# Patient Record
Sex: Female | Born: 1950
Health system: Southern US, Community
[De-identification: ages and names within clinical notes are randomized; demographics above are authoritative.]

## PROBLEM LIST (undated history)

## (undated) ENCOUNTER — Emergency Department (HOSPITAL_BASED_OUTPATIENT_CLINIC_OR_DEPARTMENT_OTHER): Admission: EM

## (undated) DIAGNOSIS — C4490 Unspecified malignant neoplasm of skin, unspecified: Secondary | ICD-10-CM

## (undated) DIAGNOSIS — K219 Gastro-esophageal reflux disease without esophagitis: Secondary | ICD-10-CM

## (undated) DIAGNOSIS — G250 Essential tremor: Secondary | ICD-10-CM

## (undated) DIAGNOSIS — F32A Depression, unspecified: Secondary | ICD-10-CM

## (undated) DIAGNOSIS — F419 Anxiety disorder, unspecified: Secondary | ICD-10-CM

## (undated) DIAGNOSIS — M810 Age-related osteoporosis without current pathological fracture: Secondary | ICD-10-CM

## (undated) DIAGNOSIS — K449 Diaphragmatic hernia without obstruction or gangrene: Secondary | ICD-10-CM

## (undated) DIAGNOSIS — R42 Dizziness and giddiness: Secondary | ICD-10-CM

## (undated) DIAGNOSIS — R252 Cramp and spasm: Secondary | ICD-10-CM

## (undated) DIAGNOSIS — K589 Irritable bowel syndrome without diarrhea: Secondary | ICD-10-CM

## (undated) DIAGNOSIS — M8580 Other specified disorders of bone density and structure, unspecified site: Secondary | ICD-10-CM

## (undated) DIAGNOSIS — T675XXA Heat exhaustion, unspecified, initial encounter: Secondary | ICD-10-CM

## (undated) DIAGNOSIS — M4800 Spinal stenosis, site unspecified: Secondary | ICD-10-CM

## (undated) DIAGNOSIS — T7840XA Allergy, unspecified, initial encounter: Secondary | ICD-10-CM

## (undated) DIAGNOSIS — K2270 Barrett's esophagus without dysplasia: Secondary | ICD-10-CM

## (undated) DIAGNOSIS — E785 Hyperlipidemia, unspecified: Secondary | ICD-10-CM

## (undated) DIAGNOSIS — M545 Low back pain, unspecified: Secondary | ICD-10-CM

## (undated) DIAGNOSIS — M5126 Other intervertebral disc displacement, lumbar region: Secondary | ICD-10-CM

## (undated) DIAGNOSIS — Z87440 Personal history of urinary (tract) infections: Secondary | ICD-10-CM

## (undated) DIAGNOSIS — M1990 Unspecified osteoarthritis, unspecified site: Secondary | ICD-10-CM

## (undated) DIAGNOSIS — Z9289 Personal history of other medical treatment: Secondary | ICD-10-CM

## (undated) DIAGNOSIS — K635 Polyp of colon: Secondary | ICD-10-CM

## (undated) DIAGNOSIS — M797 Fibromyalgia: Secondary | ICD-10-CM

## (undated) DIAGNOSIS — N8110 Cystocele, unspecified: Secondary | ICD-10-CM

## (undated) DIAGNOSIS — R251 Tremor, unspecified: Secondary | ICD-10-CM

## (undated) DIAGNOSIS — F329 Major depressive disorder, single episode, unspecified: Secondary | ICD-10-CM

## (undated) DIAGNOSIS — M720 Palmar fascial fibromatosis [Dupuytren]: Secondary | ICD-10-CM

## (undated) DIAGNOSIS — H04123 Dry eye syndrome of bilateral lacrimal glands: Secondary | ICD-10-CM

## (undated) DIAGNOSIS — D219 Benign neoplasm of connective and other soft tissue, unspecified: Secondary | ICD-10-CM

## (undated) DIAGNOSIS — M069 Rheumatoid arthritis, unspecified: Secondary | ICD-10-CM

## (undated) DIAGNOSIS — M5020 Other cervical disc displacement, unspecified cervical region: Secondary | ICD-10-CM

## (undated) HISTORY — DX: Heat exhaustion, unspecified, initial encounter: T67.5XXA

## (undated) HISTORY — DX: Personal history of other medical treatment: Z92.89

## (undated) HISTORY — DX: Age-related osteoporosis without current pathological fracture: M81.0

## (undated) HISTORY — DX: Anxiety disorder, unspecified: F41.9

## (undated) HISTORY — DX: Benign neoplasm of connective and other soft tissue, unspecified: D21.9

## (undated) HISTORY — DX: Palmar fascial fibromatosis (dupuytren): M72.0

## (undated) HISTORY — DX: Low back pain: M54.5

## (undated) HISTORY — DX: Unspecified malignant neoplasm of skin, unspecified: C44.90

## (undated) HISTORY — DX: Irritable bowel syndrome, unspecified: K58.9

## (undated) HISTORY — DX: Tremor, unspecified: R25.1

## (undated) HISTORY — DX: Low back pain, unspecified: M54.50

## (undated) HISTORY — DX: Cramp and spasm: R25.2

## (undated) HISTORY — DX: Cystocele, unspecified: N81.10

## (undated) HISTORY — DX: Hyperlipidemia, unspecified: E78.5

## (undated) HISTORY — DX: Dizziness and giddiness: R42

## (undated) HISTORY — DX: Gastro-esophageal reflux disease without esophagitis: K21.9

## (undated) HISTORY — DX: Rheumatoid arthritis, unspecified: M06.9

## (undated) HISTORY — DX: Allergy, unspecified, initial encounter: T78.40XA

## (undated) HISTORY — DX: Diaphragmatic hernia without obstruction or gangrene: K44.9

## (undated) HISTORY — DX: Other specified disorders of bone density and structure, unspecified site: M85.80

## (undated) HISTORY — DX: Dry eye syndrome of bilateral lacrimal glands: H04.123

## (undated) HISTORY — DX: Major depressive disorder, single episode, unspecified: F32.9

## (undated) HISTORY — DX: Unspecified osteoarthritis, unspecified site: M19.90

## (undated) HISTORY — DX: Other cervical disc displacement, unspecified cervical region: M50.20

## (undated) HISTORY — DX: Essential tremor: G25.0

## (undated) HISTORY — DX: Polyp of colon: K63.5

## (undated) HISTORY — DX: Fibromyalgia: M79.7

## (undated) HISTORY — DX: Barrett's esophagus without dysplasia: K22.70

## (undated) HISTORY — DX: Spinal stenosis, site unspecified: M48.00

## (undated) HISTORY — DX: Depression, unspecified: F32.A

## (undated) HISTORY — DX: Other intervertebral disc displacement, lumbar region: M51.26

## (undated) HISTORY — DX: Personal history of urinary (tract) infections: Z87.440

## (undated) MED FILL — Medication: Fill #1 | Status: CN

---

## 2000-05-12 HISTORY — PX: CERVICAL LAMINECTOMY: SHX94

## 2000-08-03 ENCOUNTER — Encounter: Payer: Self-pay | Admitting: Family Medicine

## 2000-08-03 ENCOUNTER — Encounter: Admission: RE | Admit: 2000-08-03 | Discharge: 2000-08-03 | Payer: Self-pay | Admitting: Family Medicine

## 2001-03-12 ENCOUNTER — Encounter: Admission: RE | Admit: 2001-03-12 | Discharge: 2001-03-12 | Payer: Self-pay | Admitting: Family Medicine

## 2001-03-12 ENCOUNTER — Encounter: Payer: Self-pay | Admitting: Family Medicine

## 2001-05-12 HISTORY — PX: ABDOMINAL HYSTERECTOMY: SHX81

## 2002-03-03 ENCOUNTER — Inpatient Hospital Stay (HOSPITAL_COMMUNITY): Admission: RE | Admit: 2002-03-03 | Discharge: 2002-03-05 | Payer: Self-pay | Admitting: Obstetrics and Gynecology

## 2002-03-03 ENCOUNTER — Encounter (INDEPENDENT_AMBULATORY_CARE_PROVIDER_SITE_OTHER): Payer: Self-pay

## 2003-03-06 ENCOUNTER — Other Ambulatory Visit: Admission: RE | Admit: 2003-03-06 | Discharge: 2003-03-06 | Payer: Self-pay | Admitting: Obstetrics and Gynecology

## 2003-10-06 ENCOUNTER — Ambulatory Visit (HOSPITAL_COMMUNITY): Admission: RE | Admit: 2003-10-06 | Discharge: 2003-10-06 | Payer: Self-pay | Admitting: Obstetrics and Gynecology

## 2005-08-04 ENCOUNTER — Other Ambulatory Visit: Admission: RE | Admit: 2005-08-04 | Discharge: 2005-08-04 | Payer: Self-pay | Admitting: Obstetrics and Gynecology

## 2006-03-31 ENCOUNTER — Encounter: Admission: RE | Admit: 2006-03-31 | Discharge: 2006-03-31 | Payer: Self-pay | Admitting: Cardiology

## 2006-05-12 HISTORY — PX: CHOLECYSTECTOMY: SHX55

## 2006-05-19 ENCOUNTER — Ambulatory Visit: Payer: Self-pay | Admitting: Gastroenterology

## 2006-05-21 ENCOUNTER — Ambulatory Visit: Payer: Self-pay | Admitting: Gastroenterology

## 2006-06-09 ENCOUNTER — Ambulatory Visit: Payer: Self-pay | Admitting: Gastroenterology

## 2006-06-12 ENCOUNTER — Ambulatory Visit: Payer: Self-pay | Admitting: Gastroenterology

## 2006-08-11 ENCOUNTER — Ambulatory Visit: Payer: Self-pay | Admitting: Gastroenterology

## 2006-08-18 ENCOUNTER — Ambulatory Visit: Payer: Self-pay | Admitting: Psychiatry

## 2006-08-25 ENCOUNTER — Ambulatory Visit: Payer: Self-pay | Admitting: Psychiatry

## 2006-09-02 ENCOUNTER — Ambulatory Visit: Payer: Self-pay | Admitting: Psychiatry

## 2006-09-09 ENCOUNTER — Ambulatory Visit: Payer: Self-pay | Admitting: Psychiatry

## 2006-09-17 ENCOUNTER — Ambulatory Visit: Payer: Self-pay | Admitting: Psychiatry

## 2006-09-30 ENCOUNTER — Ambulatory Visit: Payer: Self-pay | Admitting: Psychiatry

## 2006-10-12 ENCOUNTER — Ambulatory Visit: Payer: Self-pay | Admitting: Psychiatry

## 2006-10-26 ENCOUNTER — Ambulatory Visit: Payer: Self-pay | Admitting: Psychiatry

## 2006-11-09 ENCOUNTER — Ambulatory Visit: Payer: Self-pay | Admitting: Psychiatry

## 2006-12-07 ENCOUNTER — Ambulatory Visit: Payer: Self-pay | Admitting: Psychiatry

## 2008-07-05 DIAGNOSIS — K5900 Constipation, unspecified: Secondary | ICD-10-CM | POA: Insufficient documentation

## 2008-07-05 DIAGNOSIS — K2990 Gastroduodenitis, unspecified, without bleeding: Secondary | ICD-10-CM

## 2008-07-05 DIAGNOSIS — K8020 Calculus of gallbladder without cholecystitis without obstruction: Secondary | ICD-10-CM | POA: Insufficient documentation

## 2008-07-05 DIAGNOSIS — K449 Diaphragmatic hernia without obstruction or gangrene: Secondary | ICD-10-CM | POA: Insufficient documentation

## 2008-07-05 DIAGNOSIS — K219 Gastro-esophageal reflux disease without esophagitis: Secondary | ICD-10-CM

## 2008-07-05 DIAGNOSIS — K2970 Gastritis, unspecified, without bleeding: Secondary | ICD-10-CM | POA: Insufficient documentation

## 2008-07-11 ENCOUNTER — Ambulatory Visit: Payer: Self-pay | Admitting: Gastroenterology

## 2008-07-11 DIAGNOSIS — F419 Anxiety disorder, unspecified: Secondary | ICD-10-CM | POA: Insufficient documentation

## 2008-07-17 ENCOUNTER — Ambulatory Visit: Payer: Self-pay | Admitting: Gastroenterology

## 2008-07-17 ENCOUNTER — Encounter: Payer: Self-pay | Admitting: Gastroenterology

## 2008-07-18 ENCOUNTER — Encounter: Payer: Self-pay | Admitting: Gastroenterology

## 2008-07-20 ENCOUNTER — Telehealth: Payer: Self-pay | Admitting: Gastroenterology

## 2008-08-03 ENCOUNTER — Telehealth: Payer: Self-pay | Admitting: Gastroenterology

## 2008-09-15 ENCOUNTER — Telehealth: Payer: Self-pay | Admitting: Gastroenterology

## 2009-05-12 HISTORY — PX: ROTATOR CUFF REPAIR: SHX139

## 2009-07-02 ENCOUNTER — Encounter: Admission: RE | Admit: 2009-07-02 | Discharge: 2009-07-02 | Payer: Self-pay | Admitting: Rheumatology

## 2009-11-21 ENCOUNTER — Telehealth: Payer: Self-pay | Admitting: Gastroenterology

## 2009-12-14 ENCOUNTER — Telehealth: Payer: Self-pay | Admitting: Gastroenterology

## 2009-12-17 ENCOUNTER — Ambulatory Visit (HOSPITAL_COMMUNITY): Admission: RE | Admit: 2009-12-17 | Discharge: 2009-12-17 | Payer: Self-pay | Admitting: Gastroenterology

## 2009-12-20 ENCOUNTER — Encounter: Payer: Self-pay | Admitting: Gastroenterology

## 2009-12-24 ENCOUNTER — Ambulatory Visit: Payer: Self-pay | Admitting: Gastroenterology

## 2009-12-24 ENCOUNTER — Telehealth: Payer: Self-pay | Admitting: Gastroenterology

## 2010-04-25 ENCOUNTER — Ambulatory Visit (HOSPITAL_COMMUNITY)
Admission: RE | Admit: 2010-04-25 | Discharge: 2010-04-26 | Payer: Self-pay | Source: Home / Self Care | Attending: Specialist | Admitting: Specialist

## 2010-06-02 ENCOUNTER — Encounter: Payer: Self-pay | Admitting: Rheumatology

## 2010-06-11 NOTE — Progress Notes (Signed)
Summary: Triage  Phone Note Call from Patient Call back at Home Phone [phone]   Caller: Patient Call For: Dr. Jarold Motto Reason for Call: Talk to Nurse Summary of Call: pt. is having alot of acid reflux and trouble keeping her throat clear. She has taken Aciphex and Nexium and had side effects. Is there anything else that be prescribed? Initial call taken by: Karna Christmas,  November 21, 2009 9:36 AM  Follow-up for Phone Call        Line busy.  Lupita Leash Surface RN  November 21, 2009 9:44 AM  Lm for pt to call.  Lupita Leash Surface RN  November 21, 2009 10:37 AM  Pt was diag with rheumatoid arthritis in Feb.  She is now on prednisone 7.5 mg daily and MTX 20 mg weekly.  Having increase in reflux.  Feels that shehas to clear her throat alot.  Currently taking Zantac OTC 75 mg two times a day.  This helps sme but asks if there is something better that she can try.? She has tried numerous PPI's in the past.  Nexium, Aciphex, Zegerid, Prevacid, Dexilant.  All have either not helped or she had a reaction to the med.  has never tried protonix that she can remember. Follow-up by: Ashok Cordia RN,  November 21, 2009 11:56 AM  Additional Follow-up for Phone Call Additional follow up Details #1::        zantac 300mg  two times a day,prn liquid carafate...manometry abd ph probe needed... Additional Follow-up by: Mardella Layman MD FACG,  November 21, 2009 1:55 PM    Additional Follow-up for Phone Call Additional follow up Details #2::    Pt notified. Rx sent.    Lupita Leash Surface RN  November 21, 2009 3:33 PM  Manometry and 24 hr ph sch for Aug 1 at 9:00.  Does pt need to stop zantac and carafate? Follow-up by: Ashok Cordia RN,  November 22, 2009 12:50 PM  Additional Follow-up for Phone Call Additional follow up Details #3:: Details for Additional Follow-up Action Taken: 72h before on zantac and 24h on carafate Additional Follow-up by: Mardella Layman MD Clementeen Graham,  November 23, 2009 8:32 AM  New/Updated Medications: ZANTAC 300  MG TABS (RANITIDINE HCL) 1 by mouth two times a day CARAFATE 1 GM/10ML  SUSP (SUCRALFATE) 1 tbsp qid as needed Prescriptions: CARAFATE 1 GM/10ML  SUSP (SUCRALFATE) 1 tbsp qid as needed  #14 OZ x 3   Entered by:   Ashok Cordia RN   Authorized by:   Mardella Layman MD [facility]   Signed by:   Ashok Cordia RN on 11/21/2009   Method used:   Electronically to        [facility]  [address] [address] (retail[facility]       [address]       [place], [place]  [address]       Ph: [phone] or [phone]       Fax: [phone]   RxID:   [id number] ZANTAC 300 MG TABS (RANITIDINE HCL) 1 by mouth two times a day  #60 x 11   Entered by:   Ashok Cordia RN   Authorized by:   Mardella Layman MD [facility]   Signed by:   Ashok Cordia RN on 11/21/2009   Method used:   [facility]ronically [facility]        [facility]  [address] [phone][facility] (retail)       [address]  [place], [place]  [address]       Ph: [phone] or [phone]       Fax: [phone]   RxID:   [id number]   Appended Document: Triage LM for pt re date.  Instructions mailed to pt.  Appended Document: Triage Pt asks if manom/ph can be changed.  [birth year].  Proc resch to 12/17/09.  Pt notified.

## 2010-06-11 NOTE — Procedures (Signed)
Summary: manometry   Esophageal Manometry  Procedure date:  12/20/2009  Findings:      normal:   Esophageal manometry was completed on December 17, 2009. Salt are as follows:  #1 upper esophageal sphincter-there is normal coordination between pharyngeal contraction and cricopharyngeal relaxation.  #2-lower esophageal sphincter-mean pressure is normal at 30 mm of mercury with normal relaxation swallowing.  #3-esophageal motility-there are normally propagated peristaltic waves throughout the length of the esophagus both wet and dry swallows. Mean amplitude of duration as 183 mmHg. There is 100% peristalsis.  Assessment this a normal esophageal manometry without evidence of an esophageal motility disorder.

## 2010-06-11 NOTE — Progress Notes (Signed)
Summary: ? Meds  Phone Note Call from Patient Call back at Home Phone [phone]   Caller: Patient Call For: Dr. Jarold Motto Reason for Call: Talk to Nurse Details for Reason: ? Meds Summary of Call: Pt. has a question about taking her meds before her manometry on Monday. Please call. Initial call taken by: Schuyler Amor,  December 14, 2009 10:39 AM  Follow-up for Phone Call        Pt asks if she can take her paxil the morning of proc.  Per instructions pt should be NPO after midnight.  Pt instructed to hold meds until after proc. Follow-up by: Ashok Cordia RN,  December 14, 2009 10:48 AM

## 2010-06-11 NOTE — Progress Notes (Signed)
Summary: Results of Manometry and Ph study  Phone Note Outgoing Call   Summary of Call: Per Dr. Jarold Motto,  Manometry and 24 hr PH study normal.  No evidence that acid refulx is causing pt's symptoms.  Pt should follow up with PCP if problems contunue. Initial call taken by: Ashok Cordia RN,  December 24, 2009 4:43 PM  Follow-up for Phone Call        LM for pt to call.  Lupita Leash Surface RN  December 26, 2009 9:15 AM  Pt notified of resuts.  Pt states she is doing mubh better in the zantac 300 two times a day and carafate as needed. She will cont these meds. Follow-up by: Ashok Cordia RN,  December 26, 2009 12:04 PM

## 2010-06-11 NOTE — Procedures (Signed)
Summary: 24 Hr Ph study   pH Probe Study  Procedure date:  12/20/2009  Findings:      Transnasal:  Location: [facility]   24-hour pH chest was completed on August 11. This report with proximal and distal throat is entirely normal without any evidence of acid reflux in either probe in any position. DeMeester score is 3.2 normal less than 22. There are no reflux episodes longer than 2 minutes and the time the pH is less than 4 is normal in all positions. She has multiple recordings of belching but no correlation of symptoms and acid reflux occurrence.  Assessment there is no evidence whatsoever of acid reflux be involved in this patient's symptomatology. We'll return her the care of her primary care physician.

## 2010-07-23 LAB — CBC
Hemoglobin: 13.8 g/dL (ref 12.0–15.0)
MCV: 100.2 fL — ABNORMAL HIGH (ref 78.0–100.0)
Platelets: 212 10*3/uL (ref 150–400)
RDW: 13 % (ref 11.5–15.5)
WBC: 4.7 10*3/uL (ref 4.0–10.5)

## 2010-07-23 LAB — BASIC METABOLIC PANEL
BUN: 10 mg/dL (ref 6–23)
CO2: 30 mEq/L (ref 19–32)
Calcium: 9.2 mg/dL (ref 8.4–10.5)
Creatinine, Ser: 0.65 mg/dL (ref 0.4–1.2)
GFR calc non Af Amer: >60 mL/min (ref >60)
Glucose, Bld: 92 mg/dL (ref 70–99)

## 2010-09-27 NOTE — Discharge Summary (Signed)
NAME:  Angela Ray, Angela Ray                      ACCOUNT NO.:  [id number]   MEDICAL RECORD NO.:  [id number]                   PATIENT TYPE:  INP   LOCATION:  9307                                 FACILITY:  [facility]   PHYSICIAN:  Randye Lobo, M.D.                DATE OF BIRTH:  May 11, 1951   DATE OF ADMISSION:  03/03/2002  DATE OF DISCHARGE:  03/05/2002                                 DISCHARGE SUMMARY   ADMISSION DIAGNOSIS:  Symptomatic uterine leiomyomata.   DISCHARGE DIAGNOSES:  1. Symptomatic uterine leiomyomata.  2. Status post total abdominal hysterectomy.   SIGNIFICANT OPERATIONS AND PROCEDURES:  A total abdominal hysterectomy was  performed on March 03, 2002 at the [facility] under the direction of  Randye Lobo, M.D. and with the assistance of Dr. Lacretia Nicks. ________.   ADMISSION HISTORY AND PHYSICAL EXAMINATION:  The patient was a 60 year old,  gravida 0, Caucasian female who presented with heavy and frequent menses and  urinary urgency and the diagnosis of uterine leiomyomata by pelvic  ultrasound.  The patient wished for a definitive surgical treatment of the  fibroids.  Physical examination documented a uterine size which was 15 weeks  size and mobile with a large posterior lower uterine segment fibroid.  No  adnexal masses were appreciated.   HOSPITAL COURSE:  The patient was admitted for surgery on March 03, 2002.  The patient underwent a total abdominal hysterectomy under the direction of  Randye Lobo, M.D. and with the assistance of Dr. Lacretia Nicks. _________.  Physical  findings were an 8 cm posterior fundal subserosal fibroid which caused the  uterus to be retroverted.  There was also a 2 cm left fundal subserosal  fibroid appreciated.  The fallopian tubes and ovaries were normal  bilaterally.  On bilateral palpation of the upper abdomen, there appeared to  be some possible adhesions in the right upper quadrant just inferior to the  liver edge and these were not  moved.  The remainder of the intra-abdominal  organs were noted to be normal.   The patient's postoperative course was significant for nausea during the  immediate postop period which was partially attributed to the patient's  history of vertigo.  This did resolve with antiemetic therapy.   The remainder of the patient's postoperative course was unremarkable.  The  patient had no significant fevers during her hospitalization.  The patient  did advance to a regular diet which she was tolerating at the time of  discharge.  Her Foley catheter was removed on postoperative day #1 and she  was able to void spontaneously.  The patient had good pain control initially  with a morphine PCA which was converted over to Darvocet-N 100.  The patient  did use TED hose and PAS stockings during her hospitalization and was  ambulating well at the time of her discharge.   The incision remained clean, dry, and intact, and the  staples remained at  the time of discharge.   The patient's discharge hematocrit was noted to be 34.1% and she was  tolerating this well.  The final pathology report is pending at the time of  the patient's discharge.   The patient was found to be in good condition and ready for discharge on  postoperative day #2.    DISCHARGE INSTRUCTIONS:  1. Discharged to home.  2. Decreased activity for six weeks.  3. Prescriptions were given for Darvocet-N 100 one p.o. q.4-6 h. p.r.n. pain     and for Phenergan 25 mg p.o. q.6 h. p.r.n. nausea.  4. The patient will follow with a regular diet.  5. She will follow up in the office in four days for staple removal.  6. The patient will call if she experiences any fever, nausea and vomiting,     increased pain, incisional drainage or redness, or any other concern.                                               Randye Lobo, M.D.    BES/MEDQ  D:  03/05/2002  T:  03/07/2002  Job:  [id number]

## 2010-09-27 NOTE — Assessment & Plan Note (Signed)
[facility]                         GASTROENTEROLOGY OFFICE NOTE   NAME:Ray, Angela STAPLETON                   MRN:          [id number]  DATE:05/19/2006                            DOB:          06-22-1950    Angela Ray is a 60 year old white female, retired from [employer].  She is referred by Dr. Caryl Never for evaluation of nausea  and changing bowel habits.   I previously saw Angela Ray for abdominal pain, bloating and rather severe  constipation in June 2005.  At that time she had a negative colonoscopy  exam.  She has continued to be constipated since that time, but around  the death of aunt or on 04/09/2006, she developed nausea,  diarrhea, and rather classic panic attacks.  She is currently under the  care of a psychologist, and has been on Wellbutrin, Zoloft, is currently  on Paxil 20 mg a day.  She really denies depression, but has had rather  classic anxiety attacks with nausea, diarrhea, shakiness, tremors, and  feelings of impending doom.  Associated with this has been anorexia and  a 15-pound weight loss.  She has had some mild reflux symptoms.  Uses  p.r.n. Zantac, but denies dysphagia or any hepatobiliary complaints.  Over the last several days she has returned to her normal constipation  pattern.  She has never had melena, hematochezia, hematemesis, and she  denies abuse of alcohol, cigarettes or NSAIDS.  She does take lorazepam  0.5 mg at bedtime and p.r.n. promethazine.  She has had no known  infectious disease exposure, travel, and there are no sick family  members at home.   PAST MEDICAL HISTORY:  Remarkable for degenerative arthritis, previous  hysterectomy without oophorectomy, and back surgery for degenerative  spine disease.   FAMILY HISTORY:  Unremarkable in terms of known gastrointestinal  problems except for a vague history of ulcerative colitis in her mother.   MEDICATIONS:  1. Paxil 20 mg a day  with p.r.n. promethazine 25 mg.  2. Lorazepam 0.5 mg.  3. Zantac over the counter.   She has had multiple reactions to almost all ANALGESICS, ASPIRIN,  CEPHALEXIN, TETRACYCLINE, DECONGESTANTS.   SOCIAL HISTORY:  Patient is married and lives with her husband.  She has  a high school education.  She uses ethanol socially.   REVIEW OF SYSTEMS:  Remarkable for insomnia, palpitations associated  with her anxiety, pruritus, excessive urination, shortness of breath,  exertion and leg cramps at night.  She denies any other cardiovascular,  pulmonary, or neuropsychiatric problems.  She specifically denies  illusions or hallucinations.  Review of systems otherwise  noncontributory.   EXAMINATION:  She is a healthy appearing white female, appearing her  stated age, in no acute distress.  She is 6 feet tall and weighs 172 pounds.  Blood pressure is 92/64.  Pulse is 70 and regular.  I cannot  appreciate stigmata of chronic liver disease.  There is no thyromegaly noted.  CHEST:  Entirely clear to percussion and auscultation.  She appeared to  be in a regular rhythm without murmurs, gallops or rubs.  ABDOMINAL  EXAM:  Showed no distention, organomegaly, masses or  tenderness.  Her bowel sounds were entirely normal.  EXTREMITIES:  Unremarkable.  MENTAL STATUS:  Clear.  RECTAL:  Inspection of the rectum showed external hemorrhoidal tissue,  but no fissures or fistulae.  Rectal exam showed no masses or  tenderness.  There was soft stool in the rectal vault that was guaiac  negative.   ASSESSMENT:  I think that Angela Ray's symptoms are all directly  related to what sounds like a rather severe anxiety reaction and panic  attacks.  She has a long history of constipation, predominant irritable  bowel syndrome, and some of her symptoms may be related to some of her  recent medications.  I think it is unlikely that she has any other  definable gastrointestinal disorder at this time.    RECOMMENDATIONS:  1. Check abdominal ultrasound exam.  2. Trial of Nexium 40 mg q. a.m. 30 minutes before breakfast.  3. Discontinue lorazepam and try Klonopin 0.5 mg twice a day.  4. Continue Paxil therapy as tolerated.  5. GI followup in several weeks' time.  If she is still symptomatic,      we will probably consider endoscopic exam.     Angela Rea. Jarold Motto, MD, Caleen Essex, FAGA  Electronically Signed    DRP/MedQ  DD: 05/19/2006  DT: 05/19/2006  Job #: [id number]   cc:   Evelena Peat, M.D.  Gloriajean Dell. Andrey Campanile, M.D.

## 2010-09-27 NOTE — Assessment & Plan Note (Signed)
[facility]                         GASTROENTEROLOGY OFFICE NOTE   NAME:Ray, Angela STRAUCH                   MRN:          [id number]  DATE:08/11/2006                            DOB:          12-12-1950    PROBLEM LIST:  1. GERD.  2. Constipation.  3. Nausea.  4. Anxiety.   HISTORY:  Angela Ray comes in today for followup.  She was last seen by Dr.  Jarold Ray in January of 2008 and at that time was having a lot of  problem with nausea and altered bowel habits, with constipation and  occasional episodes of diarrhea.  She was also having fairly classic  panic attack symptoms.  She had been found to have cholelithiasis and  was in the process of being evaluated by surgery for possible  cholecystectomy.  From a GI standpoint, it was felt that she had  irritable bowel with chronic constipation and that a lot of her other  symptoms were likely related to anxiety.  She was tried on a trial of  Nexium and Klonopin and continued on Paxil.  She has subsequently  undergone cholecystectomy with Dr. Johna Ray which was done approximately  a month ago.  She did well post cholecystectomy, has not developed any  difficulty with post cholecystectomy diarrhea, etc., In fact says she is  still extremely constipated.  She tried the Nexium but feels that it  makes her very nauseated and also caused her an ulceration in taste  sensation and an irritation of her taste buds.  She stopped the Nexium  and has not been using a PPI, has been using over-the-counter Zantac  b.i.d. but says she is still having reflux symptoms.  She had been on  Aciphex several months ago and said that that did work.   As far as her constipation, she says she has not had a bowel movement in  the past 8 days and that she is the queen of constipation.  She had  used MiraLax previously but is not using it regularly.  She has been  afraid to get addicted to a laxative.  She eats a very high fiber  diet  in addition to pushing fluids and using Metamucil.   The patient is still having problems with anxiety.  She had tried to  wean off of her clonazepam at the advice of her primary care Angela Ray  but had a panic attack a few days ago and is now back on b.i.d.  Klonopin.  She is asking for advice regarding her anxiety.   CURRENT MEDICATIONS:  1. Paroxetine 20 mg daily.  2. Clonazepam 0.5 b.i.d.  3. Chlorhexidine mouthwash as needed.  4. Zantac b.i.d.   ALLERGIES:  MULTIPLE.  SEE PREVIOUS NOTES.   PHYSICAL EXAMINATION:  Well-developed white female in no acute distress.  She is talkative and somewhat anxious.  Weight is 166, blood pressure  124/80, pulse is 76.  HEENT:  Normocephalic and atraumatic.  EOMI.  PERRLA.  Sclerae  anicteric.  CARDIOVASCULAR:  Regular rate and rhythm with S1 and S2, no murmurs,  rubs or gallops.  PULMONARY:  Clear to A&P.  ABDOMEN:  Soft and nontender.  Her incisional ports are healing.  Bowel  sounds are active.   IMPRESSION:  1. Nausea possibly related to Nexium though this may also be a      function of her anxiety.  2. Taste alteration, again possible side effect of Nexium.  3. GERD.  4. Constipation/IBS.  5. Anxiety/panic attacks.   PLAN:  1. Discontinue Nexium and switch to Aciphex 20 mg p.o. daily.  She was      given a prescription and samples today.  2. Trial of Amitiza 24 mcg b.i.d. for her constipation.  If she does      well with this she can stay on it.  Otherwise would switch back to      MiraLax 17 grams every day.  3. Referral to Dr. Dellia Ray for counseling and I advised her to      continue the clonazepam.  She may try to wean to a once daily dose      and to continue her Paxil.      Mike Gip, PA-C  Electronically Signed      Vania Rea. Angela Motto, MD, Caleen Essex, FAGA  Electronically Signed   AE/MedQ  DD: 08/14/2006  DT: 08/14/2006  Job #: [id number]

## 2010-09-27 NOTE — Op Note (Signed)
NAME:  Angela Ray, Angela Ray                      ACCOUNT NO.:  [id number]   MEDICAL RECORD NO.:  [id number]                   PATIENT TYPE:  INP   LOCATION:  9399                                 FACILITY:  [facility]   PHYSICIAN:  Randye Lobo, M.D.                DATE OF BIRTH:  1950-10-05   DATE OF PROCEDURE:  03/03/2002  DATE OF DISCHARGE:                                 OPERATIVE REPORT   PREOPERATIVE DIAGNOSES:  Symptomatic uterine leiomyomata.   POSTOPERATIVE DIAGNOSES:  Symptomatic uterine leiomyomata.   PROCEDURE:  Total abdominal hysterectomy.   SURGEON:  Randye Lobo, M.D.   ASSISTANTLuvenia Redden, MD   ANESTHESIA:  MAC 4, general endotracheal.   IV FLUIDS:  2300 cc Ringer's lactate.   ESTIMATED BLOOD LOSS:  100 cc.   URINE OUTPUT:  300 cc.   COMPLICATIONS:  None.   INDICATIONS FOR PROCEDURE:  The patient was a 60 year old gravida 107  Caucasian female who presented to the office with heavy and frequent menses  and urinary urgency and a diagnosis of a pelvic mass which on CT scan and  pelvic ultrasound documented uterine fibroids, the largest measuring 8.5 cm  in diameter.  The ovaries were not visualized at the time of the imaging  studies.  The patient declined any future childbearing and she wished for  definitive surgical evaluation and treatment of the uterine fibroids and she  wished to retain the adnexal structures if they were identified to be normal  at the time of the surgery.  The patient chose to proceed with her surgery  after the risks, benefits, and alternatives were discussed with her.   FINDINGS:  Laparotomy demonstrated a uterus with an 8 cm posterior fundal  subserosal fibroid which was resting deep in the cul-de-sac region.  There  was also a 2 cm left fundal subserosal fibroid.  The fallopian tubes and  ovaries were normal bilaterally.  The appendix was visualized and noted to  be normal.  The liver, gallbladder, kidneys, and periaortic  regions were  palpably normal.  There appeared to be some possible adhesions in the right  upper quadrant just inferior to the liver edge.  These were not lysed.   SPECIMENS:  The uterus was sent to pathology.   PROCEDURE:  The patient was identified in the preoperative area and was  taken to the operating room with an IV in place and after receiving  cefazolin 1 g intravenously for antibiotic prophylaxis.  The patient did  receive ted hose and PAS stockings for DVT prophylaxis.  In the operating  room the patient was placed in the supine position on the operating room  table and the anesthesia team performed general endotracheal anesthesia.  The patient's abdomen and vagina were then sterilely prepped and a Foley  catheter was placed inside the bladder.  The patient was then sterilely  draped.   The procedure began  with a vertical midline incision created sharply with a  scalpel.  This was carried down to the rectus fascia using monopolar  cautery.  The fascia was then scored in a vertical fashion using a scalpel  and the incision was extended with a combination of sharp dissection with a  scalpel and a Mayo scissors and with monopolar cautery.  The rectus muscles  were then divided bluntly and the parietal peritoneum was identified and  grasped with two hemostat clamps and entered sharply with the Metzenbaum  scissors.  The incision in the peritoneum was extended cranially and  caudally.   An exploration of the pelvic and abdominal organs was performed and the  findings are as noted above.  A self retaining retractor was placed in the  peritoneal cavity and the bowel was packed into the upper abdomen using  moistened lap pads.  The procedure began by placing long Kelly clamps across  the adnexal structures bilaterally.  The round ligaments were then isolated,  suture ligated with transfixing sutures of 0 Vicryl and divided using  monopolar cautery.  The broad ligaments were then  opened using monopolar  cautery.  A window was created through the posterior leaf of the broad  ligament on each side and a Heaney clamp was placed across the fallopian  tube and utero-ovarian ligament.  This was then divided sharply on each side  and a free tie of 0 Vicryl followed by a suture ligature of the same was  placed.  Hemostasis was excellent bilaterally.  The bladder flap was then  created sharply with the Metzenbaum scissors.  The uterine arteries were  skeletonized bilaterally and curved Heaney clamps were placed across the  uterine arteries bilaterally.  They were then sharply divided and suture  ligated with 0 Vicryl suture for excellent hemostasis.  The remainder of the  cardinal and the uterosacral ligaments were then sequentially clamped,  sharply divided, and suture ligated with transfixing sutures of 0 Vicryl.  Curved Heaney clamps were then placed at the top of the vagina just below  the level of the cervix and the specimen was sharply excised and sent to  pathology.  Transfixing sutures of 0 Vicryl were then placed for angle  sutures and an additional figure-of-eight suture of 0 Vicryl was placed in  the midline of the vagina to close the vaginal cuff.   Irrigation of the pelvis was performed at this time and the operating sites  were examined and found to be hemostatic.  The abdomen was therefore closed.   The parietal peritoneum was closed with a running suture of 0 Vicryl.  The  fascia was closed with a running suture of 0 PDS.  The subcutaneous tissue  was irrigated and suctioned and found to be hemostatic.  Interrupted sutures  of 3-0 plain were placed in the subcutaneous layer and staples were placed  on the skin followed by a pressure bandage.   The patient was cleansed of remaining Betadine and was awakened and  extubated and sent to the recovery room in stable and awake condition. There were no complications to the procedure.  All needle, instrument,   sponge counts were correct.                                               Randye Lobo, M.D.    BES/MEDQ  D:  03/03/2002  T:  03/03/2002  Job:  [id number]

## 2010-09-27 NOTE — H&P (Signed)
NAME:  Angela Ray, Angela Ray                      ACCOUNT NO.:  [id number]   MEDICAL RECORD NO.:  [id number]                   PATIENT TYPE:  INP   LOCATION:  NA                                   FACILITY:  [facility]   PHYSICIAN:  Randye Lobo, M.D.                DATE OF BIRTH:  [birth year]   DATE OF ADMISSION:  03/03/2002  DATE OF DISCHARGE:                                HISTORY & PHYSICAL   CHIEF COMPLAINT:  Heavy menstrual periods.   HISTORY OF PRESENT ILLNESS:  The patient is a 60 year old, gravida 0,  Caucasian female, who presented to the office on January 27, 2002, with a  diagnosis of symptomatic fibroids by her primary care physician, Tammy R.  Collins Scotland, M.D.  The patient had reported heavy menses occurring every 21 days  and lasting for seven days at a time.  The patient also had reported urgency  with some urge incontinence.  The patient also reported dysmenorrhea.  Her  physical exam by her primary care physician, she was noted to have a pelvic  mass.  CT scan performed on January 21, 2002, documented enlargement of  the uterus.  The remainder of the pelvic and abdominal ultrasound was  unremarkable.  The patient did have a pelvic ultrasound performed on  January 24, 2002, in follow-up to the CT scan, at which time the uterus  was noted to be enlarged with a fibroid measuring 8.5 x 6.1 x 7.3 cm along  the posterior wall.  There were two smaller fibroids of the fundus, as the  larger measuring 2.5 cm.  The ovaries were not visualized well.  The kidneys  appeared to be unremarkable.   The patient declines any future childbearing and she wishes for definitive  surgical evaluation and treatment of the uterine fibroids.   PAST OBSTETRICAL AND GYNECOLOGICAL HISTORY:  The patient has no history of  any abnormal Pap smears and her last Pap smear was performed in September of  2003 and was normal.  The patient uses a vasectomy as her form of  contraception.  The patient's last  mammogram was performed on January 27, 2002, and was noted to be within normal limits.   PAST MEDICAL HISTORY:  1. Gastrointestinal reflux.  2. Recurrent vertigo.   PAST SURGICAL HISTORY:  Status post cervical laminectomy of C5 and C6 on  July 12, 2000.   MEDICATIONS:  1. Paxil 10 mg p.o. q.d.  2. Prilosec 20 mg p.o. q.d.   ALLERGIES:  The patient reports a sensitivity to CODEINE, VICODIN, PERCODAN,  and PERCOCET.  She is also unable to take ASPIRIN, which causes tinnitus and  dyspnea.   SOCIAL HISTORY:  The patient is retired from [employer].  Her husband is disabled  with back problems.  The patient denies the use of tobacco.  She uses  alcohol approximately two times per month.  She does not use illicit drugs.  FAMILY HISTORY:  Diabetes in the patient's grandparents.  Heart disease in  the patient's mother, father, and grandparents.  Hypertension in the  patient's grandparents.  Prostate cancer in the patient's father.   REVIEW OF SYMPTOMS:  The patient denies any symptoms of stress urinary  incontinence.  She denies dysuria.  She does report problems with chronic  constipation.  She has had previous normal thyroid function studies.   PHYSICAL EXAMINATION:  VITAL SIGNS:  The blood pressure is 120/60.  WEIGHT:  192 pounds.  HEIGHT:  6 feet.  GENERAL APPEARANCE:  The patient is in no acute distress.  HEENT:  Normocephalic and atraumatic.  LUNGS:  Clear to auscultation bilaterally.  HEART:  S1 and S2 with a regular rate and rhythm.  ABDOMEN:  Exam demonstrates a mass which extends from the pubic symphysis to  halfway up to the umbilicus.  The mass is mobile and nontender.  No  hepatosplenomegaly is appreciated.  PELVIC:  Normal external genitalia and inguinal lymph nodes.  The vaginal  and cervix are without lesions.  Bimanual exam demonstrates a 15-week size,  mobile uterus with a large posterior lower uterine segment fibroid.  The  uterus is nontender.  No adnexal masses  were appreciated.   IMPRESSION:  The patient is a 60 year old, gravida 0, Caucasian female with  symptomatic uterine leiomyomata and a decision for definitive surgical  treatment.  The patient does decline any future childbearing.   PLAN:  Total abdominal hysterectomy with possible bilateral salpingo-  oophorectomy on March 03, 2002.  If the ovaries are normal at the time of  surgery, the patient will retain her ovaries.  The risks, benefits, and  alternatives have been discussed with the patient and her husband, who wish  to proceed.                                                Randye Lobo, M.D.    BES/MEDQ  D:  03/02/2002  T:  03/02/2002  Job:  [id number]

## 2010-11-20 ENCOUNTER — Other Ambulatory Visit: Payer: Self-pay | Admitting: Obstetrics and Gynecology

## 2011-03-12 ENCOUNTER — Other Ambulatory Visit: Payer: Self-pay | Admitting: Gastroenterology

## 2011-03-21 ENCOUNTER — Other Ambulatory Visit: Payer: Self-pay | Admitting: *Deleted

## 2011-03-21 MED ORDER — SUCRALFATE 1 GM/10ML PO SUSP: 1.0000 g | Freq: Four times a day (QID) | ORAL | Status: DC

## 2011-06-06 ENCOUNTER — Institutional Professional Consult (permissible substitution): Payer: Self-pay | Admitting: Cardiovascular Disease

## 2011-06-24 ENCOUNTER — Encounter: Payer: Self-pay | Admitting: Cardiovascular Disease

## 2011-06-24 ENCOUNTER — Ambulatory Visit (INDEPENDENT_AMBULATORY_CARE_PROVIDER_SITE_OTHER): Payer: 59 | Admitting: Cardiovascular Disease

## 2011-06-24 DIAGNOSIS — R079 Chest pain, unspecified: Secondary | ICD-10-CM

## 2011-06-24 NOTE — Progress Notes (Signed)
History of Present Illness: 61 yo WF with history of GERD, anxiety, vertigo here today as a self referral for cardiac evaluation. She has been followed in the past by Dr. Viann Fish for workup of palpitations, dyspnea and atypical chest pains. The last clinic note I have from Dr. Donnie Aho is from 04/30/2006 and it shows that she wore an event monitor and was only seen to have sinus tachycardia. Echocardiogram in 2007 is reported as normal. Exercise stress test in 2007 is reported as normal. It was felt that her symptoms at that time were mostly anxiety related. Most recent lipids from February 2013 with total cholesterol of 196, TG 107, HDL 45, LDL 130.   She tells me today that she has been having left arm pains with radiation into her left chest, shoulder and neck. This can happen at rest or with exertion. She cannot think of anything that brings this on. No associated dizziness, diaphoresis, SOB. She has a recent diagnosis of RA and has been under much stress taking care of her elderly parents.   Her primary care is Shaune Pollack [facility], [place])  Past Medical History  Diagnosis Date  . GERD (gastroesophageal reflux disease)   . Vertigo   . Anxiety   . Hiatal hernia   . Rheumatoid arthritis   . Depression   . Skin cancer     Past Surgical History  Procedure Date  . Rotator cuff repair 2011    Right  . Cervical laminectomy 2002    C5/C6  . Abdominal hysterectomy 2003  . Cholecystectomy 2008    Current Outpatient Prescriptions  Medication Sig Dispense Refill  . augmented betamethasone dipropionate (DIPROLENE-AF) 0.05 % cream As directed      . busPIRone (BUSPAR) 15 MG tablet 1 tab two times a day      . cetirizine (ZYRTEC) 10 MG tablet Take 10 mg by mouth. As needed      . clonazePAM (KLONOPIN) 0.5 MG tablet 1 tab three times a day      . ENBREL SURECLICK 50 MG/ML injection 1 injection 1 a week on Mon...      . folic acid (FOLVITE) 1 MG tablet 3 tabs daily      . ibuprofen  (ADVIL,MOTRIN) 800 MG tablet Take 800 mg by mouth every 8 (eight) hours as needed.      . methotrexate (RHEUMATREX) 2.5 MG tablet 5 tab on sat -5 tabs on sun      . PARoxetine (PAXIL) 40 MG tablet 1 tab daily      . polyethylene glycol (MIRALAX / GLYCOLAX) packet Take 17 g by mouth daily. As needed      . promethazine (PHENERGAN) 25 MG tablet Take 1/2 tab as needed      . ranitidine (ZANTAC) 300 MG capsule Take 300 mg by mouth every evening.      . sucralfate (CARAFATE) 1 GM/10ML suspension Take 10 mLs (1 g total) by mouth 4 (four) times daily.  420 mL  3  . tolterodine (DETROL LA) 4 MG 24 hr capsule Take 4 mg by mouth daily.      . VOLTAREN 1 % GEL As directed        Allergies  Allergen Reactions  . Aspirin     REACTION: unspecified  . Bupropion Hcl     REACTION: unspecified  . Cephalexin     REACTION: unspecified  . Codeine Phosphate     REACTION: unspecified  . Doxycycline Hyclate  REACTION: unspecified  . Hydrocodone-Acetaminophen     REACTION: unspecified  . Oxycodone-Acetaminophen     REACTION: unspecified  . Sertraline Hcl     REACTION: unspecified    History   Social History  . Marital Status: Married    Spouse Name: [name]    Number of Children: 0  . Years of Education: [name]   Occupational History  . Retired    Social History Main Topics  . Smoking status: Never Smoker   . Smokeless tobacco: Not on file  . Alcohol Use: No  . Drug Use: No  . Sexually Active: Not on file   Other Topics Concern  . Not on file   Social History Narrative  . No narrative on file    Family History  Problem Relation Age of Onset  . Coronary artery disease Father     CABG 2002, followd by Dr, Myrtis Ser  . Atrial fibrillation Mother   . Coronary artery disease      Review of Systems:  As stated in the HPI and otherwise negative.   BP 105/71  Pulse 78  Ht 6' (1.829 m)  Wt 186 lb (84.369 kg)  BMI 25.23 kg/m2  Physical Examination: General: Well developed, well  nourished, NAD HEENT: OP clear, mucus membranes moist SKIN: warm, dry. No rashes. Neuro: No focal deficits Musculoskeletal: Muscle strength 5/5 all ext Psychiatric: Mood and affect normal Neck: No JVD, no carotid bruits, no thyromegaly, no lymphadenopathy. Lungs:Clear bilaterally, no wheezes, rhonci, crackles Cardiovascular: Regular rate and rhythm. No murmurs, gallops or rubs. Abdomen:Soft. Bowel sounds present. Non-tender.  Extremities: No lower extremity edema. Pulses are 2 + in the bilateral DP/PT.  EKG: NSR, rate 71 bpm.

## 2011-06-24 NOTE — Assessment & Plan Note (Signed)
Her chest pain is mostly atypical. Her cardiac risk factors are borderline elevated lipids and a family history of CAD. I will arrange an exercise treadmill stress test and an echocardiogram to assess her LVEF and exclude structural heart disease.

## 2011-06-24 NOTE — Patient Instructions (Signed)
Your physician recommends that you schedule a follow-up appointment in: 3-4 weeks.   Your physician has requested that you have an exercise tolerance test. For further information please visit [email or web address]. Please also follow instruction sheet, as given.   Your physician has requested that you have an echocardiogram. Echocardiography is a painless test that uses sound waves to create images of your heart. It provides your doctor with information about the size and shape of your heart and how well your heart's chambers and valves are working. This procedure takes approximately one hour. There are no restrictions for this procedure.    

## 2011-07-01 ENCOUNTER — Ambulatory Visit (HOSPITAL_COMMUNITY): Payer: 59 | Attending: Cardiology

## 2011-07-01 ENCOUNTER — Other Ambulatory Visit: Payer: Self-pay

## 2011-07-01 DIAGNOSIS — I080 Rheumatic disorders of both mitral and aortic valves: Secondary | ICD-10-CM | POA: Insufficient documentation

## 2011-07-01 DIAGNOSIS — I079 Rheumatic tricuspid valve disease, unspecified: Secondary | ICD-10-CM | POA: Insufficient documentation

## 2011-07-01 DIAGNOSIS — I379 Nonrheumatic pulmonary valve disorder, unspecified: Secondary | ICD-10-CM | POA: Insufficient documentation

## 2011-07-01 DIAGNOSIS — R072 Precordial pain: Secondary | ICD-10-CM

## 2011-07-03 ENCOUNTER — Telehealth: Payer: Self-pay | Admitting: Cardiovascular Disease

## 2011-07-03 NOTE — Telephone Encounter (Signed)
Pt rtn call re echo results from yesterday, pls call after 10a

## 2011-07-03 NOTE — Telephone Encounter (Signed)
F/U  Patient returning nurses call, she can be reached at [phone]

## 2011-07-03 NOTE — Telephone Encounter (Signed)
Spoke with pt and reviewed echo results.  

## 2011-07-04 ENCOUNTER — Ambulatory Visit (INDEPENDENT_AMBULATORY_CARE_PROVIDER_SITE_OTHER): Payer: 59 | Admitting: Cardiovascular Disease

## 2011-07-04 DIAGNOSIS — R079 Chest pain, unspecified: Secondary | ICD-10-CM

## 2011-07-04 NOTE — Progress Notes (Signed)
Exercise Treadmill Test  Pre-Exercise Testing Evaluation Rhythm: normal sinus  Rate: 66   PR:  .16 QRS:  .08  QT:  .45 QTc: .47     Test  Exercise Tolerance Test Ordering MD: Melene Muller, MD  Interpreting MD:  Melene Muller, MD  Unique Test No: 1  Treadmill:  1  Indication for ETT: chest pain - rule out ischemia  Contraindication to ETT: No   Stress Modality: exercise - treadmill  Cardiac Imaging Performed: non   Protocol: standard Bruce - maximal  Max BP:  158/80  Max MPHR (bpm):  162 85% MPR (bpm):  138  MPHR obtained (bpm):  155 % MPHR obtained:  94%  Reached 85% MPHR (min:sec):  4:17 Total Exercise Time (min-sec):  7:00  Workload in METS:  8.5 Borg Scale: 15  Reason ETT Terminated:  fatigue    ST Segment Analysis At Rest: Normal ST segments.  With Exercise: non-specific ST changes  Other Information Arrhythmia:  No Angina during ETT:  absent (0) Quality of ETT:  non-diagnostic  ETT Interpretation:  normal - no evidence of ischemia by ST analysis  Comments: Pt exercised for 7 minutes and had no chest pain or SOB. She stopped because of fatigue. No EKG evidence of ischemia.   Recommendations: No further cardiac workup at this time. Of note, echocardiogram this week with normal LV function and normal wall motion.

## 2011-08-04 ENCOUNTER — Encounter: Payer: Self-pay | Admitting: Gastroenterology

## 2012-11-03 ENCOUNTER — Other Ambulatory Visit: Payer: Self-pay | Admitting: Dermatology

## 2012-12-16 ENCOUNTER — Encounter: Payer: Self-pay | Admitting: Obstetrics and Gynecology

## 2012-12-16 ENCOUNTER — Ambulatory Visit (INDEPENDENT_AMBULATORY_CARE_PROVIDER_SITE_OTHER): Payer: 59 | Admitting: Obstetrics and Gynecology

## 2012-12-16 ENCOUNTER — Telehealth: Payer: Self-pay | Admitting: Obstetrics and Gynecology

## 2012-12-16 VITALS — BP 122/74 | HR 64 | Ht 71.0 in | Wt 179.5 lb

## 2012-12-16 DIAGNOSIS — Z0000 Encounter for general adult medical examination without abnormal findings: Secondary | ICD-10-CM

## 2012-12-16 DIAGNOSIS — R159 Full incontinence of feces: Secondary | ICD-10-CM | POA: Insufficient documentation

## 2012-12-16 DIAGNOSIS — N3946 Mixed incontinence: Secondary | ICD-10-CM

## 2012-12-16 DIAGNOSIS — Z01419 Encounter for gynecological examination (general) (routine) without abnormal findings: Secondary | ICD-10-CM

## 2012-12-16 DIAGNOSIS — R1900 Intra-abdominal and pelvic swelling, mass and lump, unspecified site: Secondary | ICD-10-CM

## 2012-12-16 LAB — POCT URINALYSIS DIPSTICK
Blood, UA: NEGATIVE
Ketones, UA: NEGATIVE
Nitrite, UA: NEGATIVE
Urobilinogen, UA: NEGATIVE

## 2012-12-16 MED ORDER — TOLTERODINE TARTRATE ER 4 MG PO CP24: 4.0000 mg | ORAL_CAPSULE | Freq: Every day | ORAL | Status: DC

## 2012-12-16 NOTE — Patient Instructions (Signed)

## 2012-12-16 NOTE — Progress Notes (Signed)
Patient ID: Angela Ray, female   DOB: 1950-08-23, 62 y.o.   MRN: [id number]                            PCP - Dr. Kevan Ny 62 y.o.   Married    Caucasian   female   G0P0   here for annual exam.    Patient seeing a new physician, Dr. Kellie Simmer[place] in [place],  regarding joint issues.  May not have rheumatoid arthritis and instead have fibromyalgia.   Has had this diagnosis for three years and was not getting better.  Does have osteoarthritis.  Has spinal stenosis. Off meds for rheumatoid arthritis for 6 weeks and already feeling better.   Decreased sex drive.  No sexually active very often - patient and partner choice. No vaginal dryness or irritation.  Wants to continue Detrol LA 4 mg foe overactive bladder.  Also having leakage with coughing, laughing.  Wears a pad.  No prior treatment for this.  Sees Dr. Nicholas Lose for dermatology checks regularly.  Patient's last menstrual period was 02/09/2002.          Sexually active: no  The current method of family planning is status post hysterectomy.    Exercising: no Last mammogram:  11/2012 WUJ:WJXBJ Last pap smear: 11/2010 wnl History of abnormal pap: no Smoking: no Alcohol: no Last colonoscopy: unsure of date but sees Dr. Jarold Motto with [facility]      last Bone Density: 11/2011 - normal.  Left hip and spine T scores each  -0.5  Last tetanus shot: up to date with PCP--Dr. Shaune Pollack Last cholesterol check: 2014 wnl  Hgb:     PCP           Urine: Neg   Family History  Problem Relation Age of Onset  . Coronary artery disease Father     CABG 2002, followd by Dr, Myrtis Ser  . Prostate cancer Father   . Bladder Cancer Father   . Skin cancer Father   . Atrial fibrillation Mother   . Multiple myeloma Mother   . Skin cancer Mother   . Coronary artery disease    . Diabetes Maternal Grandmother   . Hypertension Maternal Grandmother   . Diabetes Maternal Grandfather   . Hypertension Maternal Grandfather   . Osteoarthritis Paternal Grandmother      Patient Active Problem List   Diagnosis Date Noted  . Chest pain 06/24/2011  . ANXIETY 07/11/2008  . GERD 07/05/2008  . GASTRITIS 07/05/2008  . HIATAL HERNIA 07/05/2008  . CONSTIPATION 07/05/2008  . GALLSTONES 07/05/2008    Past Medical History  Diagnosis Date  . GERD (gastroesophageal reflux disease)   . Vertigo   . Anxiety   . Hiatal hernia   . Rheumatoid arthritis(714.0)   . Depression   . Skin cancer   . Fibroid   . Osteoporosis     Past Surgical History  Procedure Laterality Date  . Rotator cuff repair  2011    Right  . Cervical laminectomy  2002    C5/C6  . Cholecystectomy  2008  . Abdominal hysterectomy  2003    TAH still has ovaries--Dr. Edward Jolly    Allergies: Aciphex; Aspirin; Bupropion hcl; Cephalexin; Codeine; Codeine phosphate; Doxycycline hyclate; Esomeprazole magnesium; Hydrocodone-acetaminophen; Oxycodone-acetaminophen; and Sertraline hcl  Current Outpatient Prescriptions  Medication Sig Dispense Refill  . augmented betamethasone dipropionate (DIPROLENE-AF) 0.05 % cream As directed      . busPIRone (BUSPAR) 15  MG tablet 1 tab two times a day      . cetirizine (ZYRTEC) 10 MG tablet Take 10 mg by mouth. As needed      . clonazePAM (KLONOPIN) 0.5 MG tablet 1 tab three times a day      . ibuprofen (ADVIL,MOTRIN) 800 MG tablet Take 800 mg by mouth every 8 (eight) hours as needed.      Marland Kitchen PARoxetine (PAXIL) 40 MG tablet 1 tab daily      . polyethylene glycol (MIRALAX / GLYCOLAX) packet Take 17 g by mouth daily. As needed      . ranitidine (ZANTAC) 300 MG capsule Take 300 mg by mouth every evening.      . tolterodine (DETROL LA) 4 MG 24 hr capsule Take 4 mg by mouth daily. Needs name brand only--generic does not work for patient.      . VOLTAREN 1 % GEL As directed      . ENBREL SURECLICK 50 MG/ML injection 1 injection 1 a week on Mon...      . folic acid (FOLVITE) 1 MG tablet 3 tabs daily      . methotrexate (RHEUMATREX) 2.5 MG tablet 5 tab on sat -5 tabs  on sun      . predniSONE (DELTASONE) 10 MG tablet Take 1 tablet by mouth as needed.      . promethazine (PHENERGAN) 25 MG tablet Take 1/2 tab as needed      . sucralfate (CARAFATE) 1 GM/10ML suspension Take 10 mLs (1 g total) by mouth 4 (four) times daily.  420 mL  3   No current facility-administered medications for this visit.    ROS: Pertinent items are noted in HPI.  Social Hx:  Married.  Retired.    Exam:    BP 122/74  Pulse 64  Ht 5\' 11"  (1.803 m)  Wt 179 lb 8 oz (81.421 kg)  BMI 25.05 kg/m2  LMP 02/09/2002   Wt Readings from Last 3 Encounters:  12/16/12 179 lb 8 oz (81.421 kg)  06/24/11 186 lb (84.369 kg)  07/11/08 174 lb 8 oz (79.153 kg)     Ht Readings from Last 3 Encounters:  12/16/12 5\' 11"  (1.803 m)  06/24/11 6' (1.829 m)  07/11/08 6' (1.829 m)    General appearance: alert, cooperative and appears stated age Head: Normocephalic, without obvious abnormality, atraumatic Neck: no adenopathy, supple, symmetrical, trachea midline and thyroid not enlarged, symmetric, no tenderness/mass/nodules Lungs: clear to auscultation bilaterally Breasts: Inspection negative, No nipple retraction or dimpling, No nipple discharge or bleeding, No axillary or supraclavicular adenopathy, Normal to palpation without dominant masses Heart: regular rate and rhythm Abdomen: infraumbilical 1.5 cm horizontal scar.  Vertical midline incision.  Soft, non-tender;  no masses,  no organomegaly Extremities: extremities normal, atraumatic, no cyanosis or edema Skin: Skin color, texture, turgor normal. No rashes or lesions Lymph nodes: Cervical, supraclavicular, and axillary nodes normal. No abnormal inguinal nodes palpated Neurologic: Grossly normal   Pelvic: External genitalia:  no lesions              Urethra:  normal appearing urethra with no masses, tenderness or lesions              Bartholins and Skenes: normal                 Vagina: normal appearing vagina with normal color and  discharge, no lesions              Cervix: absent  Pap taken: no        Bimanual Exam:  Uterus:   absent                                      Adnexa:  Left sided firm mass.                                      Rectovaginal: Confirms.  Stool also noted in the rectal vault, separate from the intraperitoneal mass.                                      Anus:  normal sphincter tone, no lesions  Assessment  Pelvic mass Mixed incontinence.  On Detrol LA.     P:     Mammogram yearly. pap smear not needed. Refill of Detrol LA Return for pelvic ultrasound. ACOG handout on incontinence. Will discuss stress incontinence further at next visit. return annually or prn     An After Visit Summary was printed and given to the patient.

## 2012-12-16 NOTE — Telephone Encounter (Signed)
Patient called concerning her Dietrol LA . Pharm: said he was faxing over paper to be signed before he could fill it.

## 2012-12-17 NOTE — Telephone Encounter (Signed)
Spoke with pharmacist regarding Rx of Detrol LA. Pharmacist states patient insurance coverage will not cover brad name only .  Patient notified of this at her CB#. Patient states that her husband faxed over a letter yesterday to our office from last year that was used for insurance coverage to agree to brand name only .  This Clinical research associate was unaware of this due to no documentation in EPIC of this. Will try to find fax letter to see to this Rx.

## 2012-12-21 NOTE — Telephone Encounter (Signed)
Information that was faxed by patient husband was given to nurse Marchelle Folks and given to Dr. Edward Jolly for review.

## 2012-12-24 ENCOUNTER — Encounter: Payer: Self-pay | Admitting: Obstetrics and Gynecology

## 2012-12-31 ENCOUNTER — Telehealth: Payer: Self-pay | Admitting: Obstetrics and Gynecology

## 2012-12-31 NOTE — Telephone Encounter (Signed)
No answer on home #. LMTCB. Sent letter of medical necessity to patient for medication.

## 2013-01-06 ENCOUNTER — Ambulatory Visit (INDEPENDENT_AMBULATORY_CARE_PROVIDER_SITE_OTHER): Payer: 59 | Admitting: Obstetrics and Gynecology

## 2013-01-06 ENCOUNTER — Encounter: Payer: Self-pay | Admitting: Obstetrics and Gynecology

## 2013-01-06 ENCOUNTER — Ambulatory Visit (INDEPENDENT_AMBULATORY_CARE_PROVIDER_SITE_OTHER): Payer: 59

## 2013-01-06 VITALS — BP 104/64 | HR 76 | Ht 71.0 in | Wt 182.0 lb

## 2013-01-06 DIAGNOSIS — K5900 Constipation, unspecified: Secondary | ICD-10-CM

## 2013-01-06 DIAGNOSIS — N3946 Mixed incontinence: Secondary | ICD-10-CM

## 2013-01-06 DIAGNOSIS — R1900 Intra-abdominal and pelvic swelling, mass and lump, unspecified site: Secondary | ICD-10-CM

## 2013-01-06 NOTE — Patient Instructions (Addendum)
Please call if you decide to proceed with physical therapy or surgical care of stress incontinence.  We would need to do the urodynamic testing before surgery could be performed.

## 2013-01-06 NOTE — Progress Notes (Signed)
Subjective  Patient here for pelvic ultrasound to rule out pelvic mass and also for a discussion of urinary incontinence.  Husband is here for the discussion as well.  Status post TAH.  Patient with urinary leakage with forceful maneuvers.   Also with urgency and leakage. Wears a pad all the time. Taking Detrol LA for urgency and frequency.  It is working well for the patient.  Is running out.   Our office sent a letter of medical necessity for Detrol LA to the patient at her home. We tried to sent it my fax to the patient's insurance company, but the fax number did not work.  Significant constipation and diarrhea - alternating.   Strains excessively on the commode.  Chronic problem.  Had urodynamics with Dr. Logan Bores - almost ten years ago.    Objective  See ultrasound below  Normal ovaries.  No pelvic masses.     Assessment  Normal pelvic ultrasound.  No pelvic mass.  Mass palpable was bowel filled with stool. Mixed incontinence.  Plan  Patient will adopt a more regular plan for treating her constipation.   She is already using Miralax and doing proper dietary choices. Activity encouraged but difficult for the patient due to arthritic pain and back pain from spinal stenosis.  Continue Detrol. Comprehensive discussion regarding of stress incontinence with a midurethral sling.  We discussed risks, benefits, and alternatives.   Risks included but were not limited to bleeding, infection, damage to surrounding organs, cystotomy, increased urgency, worsening of incontinence, prolonged catheterization due to urinary retention, reoperation, mesh exposures and erosions.  I also discussed physical therapy.  Declines surgery at this time until she feels her bowel function is under better control.  Will need urodynamics if decides to pursue surgical treatment.

## 2013-03-17 ENCOUNTER — Other Ambulatory Visit: Payer: Self-pay

## 2013-03-30 ENCOUNTER — Telehealth: Payer: Self-pay | Admitting: *Deleted

## 2013-03-30 NOTE — Telephone Encounter (Signed)
Pt's husband received a letter Dr Jarold Motto is retiring. They wanted to know which doctor they will get and asked about Dr Rhea Belton. Dr Jarold Motto, OK for pt to transfer to Dr Lonna Cobb? Thanks

## 2013-03-31 NOTE — Telephone Encounter (Signed)
Dr Jarold Motto, pt had EGD in 2010, Barrett's was found and she should have have a 3 year recall per notes; she never received a notice. Pt reports she is having a little trouble swallowing. Should pt have an OV or have Direct EGD; normal COLON 10/18/2003 recall in for COLON. Please advise. Thanks.

## 2013-03-31 NOTE — Telephone Encounter (Signed)
Dr.Pyrtle is fine.Marland Kitchenibuprofen am here till March1,

## 2013-03-31 NOTE — Telephone Encounter (Signed)
lmtcb

## 2013-03-31 NOTE — Telephone Encounter (Signed)
Patient should remain with Dr. Patterson until he retires then I'm happy to accept 

## 2013-03-31 NOTE — Telephone Encounter (Signed)
Dr Rhea Belton, will you accept this pt? Wife is asking also.

## 2013-04-01 NOTE — Telephone Encounter (Signed)
We can schedule her endoscopy and colonoscopy now with me. Please do not offer my patients to other doctors until I retire

## 2013-04-01 NOTE — Telephone Encounter (Signed)
Scheduled pt for PV on 04/15/13 and her ECL on 04/18/13.

## 2013-04-15 ENCOUNTER — Ambulatory Visit (AMBULATORY_SURGERY_CENTER): Payer: Self-pay

## 2013-04-15 VITALS — Ht 71.5 in | Wt 185.0 lb

## 2013-04-15 DIAGNOSIS — Z1211 Encounter for screening for malignant neoplasm of colon: Secondary | ICD-10-CM

## 2013-04-15 MED ORDER — RANITIDINE HCL 300 MG PO CAPS: 300.0000 mg | ORAL_CAPSULE | Freq: Every evening | ORAL | Status: DC

## 2013-04-15 MED ORDER — MOVIPREP 100 G PO SOLR: 1.0000 | Freq: Once | ORAL | Status: DC

## 2013-04-15 MED ORDER — SUCRALFATE 1 GM/10ML PO SUSP: 1.0000 g | Freq: Four times a day (QID) | ORAL | Status: DC

## 2013-04-18 ENCOUNTER — Other Ambulatory Visit: Payer: Self-pay | Admitting: *Deleted

## 2013-04-18 ENCOUNTER — Ambulatory Visit (AMBULATORY_SURGERY_CENTER): Payer: 59 | Admitting: Gastroenterology

## 2013-04-18 ENCOUNTER — Encounter: Payer: Self-pay | Admitting: Gastroenterology

## 2013-04-18 VITALS — BP 113/76 | HR 68 | Temp 97.1°F | Resp 16 | Ht 71.5 in | Wt 185.0 lb

## 2013-04-18 DIAGNOSIS — K219 Gastro-esophageal reflux disease without esophagitis: Secondary | ICD-10-CM

## 2013-04-18 DIAGNOSIS — Z1211 Encounter for screening for malignant neoplasm of colon: Secondary | ICD-10-CM

## 2013-04-18 DIAGNOSIS — R1310 Dysphagia, unspecified: Secondary | ICD-10-CM

## 2013-04-18 DIAGNOSIS — D126 Benign neoplasm of colon, unspecified: Secondary | ICD-10-CM

## 2013-04-18 MED ORDER — LINACLOTIDE 290 MCG PO CAPS: 290.0000 ug | ORAL_CAPSULE | Freq: Every day | ORAL | Status: DC

## 2013-04-18 MED ORDER — SODIUM CHLORIDE 0.9 % IV SOLN: 500.0000 mL | INTRAVENOUS | Status: DC

## 2013-04-18 NOTE — Progress Notes (Addendum)
Called to room by CRNA p.maholtz and tech D Georgina Pillion. Informed that polyp not retrieved and md aware. ewm Called to room to assist during endoscopic procedure.  Patient ID and intended procedure confirmed with present staff. Received instructions for my participation in the procedure from the performing physician. ewm

## 2013-04-18 NOTE — Progress Notes (Signed)
Colon scope 01  Changed to 04.

## 2013-04-18 NOTE — Op Note (Signed)
[facility] [address] [place], [address]   COLONOSCOPY PROCEDURE REPORT  PATIENT: Angela, Ray  MR#: [id number] BIRTHDATE: [birth year] , 62  yrs. old GENDER: Female ENDOSCOPIST: Mardella Layman, MD, [facility] REFERRED BY: PROCEDURE DATE:  04/18/2013 PROCEDURE:   Colonoscopy with snare polypectomy First Screening Colonoscopy - Avg.  risk and is 50 yrs.  old or older - No.      History of Adenoma - Now for follow-up colonoscopy & has been > or = to 3 yrs.  [name]  Polyps Removed Today? Yes. ASA CLASS:   Class II INDICATIONS:average risk screening. MEDICATIONS: propofol (Diprivan) 400mg  IV  DESCRIPTION OF PROCEDURE:   After the risks benefits and alternatives of the procedure were thoroughly explained, informed consent was obtained.  A digital rectal exam revealed no abnormalities of the rectum.   The LB [id number]  endoscope was introduced through the anus and advanced to the cecum, which was identified by both the appendix and ileocecal valve. No adverse events experienced.   Limited by poor preparation.   Limited by a tortuous and redundant colon.   Limited by equipment malfunction. The quality of the prep was poor, using MoviPrep  The instrument was then slowly withdrawn as the colon was fully examined.      COLON FINDINGS: A polypoid shaped semi-pedunculated polyp measuring 1.5 cm in size was found in the descending colon.  A polypectomy was performed using snare cautery.  The resection was complete and the polyp tissue was not retrieved.   The colon was otherwise normal.  There was no diverticulosis, inflammation, polyps or cancers unless previously stated.  Retroflexed views revealed no abnormalities. The time to cecum=29 minutes 0 seconds.  Withdrawal time=8 minutes 03 seconds.  The scope was withdrawn and the procedure completed. COMPLICATIONS: There were no complications.  ENDOSCOPIC IMPRESSION: 1.   Semi-pedunculated polyp measuring  1.5 cm in size was found in the descending colon; polypectomy was performed using snare cautery  2.   The colon was otherwise normal ...very redundant,long, and tortuous colon...very hard exam !!!!! 3.   Poor prep  RECOMMENDATIONS: 1.  Repeat Colonoscopy in 1 year. 2.  Metamucil or benefiber 3.  Continue current medications   eSigned:  Mardella Layman, MD, [facility] 04/18/2013 11:34 AM   cc:

## 2013-04-18 NOTE — Op Note (Signed)
[facility] [address] [place], [address]   ENDOSCOPY PROCEDURE REPORT  PATIENT: Angela, Ray  MR#: [id number] BIRTHDATE: 11-10-1950 , 62  yrs. old GENDER: Female ENDOSCOPIST:David Hale Bogus, MD, [facility] REFERRED BY: PROCEDURE DATE:  04/18/2013 PROCEDURE:   EGD w/ biopsy for H.pylori and Maloney dilation of esophagus ASA CLASS:    Class II INDICATIONS: Dyspepsia and Dysphagia. MEDICATION: There was residual sedation effect present from prior procedure and Propofol (Diprivan) 130 mg IV TOPICAL ANESTHETIC:  DESCRIPTION OF PROCEDURE:   After the risks and benefits of the procedure were explained, informed consent was obtained.  The LB [id number]  endoscope was introduced through the mouth  and advanced to the second portion of the duodenum .  The instrument was slowly withdrawn as the mucosa was fully examined.      DUODENUM: The duodenal mucosa showed no abnormalities in the bulb and second portion of the duodenum.  STOMACH: There was mild antral gastropathy noted.  CLO Bx. done.....   ESOPHAGUS: There was LA Class B esophagitis noted.Dilated #82F Maloney dilator...no heme or pain....    Retroflexed views revealed a small hiatal hernia.    The scope was then withdrawn from the patient and the procedure completed.  COMPLICATIONS: There were no complications.   ENDOSCOPIC IMPRESSION: 1.   The duodenal mucosa showed no abnormalities in the bulb and second portion of the duodenum 2.   There was mild antral gastropathy noted [T2] ...r/o H.pylori 3.   There was LA Class B esophagitis noted...chronic GERD.Marland KitchenMarland Kitchen?? occult stricture dilated.  RECOMMENDATIONS: 1.  Await biopsy results 2.  Continue current medications 3.  Dilatations PRN 4.  Rx CLO if positive    _______________________________ eSigned:  Mardella Layman, MD, [facility] 04/18/2013 11:40 AM

## 2013-04-18 NOTE — Patient Instructions (Signed)
YOU HAD AN ENDOSCOPIC PROCEDURE TODAY AT THE [facility]: Refer to the procedure report that was given to you for any specific questions about what was found during the examination.  If the procedure report does not answer your questions, please call your gastroenterologist to clarify.  If you requested that your care partner not be given the details of your procedure findings, then the procedure report has been included in a sealed envelope for you to review at your convenience later.  YOU SHOULD EXPECT: Some feelings of bloating in the abdomen. Passage of more gas than usual.  Walking can help get rid of the air that was put into your GI tract during the procedure and reduce the bloating. If you had a lower endoscopy (such as a colonoscopy or flexible sigmoidoscopy) you may notice spotting of blood in your stool or on the toilet paper. If you underwent a bowel prep for your procedure, then you may not have a normal bowel movement for a few days.  DIET: Your first meal following the procedure should be a light meal and then it is ok to progress to your normal diet.  A half-sandwich or bowl of soup is an example of a good first meal.  Heavy or fried foods are harder to digest and may make you feel nauseous or bloated.  Likewise meals heavy in dairy and vegetables can cause extra gas to form and this can also increase the bloating.  Drink plenty of fluids but you should avoid alcoholic beverages for 24 hours.  ACTIVITY: Your care partner should take you home directly after the procedure.  You should plan to take it easy, moving slowly for the rest of the day.  You can resume normal activity the day after the procedure however you should NOT DRIVE or use heavy machinery for 24 hours (because of the sedation medicines used during the test).    SYMPTOMS TO REPORT IMMEDIATELY: A gastroenterologist can be reached at any hour.  During normal business hours, 8:30 AM to 5:00 PM Monday through Friday,  call [phone].  After hours and on weekends, please call the GI answering service at [phone] who will take a message and have the physician on call contact you.   Following lower endoscopy (colonoscopy or flexible sigmoidoscopy):  Excessive amounts of blood in the stool  Significant tenderness or worsening of abdominal pains  Swelling of the abdomen that is new, acute  Fever of 100F or higher  Following upper endoscopy (EGD)  Vomiting of blood or coffee ground material  New chest pain or pain under the shoulder blades  Painful or persistently difficult swallowing  New shortness of breath  Fever of 100F or higher  Black, tarry-looking stools  FOLLOW UP: If any biopsies were taken you will be contacted by phone or by letter within the next 1-3 weeks.  Call your gastroenterologist if you have not heard about the biopsies in 3 weeks.  Our staff will call the home number listed on your records the next business day following your procedure to check on you and address any questions or concerns that you may have at that time regarding the information given to you following your procedure. This is a courtesy call and so if there is no answer at the home number and we have not heard from you through the emergency physician on call, we will assume that you have returned to your regular daily activities without incident.  SIGNATURES/CONFIDENTIALITY: You and/or your care   partner have signed paperwork which will be entered into your electronic medical record.  These signatures attest to the fact that that the information above on your After Visit Summary has been reviewed and is understood.  Full responsibility of the confidentiality of this discharge information lies with you and/or your care-partner.   UPPER ENDOSCOPY- AWAIT BIOPSY RESULTS, FOLLOW DILATATION DIET TODAY,CONTINUE CURRENT MEDICATIONS  INFORMATION ON ESOPHAGITIS & REFLUX GIVEN TO YOU TODAY  COLONOSCOPY- POLYP REMOVED BUT  NOT RETRIEVED, REPEAT COLONOSCOPY IN ONE YEAR  LINZESS SAMPLES GIVEN TO YOU -TO TAKE ONE DAILY  METAMUCIL OR BENEFIBER (OVER THE COUNTER) AS DIRECTED WITH LIBERAL FLUID INTAKE  INFORMATION ON CONSTIPATION GIVEN TO YOU TODAY  APPOINTMENT MADE FOR YOU TO SEE DR Geanie Cooley IN OFFICE January 6TH,2015 @ 10:30 AM

## 2013-04-18 NOTE — Progress Notes (Signed)
Report to pacu rn, vss, bbs=clear 

## 2013-04-18 NOTE — Progress Notes (Signed)
Patient did not experience any of the following events: a burn prior to discharge; a fall within the facility; wrong site/side/patient/procedure/implant event; or a hospital transfer or hospital admission upon discharge from the facility. (G8907) Patient did not have preoperative order for IV antibiotic SSI prophylaxis. (G8918)  

## 2013-04-18 NOTE — Progress Notes (Signed)
Per Dr Jarold Motto this patient doesn't  not have barrett's esophagus as a diagnosis that is in her chart for today and he wants this removed. Diagnosis removed per physician request. ewm

## 2013-04-19 ENCOUNTER — Telehealth: Payer: Self-pay | Admitting: *Deleted

## 2013-04-19 ENCOUNTER — Encounter: Payer: Self-pay | Admitting: Gastroenterology

## 2013-04-19 NOTE — Telephone Encounter (Signed)
  Follow up Call-  Call back number 04/18/2013  Post procedure Call Back phone  # [phone]  Permission to leave phone message Yes     Patient questions:  Do you have a fever, pain , or abdominal swelling? no Pain Score  0 *  Have you tolerated food without any problems? yes  Have you been able to return to your normal activities? yes  Do you have any questions about your discharge instructions: Diet   no Medications  no Follow up visit  no  Do you have questions or concerns about your Care? no  Actions: * If pain score is 4 or above: No action needed, pain <4.   

## 2013-04-19 NOTE — Telephone Encounter (Signed)
  Follow up Call-  Call back number 04/18/2013  Post procedure Call Back phone  # [phone]  Permission to leave phone message Yes     Patient questions:  Do you have a fever, pain , or abdominal swelling? no Pain Score  0 *  Have you tolerated food without any problems? yes  Have you been able to return to your normal activities? yes  Do you have any questions about your discharge instructions: Diet   no Medications  no Follow up visit  no  Do you have questions or concerns about your Care? no  Actions: * If pain score is 4 or above: No action needed, pain <4.

## 2013-05-09 ENCOUNTER — Other Ambulatory Visit: Payer: Self-pay | Admitting: Dermatology

## 2013-05-17 ENCOUNTER — Encounter: Payer: Self-pay | Admitting: Gastroenterology

## 2013-05-17 ENCOUNTER — Ambulatory Visit (INDEPENDENT_AMBULATORY_CARE_PROVIDER_SITE_OTHER): Payer: 59 | Admitting: Gastroenterology

## 2013-05-17 VITALS — BP 106/68 | HR 77 | Ht 70.75 in | Wt 184.4 lb

## 2013-05-17 DIAGNOSIS — Z86010 Personal history of colonic polyps: Secondary | ICD-10-CM

## 2013-05-17 DIAGNOSIS — K5900 Constipation, unspecified: Secondary | ICD-10-CM

## 2013-05-17 DIAGNOSIS — K219 Gastro-esophageal reflux disease without esophagitis: Secondary | ICD-10-CM

## 2013-05-17 NOTE — Progress Notes (Signed)
History of Present Illness: This is a C.-year-old Caucasian female with chronic functional constipation.  She recently underwent colonoscopy with removal of a rather large what appeared to be villous adenoma of her colon, but the tissue was not retrieved.  She is scheduled for followup exam in one years time.  She takes fiber one cereal daily, 2 stool softeners a day, liberal by mouth fluids, and continues to have constipation.  Trial of Linzess 290 mcg a day resulted in immediate diarrhea.  If she takes no laxatives, she says she will not get to the bathroom for 5-6 days.  She denies upper GI complaints this time, and a recent endoscopy was negative.  She is on Zantac and Carafate for acid reflux.    Current Medications, Allergies, Past Medical History, Past Surgical History, Family History and Social History were reviewed in [employer] record.  ROS: All systems were reviewed and are negative unless otherwise stated in the HPI.   Vital signs are normal blood pressure 106/68, pulse 77 and regular and weight 184 with a BMI of 25.90.  Patient is nonsmoker       Assessment and plan: Chronic functional constipation with an associated polyp which was recently excised.  I've asked her to retry Linzess 145 mcg a day or every other day or every 3 days as tolerated while reducing her over-the-counter stool softeners.  She also uses when necessary MiraLax which needs to be adjusted accordingly.  She says that she's tried Benefiber in the past and had and up in her upper esophageal and stomach areas.  She may have an underlying GI motility disorder.  She continues to have difficulties I would suggest high-resolution esophageal manometry and perhaps Sitz marker studies for her constipation definition.  I would recommend followup colonoscopy in 1 year time since she had a large polyp without tissue recovery.

## 2013-05-17 NOTE — Patient Instructions (Signed)
We have given you samples of the following medication to take: Linzess 145 mcg, please take one capsule by mouth once daily   Please call in two weeks and ask for Dr. Buel Ream nurse Caren Griffins to give an update on how you are feeling.

## 2013-12-07 ENCOUNTER — Other Ambulatory Visit: Payer: Self-pay | Admitting: Dermatology

## 2013-12-19 ENCOUNTER — Encounter: Payer: Self-pay | Admitting: Obstetrics and Gynecology

## 2013-12-19 ENCOUNTER — Ambulatory Visit (INDEPENDENT_AMBULATORY_CARE_PROVIDER_SITE_OTHER): Payer: 59 | Admitting: Obstetrics and Gynecology

## 2013-12-19 ENCOUNTER — Telehealth: Payer: Self-pay | Admitting: Gastroenterology

## 2013-12-19 VITALS — BP 102/60 | HR 60 | Ht 71.0 in | Wt 186.0 lb

## 2013-12-19 DIAGNOSIS — Z0000 Encounter for general adult medical examination without abnormal findings: Secondary | ICD-10-CM

## 2013-12-19 DIAGNOSIS — Z01419 Encounter for gynecological examination (general) (routine) without abnormal findings: Secondary | ICD-10-CM

## 2013-12-19 LAB — POCT URINALYSIS DIPSTICK
Bilirubin, UA: NEGATIVE
Blood, UA: NEGATIVE
Glucose, UA: NEGATIVE
Ketones, UA: NEGATIVE
Leukocytes, UA: NEGATIVE
Nitrite, UA: NEGATIVE
PROTEIN UA: NEGATIVE
Urobilinogen, UA: NEGATIVE
pH, UA: 5

## 2013-12-19 LAB — CBC
HCT: 41.5 % (ref 36.0–46.0)
Hemoglobin: 14 g/dL (ref 12.0–15.0)
MCH: 31.1 pg (ref 26.0–34.0)
MCHC: 33.7 g/dL (ref 30.0–36.0)
MCV: 92.2 fL (ref 78.0–100.0)
PLATELETS: 217 10*3/uL (ref 150–400)
RBC: 4.5 MIL/uL (ref 3.87–5.11)
RDW: 13.5 % (ref 11.5–15.5)
WBC: 4.8 10*3/uL (ref 4.0–10.5)

## 2013-12-19 LAB — HEMOGLOBIN, FINGERSTICK: Hemoglobin, fingerstick: 14 g/dL (ref 12.0–16.0)

## 2013-12-19 MED ORDER — LINACLOTIDE 145 MCG PO CAPS: 145.0000 ug | ORAL_CAPSULE | Freq: Every day | ORAL | Status: DC

## 2013-12-19 NOTE — Telephone Encounter (Signed)
Dr. Ardis Hughs,   Patient has a recall colonoscopy scheduled with you for 04-2014. Patient was last seen by Dr. Sharlett Iles [phone]. Patient is requesting prescription for Linzess, patient is out of samples. Is it okay to send?

## 2013-12-19 NOTE — Telephone Encounter (Signed)
Yes, linzess 145, one pill once daily, disp 30 with 11 refills. Thanks

## 2013-12-19 NOTE — Progress Notes (Signed)
GYNECOLOGY VISIT  PCP: Dr. Darcus Austin  Referring provider:   HPI: 63 y.o.   Married  Caucasian  female   G0P0 with Patient's last menstrual period was 02/09/2002.   here for   Annual. Patient has had a hysterectomy.   Patient having neuropathy due to medication which treated rheumatoid arthritis.  Patient states this was a misdiagnosis. She has a little bit of RA but has osteoarthritis and spinal stenosis.  Feels badly that she has taking such strong medication for a long time.   Stopped Detrol and has found urinary control is better than it was in the past.   Still with constipation.  Having difficulty controlling gas.  Sees GI from [facility]. Received an Rx for Latisse.  Did not refill.  Wants to do blood work today.   Mother died this year.  Father is heartbroken.   Hgb:  14.0 Urine:  Negative, ph-5.0  GYNECOLOGIC HISTORY: Patient's last menstrual period was 02/09/2002. Sexually active:  yes Partner preference: female Contraception:  hysterectomy  Menopausal hormone therapy: no DES exposure:   no Blood transfusions:no    Sexually transmitted diseases:  no  GYN procedures and prior surgeries:  hysterectomy Last mammogram:    11/29/13 BI-RADS Negative             Last pap and high risk HPV testing:   11/2010 WNL History of abnormal pap smear:  No   OB History   Grav Para Term Preterm Abortions TAB SAB Ect Mult Living   0                LIFESTYLE: Exercise:  No             Tobacco: No Alcohol:No Drug use: No   OTHER HEALTH MAINTENANCE: Tetanus/TDap:unsure, has had one in the last 10 years.  Gardisil:no Influenza:  03/2013 Zostavax: No  Bone density: 11/2011 Left hip and Spine T scores each -0.5 Colonoscopy:2/15 - polyps, normal  Cholesterol check: [name]  Family History  Problem Relation Age of Onset  . Coronary artery disease Father     CABG 2002, followd by Dr, Ron Parker  . Prostate cancer Father   . Bladder Cancer Father   . Skin cancer Father    . Diabetes Father 76    DM type 2  . Atrial fibrillation Mother   . Multiple myeloma Mother   . Skin cancer Mother   . Coronary artery disease Mother   . Diabetes Maternal Grandmother   . Hypertension Maternal Grandmother   . Diabetes Maternal Grandfather   . Hypertension Maternal Grandfather   . Osteoarthritis Paternal Grandmother   . Colon cancer Neg Hx     Patient Active Problem List   Diagnosis Date Noted  . Mixed incontinence 12/16/2012  . Chest pain 06/24/2011  . ANXIETY 07/11/2008  . GERD 07/05/2008  . GASTRITIS 07/05/2008  . HIATAL HERNIA 07/05/2008  . CONSTIPATION 07/05/2008  . GALLSTONES 07/05/2008   Past Medical History  Diagnosis Date  . GERD (gastroesophageal reflux disease)   . Vertigo   . Anxiety   . Hiatal hernia   . Depression   . Skin cancer   . Fibroid   . Osteoarthritis   . Fibromyalgia   . Spinal stenosis     lumbar    Past Surgical History  Procedure Laterality Date  . Rotator cuff repair Right 2011  . Cervical laminectomy  2002    C5/C6  . Cholecystectomy  2008  . Abdominal hysterectomy  2003  TAH still has ovaries--[name]    ALLERGIES: Aciphex; Aspirin; Bupropion hcl; Cephalexin; Codeine; Codeine phosphate; Doxycycline hyclate; Esomeprazole magnesium; Hydrocodone-acetaminophen; Oxycodone-acetaminophen; and Sertraline hcl  Current Outpatient Prescriptions  Medication Sig Dispense Refill  . busPIRone (BUSPAR) 15 MG tablet 1 tab two times a day      . cetirizine (ZYRTEC) 10 MG tablet Take 10 mg by mouth. As needed      . clonazePAM (KLONOPIN) 0.5 MG tablet 1 tab three times a day      . cyclobenzaprine (FLEXERIL) 10 MG tablet Take 5 mg by mouth at bedtime.      . folic acid (FOLVITE) 1 MG tablet Take 1 mg by mouth daily.      Marland Kitchen ibuprofen (ADVIL,MOTRIN) 800 MG tablet Take 800 mg by mouth every 8 (eight) hours as needed.      . NON FORMULARY hydroEye 3 times daily for dry eyes      . PARoxetine (PAXIL) 40 MG tablet 1 tab daily       . ranitidine (ZANTAC) 300 MG capsule Take 1 capsule (300 mg total) by mouth every evening.  31 capsule  3  . [DISCONTINUED] tolterodine (DETROL LA) 4 MG 24 hr capsule Take 4 mg by mouth daily. Needs name brand only--generic does not work for patient.       No current facility-administered medications for this visit.     ROS:  Pertinent items are noted in HPI.  SOCIAL HISTORY:  Married.  Retired.   PHYSICAL EXAMINATION:    BP 102/60  Pulse 60  Ht 5' 11"  (1.803 m)  Wt 186 lb (84.369 kg)  BMI 25.95 kg/m2  LMP 02/09/2002   Wt Readings from Last 3 Encounters:  12/19/13 186 lb (84.369 kg)  05/17/13 184 lb 6 oz (83.632 kg)  04/18/13 185 lb (83.915 kg)     Ht Readings from Last 3 Encounters:  12/19/13 5' 11"  (1.803 m)  05/17/13 5' 10.75" (1.797 m)  04/18/13 5' 11.5" (1.816 m)    General appearance: alert, cooperative and appears stated age Head: Normocephalic, without obvious abnormality, atraumatic Neck: no adenopathy, supple, symmetrical, trachea midline and thyroid not enlarged, symmetric, no tenderness/mass/nodules Lungs: clear to auscultation bilaterally Breasts: Inspection negative, No nipple retraction or dimpling, No nipple discharge or bleeding, No axillary or supraclavicular adenopathy, Normal to palpation without dominant masses Heart: regular rate and rhythm Abdomen: soft, non-tender; no masses,  no organomegaly Extremities: extremities normal, atraumatic, no cyanosis or edema Skin: Skin color, texture, turgor normal. No rashes or lesions Lymph nodes: Cervical, supraclavicular, and axillary nodes normal. No abnormal inguinal nodes palpated Neurologic: Grossly normal  Pelvic: External genitalia:  no lesions              Urethra:  normal appearing urethra with no masses, tenderness or lesions              Bartholins and Skenes: normal                 Vagina: normal appearing vagina with normal color and discharge, no lesions              Cervix:  absent               Pap and high risk HPV testing done: No..            Bimanual Exam:  Uterus:   absent  Adnexa: normal adnexa in size, nontender and no masses                                      Rectovaginal: Confirms                                      Anus:  normal sphincter tone, no lesions  ASSESSMENT  Normal gynecologic exam. Status post TAH. Ovaries remain.  Constipation issues.  Overactive bladder.  Doing well off Rx.  PLAN  Mammogram recommended yearly.  Pap smear and high risk HPV testing not indicated.  Counseled on self breast exam, Calcium and vitamin D intake, exercise. Bone density next year.  Return annually or prn   An After Visit Summary was printed and given to the patient.

## 2013-12-19 NOTE — Patient Instructions (Signed)

## 2013-12-20 LAB — COMPREHENSIVE METABOLIC PANEL
ALBUMIN: 4.5 g/dL (ref 3.5–5.2)
ALT: 32 U/L (ref 0–35)
AST: 31 U/L (ref 0–37)
Alkaline Phosphatase: 36 U/L — ABNORMAL LOW (ref 39–117)
BUN: 11 mg/dL (ref 6–23)
CO2: 31 meq/L (ref 19–32)
CREATININE: 0.64 mg/dL (ref 0.50–1.10)
Calcium: 9 mg/dL (ref 8.4–10.5)
Chloride: 100 mEq/L (ref 96–112)
Glucose, Bld: 90 mg/dL (ref 70–99)
POTASSIUM: 4.5 meq/L (ref 3.5–5.3)
SODIUM: 139 meq/L (ref 135–145)
TOTAL PROTEIN: 6.8 g/dL (ref 6.0–8.3)
Total Bilirubin: 0.7 mg/dL (ref 0.2–1.2)

## 2013-12-20 LAB — LIPID PANEL
CHOL/HDL RATIO: 4.3 ratio
Cholesterol: 220 mg/dL — ABNORMAL HIGH (ref 0–200)
HDL: 51 mg/dL (ref >39)
LDL CALC: 142 mg/dL — AB (ref 0–99)
Triglycerides: 135 mg/dL (ref <150)
VLDL: 27 mg/dL (ref 0–40)

## 2013-12-21 DIAGNOSIS — K5902 Outlet dysfunction constipation: Secondary | ICD-10-CM | POA: Insufficient documentation

## 2013-12-21 DIAGNOSIS — K5904 Chronic idiopathic constipation: Secondary | ICD-10-CM | POA: Insufficient documentation

## 2014-01-13 ENCOUNTER — Telehealth: Payer: Self-pay

## 2014-01-13 NOTE — Telephone Encounter (Signed)
Spoke to [place] with [employer] [phone] to start a prior authorization for linzess 145 mcg, dx: 564.00, constipation.  Angela Ray is having to forward the information to his supervisor because they don't have a form for the [place]   He will call us back with a decision.  He will ask for Christian Mate, CMA since it is a Dr. Ardis Hughs patient and I will be out.

## 2014-02-17 ENCOUNTER — Other Ambulatory Visit: Payer: Self-pay | Admitting: Dermatology

## 2014-02-17 ENCOUNTER — Encounter: Payer: Self-pay | Admitting: Gastroenterology

## 2014-04-20 ENCOUNTER — Other Ambulatory Visit: Payer: Self-pay

## 2014-04-20 MED ORDER — LINACLOTIDE 145 MCG PO CAPS: 145.0000 ug | ORAL_CAPSULE | Freq: Every day | ORAL | Status: DC

## 2014-05-12 DIAGNOSIS — G250 Essential tremor: Secondary | ICD-10-CM

## 2014-05-12 DIAGNOSIS — M069 Rheumatoid arthritis, unspecified: Secondary | ICD-10-CM

## 2014-05-12 HISTORY — DX: Essential tremor: G25.0

## 2014-05-12 HISTORY — DX: Rheumatoid arthritis, unspecified: M06.9

## 2014-05-18 ENCOUNTER — Encounter: Payer: Self-pay | Admitting: Gastroenterology

## 2014-09-15 ENCOUNTER — Encounter: Payer: Self-pay | Admitting: Gastroenterology

## 2014-10-05 ENCOUNTER — Other Ambulatory Visit: Payer: Self-pay | Admitting: Family Medicine

## 2014-10-05 DIAGNOSIS — M5416 Radiculopathy, lumbar region: Secondary | ICD-10-CM

## 2014-10-11 HISTORY — PX: OTHER SURGICAL HISTORY: SHX169

## 2014-10-14 ENCOUNTER — Encounter (HOSPITAL_COMMUNITY): Payer: Self-pay

## 2014-10-14 ENCOUNTER — Emergency Department (HOSPITAL_COMMUNITY): Payer: 59

## 2014-10-14 ENCOUNTER — Emergency Department (HOSPITAL_COMMUNITY)
Admission: EM | Admit: 2014-10-14 | Discharge: 2014-10-14 | Disposition: A | Discharge status: Home / Self Care | Payer: 59 | Attending: Emergency Medicine | Admitting: Emergency Medicine

## 2014-10-14 DIAGNOSIS — K219 Gastro-esophageal reflux disease without esophagitis: Secondary | ICD-10-CM | POA: Insufficient documentation

## 2014-10-14 DIAGNOSIS — M797 Fibromyalgia: Secondary | ICD-10-CM | POA: Diagnosis not present

## 2014-10-14 DIAGNOSIS — M1990 Unspecified osteoarthritis, unspecified site: Secondary | ICD-10-CM | POA: Insufficient documentation

## 2014-10-14 DIAGNOSIS — R0789 Other chest pain: Secondary | ICD-10-CM | POA: Diagnosis not present

## 2014-10-14 DIAGNOSIS — Z86018 Personal history of other benign neoplasm: Secondary | ICD-10-CM | POA: Diagnosis not present

## 2014-10-14 DIAGNOSIS — F329 Major depressive disorder, single episode, unspecified: Secondary | ICD-10-CM | POA: Insufficient documentation

## 2014-10-14 DIAGNOSIS — Z79899 Other long term (current) drug therapy: Secondary | ICD-10-CM | POA: Diagnosis not present

## 2014-10-14 DIAGNOSIS — F419 Anxiety disorder, unspecified: Secondary | ICD-10-CM | POA: Insufficient documentation

## 2014-10-14 DIAGNOSIS — R079 Chest pain, unspecified: Secondary | ICD-10-CM | POA: Diagnosis present

## 2014-10-14 DIAGNOSIS — Z85828 Personal history of other malignant neoplasm of skin: Secondary | ICD-10-CM | POA: Diagnosis not present

## 2014-10-14 LAB — I-STAT TROPONIN, ED
TROPONIN I, POC: 0 ng/mL (ref 0.00–0.08)
Troponin i, poc: 0 ng/mL (ref 0.00–0.08)

## 2014-10-14 LAB — BASIC METABOLIC PANEL
ANION GAP: 8 (ref 5–15)
BUN: 8 mg/dL (ref 6–20)
CO2: 31 mmol/L (ref 22–32)
CREATININE: 0.7 mg/dL (ref 0.44–1.00)
Calcium: 9 mg/dL (ref 8.9–10.3)
Chloride: 104 mmol/L (ref 101–111)
GFR calc non Af Amer: >60 mL/min (ref >60)
GLUCOSE: 115 mg/dL — AB (ref 65–99)
Potassium: 4.1 mmol/L (ref 3.5–5.1)
Sodium: 143 mmol/L (ref 135–145)

## 2014-10-14 LAB — CBC
HCT: 37.6 % (ref 36.0–46.0)
HEMOGLOBIN: 12.8 g/dL (ref 12.0–15.0)
MCH: 32.7 pg (ref 26.0–34.0)
MCHC: 34 g/dL (ref 30.0–36.0)
MCV: 95.9 fL (ref 78.0–100.0)
Platelets: 182 10*3/uL (ref 150–400)
RBC: 3.92 MIL/uL (ref 3.87–5.11)
RDW: 13.2 % (ref 11.5–15.5)
WBC: 4.1 10*3/uL (ref 4.0–10.5)

## 2014-10-14 NOTE — Discharge Instructions (Signed)
Your evaluation here is normal including heart enzymes checked at 2 different points in time. However, if you are worsening with return of chest pain, shortness of breath, or other new symptoms, you should be reevaluated. Otherwise, you can follow-up with your heart doctor early next week.  Chest Pain (Nonspecific) It is often hard to give a diagnosis for the cause of chest pain. There is always a chance that your pain could be related to something serious, such as a heart attack or a blood clot in the lungs. You need to follow up with your doctor. HOME CARE  If antibiotic medicine was given, take it as directed by your doctor. Finish the medicine even if you start to feel better.  For the next few days, avoid activities that bring on chest pain. Continue physical activities as told by your doctor.  Do not use any tobacco products. This includes cigarettes, chewing tobacco, and e-cigarettes.  Avoid drinking alcohol.  Only take medicine as told by your doctor.  Follow your doctor's suggestions for more testing if your chest pain does not go away.  Keep all doctor visits you made. GET HELP IF:  Your chest pain does not go away, even after treatment.  You have a rash with blisters on your chest.  You have a fever. GET HELP RIGHT AWAY IF:   You have more pain or pain that spreads to your arm, neck, jaw, back, or belly (abdomen).  You have shortness of breath.  You cough more than usual or cough up blood.  You have very bad back or belly pain.  You feel sick to your stomach (nauseous) or throw up (vomit).  You have very bad weakness.  You pass out (faint).  You have chills. This is an emergency. Do not wait to see if the problems will go away. Call your local emergency services (911 in [place] Do not drive yourself to the hospital. MAKE SURE YOU:   Understand these instructions.  Will watch your condition.  Will get help right away if you are not doing well or get  worse. Document Released: 10/15/2007 Document Revised: 05/03/2013 Document Reviewed: 10/15/2007 [facility] Patient Information 2015 [place], [place]. This information is not intended to replace advice given to you by your health care provider. Make sure you discuss any questions you have with your health care provider.

## 2014-10-14 NOTE — ED Notes (Signed)
Pt requesting something to drink.  RN made aware and advised she could have something.  Pt given cup, ice and ginger ale.

## 2014-10-14 NOTE — ED Provider Notes (Signed)
CSN: [id number]     Arrival date & time 10/14/14  1247 History   First MD Initiated Contact with Patient 10/14/14 1447     Chief Complaint  Patient presents with  . Chest Pain     (Consider location/radiation/quality/duration/timing/severity/associated sxs/prior Treatment) HPI 64 year old female with a history of GERD and hiatal hernia who presents today after she has epigastric pain that radiated up to her neck while she was at a car show. She was outside and it is extremely hot. She felt that she was becoming overheated. She had some sharp pain that ran from her epigastrium after neck and lasted several minutes. She got in size out of heat and the pain resolved without any intervention. She then went to the fire department and had aspirin. No similar pain episodes in the past. She has been followed by cardiology to have some baseline studies done. She reports she has had family history of coronary artery disease. Eyes any fever, cough, dyspnea, nausea, or vomiting. She has not had similar pain in the past. She denies any lateralized swelling, Past Medical History  Diagnosis Date  . GERD (gastroesophageal reflux disease)   . Vertigo   . Anxiety   . Hiatal hernia   . Depression   . Skin cancer   . Fibroid   . Osteoarthritis   . Fibromyalgia   . Spinal stenosis     lumbar   Past Surgical History  Procedure Laterality Date  . Rotator cuff repair Right 2011  . Cervical laminectomy  2002    C5/C6  . Cholecystectomy  2008  . Abdominal hysterectomy  2003    TAH still has ovaries--[name]   Family History  Problem Relation Age of Onset  . Coronary artery disease Father     CABG 2002, followd by Dr, Ron Parker  . Prostate cancer Father   . Bladder Cancer Father   . Skin cancer Father   . Diabetes Father 64    DM type 2  . Atrial fibrillation Mother   . Multiple myeloma Mother   . Skin cancer Mother   . Coronary artery disease Mother   . Diabetes Maternal Grandmother   .  Hypertension Maternal Grandmother   . Diabetes Maternal Grandfather   . Hypertension Maternal Grandfather   . Osteoarthritis Paternal Grandmother   . Colon cancer Neg Hx    History  Substance Use Topics  . Smoking status: Never Smoker   . Smokeless tobacco: Never Used  . Alcohol Use: No   OB History    Gravida Para Term Preterm AB TAB SAB Ectopic Multiple Living   0              Review of Systems  All other systems reviewed and are negative.     Allergies  Aciphex; Aspirin; Bupropion hcl; Cephalexin; Codeine; Codeine phosphate; Doxycycline hyclate; Esomeprazole magnesium; Hydrocodone-acetaminophen; Oxycodone-acetaminophen; and Sertraline hcl  Home Medications   Prior to Admission medications   Medication Sig Start Date End Date Taking? Authorizing Provider  busPIRone (BUSPAR) 15 MG tablet Take 15 mg by mouth 2 (two) times daily.  05/16/11  Yes Historical Provider, MD  cetirizine (ZYRTEC) 10 MG tablet Take 10 mg by mouth daily as needed for allergies.    Yes Historical Provider, MD  clonazePAM (KLONOPIN) 0.5 MG tablet Take 0.5 mg by mouth 3 (three) times daily. 1 tab three times a day 05/25/11  Yes Historical Provider, MD  cyclobenzaprine (FLEXERIL) 10 MG tablet Take 5 mg by mouth at  bedtime as needed for muscle spasms.    Yes Historical Provider, MD  diclofenac sodium (VOLTAREN) 1 % GEL Apply 2 g topically as needed (artritisis).   Yes Historical Provider, MD  fluticasone (FLONASE) 50 MCG/ACT nasal spray Place 2 sprays into both nostrils at bedtime as needed for allergies.  08/29/14  Yes Historical Provider, MD  ibuprofen (ADVIL,MOTRIN) 600 MG tablet Take 600 mg by mouth every 6 (six) hours as needed (pain).   Yes Historical Provider, MD  Linaclotide Rolan Lipa) 145 MCG CAPS capsule Take 1 capsule (145 mcg total) by mouth daily. 04/20/14  Yes Milus Banister, MD  omeprazole (PRILOSEC) 20 MG capsule Take 20 mg by mouth every evening.  08/24/14  Yes Historical Provider, MD  OVER THE  COUNTER MEDICATION Take 1 capsule by mouth 3 (three) times daily. Hydro eye   Yes Historical Provider, MD  PARoxetine (PAXIL) 40 MG tablet Take 40 mg by mouth daily.  05/05/11  Yes Historical Provider, MD  vitamin B-12 (CYANOCOBALAMIN) 250 MCG tablet Take 250 mcg by mouth daily.   Yes Historical Provider, MD  ranitidine (ZANTAC) 300 MG capsule Take 1 capsule (300 mg total) by mouth every evening. Patient not taking: Reported on 10/14/2014 04/15/13   Sable Feil, MD   BP 109/65 mmHg  Pulse 67  Temp(Src) 98.7 F (37.1 C) (Oral)  Resp 18  Ht 6' (1.829 m)  Wt 180 lb 3.2 oz (81.738 kg)  BMI 24.43 kg/m2  SpO2 100%  LMP 02/09/2002 Physical Exam  Constitutional: She is oriented to person, place, and time. She appears well-developed and well-nourished.  HENT:  Head: Normocephalic and atraumatic.  Right Ear: External ear normal.  Left Ear: External ear normal.  Nose: Nose normal.  Mouth/Throat: Oropharynx is clear and moist.  Eyes: Conjunctivae and EOM are normal. Pupils are equal, round, and reactive to light.  Neck: Normal range of motion. Neck supple.  Cardiovascular: Normal rate, regular rhythm, normal heart sounds and intact distal pulses.   Pulmonary/Chest: Effort normal and breath sounds normal.  Abdominal: Soft. Bowel sounds are normal.  Musculoskeletal: Normal range of motion.  Neurological: She is alert and oriented to person, place, and time. She has normal reflexes.  Skin: Skin is warm and dry.  Psychiatric: She has a normal mood and affect. Her behavior is normal. Judgment and thought content normal.  Nursing note and vitals reviewed.   ED Course  Procedures (including critical care time) Labs Review Labs Reviewed  BASIC METABOLIC PANEL - Abnormal; Notable for the following:    Glucose, Bld 115 (*)    All other components within normal limits  CBC  I-STAT TROPOININ, ED  I-STAT TROPOININ, ED    Imaging Review Dg Chest Port 1 View  10/14/2014   CLINICAL DATA:   Central chest pain moving upward, dull achy RIGHT side pain, history GERD, hiatal hernia  EXAM: PORTABLE CHEST - 1 VIEW  COMPARISON:  Portable exam 1336 hr compared to 04/22/2010  FINDINGS: Normal heart size, mediastinal contours, and pulmonary vascularity.  Lungs clear.  No pleural effusion or pneumothorax.  Prior cervical spine fusion.  Minimal endplate spur formation thoracic spine.  IMPRESSION: No acute abnormalities.   Electronically Signed   By: Lavonia Dana M.D.   On: 10/14/2014 14:22     EKG Interpretation   Date/Time:  Saturday October 14 2014 12:55:47 EDT Ventricular Rate:  80 PR Interval:  150 QRS Duration: 88 QT Interval:  400 QTC Calculation: 461 R Axis:   81 Text  Interpretation:  Normal sinus rhythm No acute changes Confirmed by  Teghan Philbin MD, Andee Poles [phone]) on 10/14/2014 2:37:32 PM      MDM   Final diagnoses:  Atypical chest pain    64 year old female who presents today with atypical chest pain. She had point of care markers done twice here that are normal. I have discussed return precautions and need for close follow-up with the patient and her family member. Voice understanding of plan and needed follow-up.    Pattricia Boss, MD 10/14/14 [phone]

## 2014-10-14 NOTE — ED Notes (Signed)
Pt waiting for discharge.  Getting dressed and family at bedside.

## 2014-10-14 NOTE — ED Notes (Signed)
Pt was at a car show when she had a sudden onset of epigastric pain that radiated up the midsternal chest and into the left shoulder.  Pt reports the pain was sharp in nature and "took my breath away".  Pt reports she became shaky but denies any other symptoms.  Pt was given 324mg  ASA PTA.  Pt denies any pain at this time.

## 2014-10-16 ENCOUNTER — Ambulatory Visit
Admission: RE | Admit: 2014-10-16 | Discharge: 2014-10-16 | Disposition: A | Discharge status: Home / Self Care | Payer: 59 | Source: Ambulatory Visit | Attending: Family Medicine | Admitting: Family Medicine

## 2014-10-16 DIAGNOSIS — M5416 Radiculopathy, lumbar region: Secondary | ICD-10-CM

## 2014-10-24 ENCOUNTER — Ambulatory Visit (INDEPENDENT_AMBULATORY_CARE_PROVIDER_SITE_OTHER): Payer: 59 | Admitting: Neurology

## 2014-10-24 ENCOUNTER — Encounter: Payer: Self-pay | Admitting: Neurology

## 2014-10-24 VITALS — BP 106/75 | HR 75 | Ht 72.0 in | Wt 178.0 lb

## 2014-10-24 DIAGNOSIS — M5440 Lumbago with sciatica, unspecified side: Secondary | ICD-10-CM | POA: Diagnosis not present

## 2014-10-24 DIAGNOSIS — G959 Disease of spinal cord, unspecified: Secondary | ICD-10-CM

## 2014-10-24 DIAGNOSIS — G250 Essential tremor: Secondary | ICD-10-CM | POA: Diagnosis not present

## 2014-10-24 MED ORDER — PROPRANOLOL HCL 20 MG PO TABS: 20.0000 mg | ORAL_TABLET | Freq: Two times a day (BID) | ORAL | Status: DC

## 2014-10-24 MED ORDER — GABAPENTIN 300 MG PO CAPS: ORAL_CAPSULE | ORAL | Status: DC

## 2014-10-24 NOTE — Progress Notes (Signed)
PATIENT: Angela Ray DOB: 1950/10/26  Chief Complaint  Patient presents with  . Back Pain    Reports burning,pain across low back that radiates into right hip and down to her foot.  . Tremors    Say tremors are present in bilateral hands, bilateral legs and jaw.  . Leg Cramps    Present in both legs from groin to feet that prevents her from sleeping well at night.  . Previous therapies    She has tried Prmidone (stopped due to side effects), Lorzone (not helpful), tizanidine (not helpful), and phenytoin (not helpful).  She has taken valacyclovir because her pain was thought to be Shingles. She has also tried epidural steroid injections.     HISTORICAL  Angela Ray is a 64 years old right-handed female, accompanied by her husband, seen in refer by her primary care physician Dr. Darcus Austin for evaluation of low back pain, radiating down to her legs, bilateral lower extremity cramping, bilateral hands shaking  I have reviewed referring note from Dr. Darcus Austin in Oct 04 2014,  Laboratory evaluation in Oct 04 2014, normal CBC, CMP with exception of mild elevated glucose 102, B12 1196, normal TSH 1.9  She has history of cervical decompression in May 2002, presented with right cervical radiculopathy, right-sided neck pain, radiating pain to right arm, right hand cramping, cervical decompression surgery has really helped her symptoms, she did not have gait difficulty, leg symptoms at that time  She has been very active all her life, including heavy lifting, had mild chronic low back pain, her symptoms has much worsened since fall of 2015, she noticed worsening low back pain, radiating pain to bilateral leg, mild unsteady gait, actually tripped and fell few times, in addition, she also complains of gradual worsening bilateral foot, leg muscle cramping, sometimes difficulty falling to sleep because painful muscle cramps, spasm, lasting for hours. She also feels bilateral lower  extremity weakness especially in early morning time, she has urinary urgency. Occasionally bilateral hands muscle cramping, paresthesia  Her mother has bilateral hands tremor in her seventies, Angela Ray began to experience worsening hand tremor since 2015, difficulty holding a pen, using utensils, mild jaw tremor  We have personally reviewed MRI lumbar in October 16 2014: Multilevel mild generative disc disease, most severe at L5-S1, L4-5, L3 and 4, there was no significant foraminal or canal stenosis.   Over the past few months, she has tried Dilantin  Dilantin 100 mg 3 times a day in March 2016, tizanidine 4 mg 1 tablet at bedtime in August 23 2014,   For tremor, she was given primidone  since March 2016, titrating dose up to half tablets in the morning, one tablets every night, but could not tolerate the medications, "funny in head" wean herself off it  since Sep 15 2014, She was also given Lozone  770m tid, valacyclovir for possible shingles.  REVIEW OF SYSTEMS: Full 14 system review of systems performed and notable only for feeling hot, cold, joint pain, cramps, achy muscles, running nose, skin sensitivity, constipation, urinary urgency, spinning sensation, weakness, dizziness, insomnia, depression anxiety decreased energy  ALLERGIES: Allergies  Allergen Reactions  . Aciphex [Rabeprazole Sodium]   . Aspirin     REACTION: unspecified  . Bupropion Hcl     REACTION: unspecified  . Cephalexin     REACTION: unspecified  . Codeine   . Codeine Phosphate     REACTION: unspecified  . Doxycycline Hyclate     REACTION: unspecified  .  Esomeprazole Magnesium   . Hydrocodone-Acetaminophen     REACTION: unspecified  . Oxycodone-Acetaminophen     REACTION: unspecified  . Sertraline Hcl     REACTION: unspecified    HOME MEDICATIONS: Current Outpatient Prescriptions  Medication Sig Dispense Refill  . busPIRone (BUSPAR) 15 MG tablet Take 15 mg by mouth 2 (two) times daily.     . cetirizine  (ZYRTEC) 10 MG tablet Take 10 mg by mouth daily as needed for allergies.     . Cholecalciferol (VITAMIN D3) 2000 UNITS TABS Take by mouth daily.    . clonazePAM (KLONOPIN) 0.5 MG tablet Take 0.5 mg by mouth 3 (three) times daily. 1 tab three times a day    . cyclobenzaprine (FLEXERIL) 10 MG tablet Take 5 mg by mouth at bedtime as needed for muscle spasms.     . diclofenac sodium (VOLTAREN) 1 % GEL Apply 2 g topically as needed (artritisis).    . fluticasone (FLONASE) 50 MCG/ACT nasal spray Place 2 sprays into both nostrils at bedtime as needed for allergies.   4  . ibuprofen (ADVIL,MOTRIN) 600 MG tablet Take 600 mg by mouth every 6 (six) hours as needed (pain).    . Linaclotide (LINZESS) 145 MCG CAPS capsule Take 1 capsule (145 mcg total) by mouth daily. 90 capsule 3  . Magnesium 400 MG TABS Take by mouth 2 (two) times daily.    Marland Kitchen omeprazole (PRILOSEC) 20 MG capsule Take 20 mg by mouth every evening.   6  . OVER THE COUNTER MEDICATION Take 1 capsule by mouth 3 (three) times daily. Hydro eye    . PARoxetine (PAXIL) 40 MG tablet Take 40 mg by mouth daily.     . vitamin B-12 (CYANOCOBALAMIN) 250 MCG tablet Take 250 mcg by mouth daily.    . [DISCONTINUED] tolterodine (DETROL LA) 4 MG 24 hr capsule Take 4 mg by mouth daily. Needs name brand only--generic does not work for patient.     No current facility-administered medications for this visit.    PAST MEDICAL HISTORY: Past Medical History  Diagnosis Date  . GERD (gastroesophageal reflux disease)   . Vertigo   . Anxiety   . Hiatal hernia   . Depression   . Skin cancer   . Fibroid   . Osteoarthritis   . Fibromyalgia   . Spinal stenosis     lumbar  . Low back pain   . Tremors of nervous system   . Leg cramps   . Heat exhaustion     PAST SURGICAL HISTORY: Past Surgical History  Procedure Laterality Date  . Rotator cuff repair Right 2011  . Cervical laminectomy  2002    C5/C6  . Cholecystectomy  2008  . Abdominal hysterectomy   2003    TAH still has ovaries--[name]    FAMILY HISTORY: Family History  Problem Relation Age of Onset  . Coronary artery disease Father     CABG 2002, followd by Dr, Ron Parker  . Prostate cancer Father   . Bladder Cancer Father   . Skin cancer Father   . Diabetes Father 27    DM type 2  . Atrial fibrillation Mother   . Multiple myeloma Mother   . Skin cancer Mother   . Coronary artery disease Mother   . Diabetes Maternal Grandmother   . Hypertension Maternal Grandmother   . Diabetes Maternal Grandfather   . Hypertension Maternal Grandfather   . Osteoarthritis Paternal Grandmother   . Colon cancer Neg Hx  SOCIAL HISTORY:  History   Social History  . Marital Status: Married    Spouse Name: [name]  . Number of Children: 0  . Years of Education: 12   Occupational History  . Retired    Social History Main Topics  . Smoking status: Never Smoker   . Smokeless tobacco: Never Used  . Alcohol Use: No  . Drug Use: No  . Sexual Activity:    Partners: Male    Birth Control/ Protection: Surgical     Comment: TAH still has ovaries   Other Topics Concern  . Not on file   Social History Narrative   Lives at home with her husband.   Right-handed.   Rarely uses caffeine.     PHYSICAL EXAM   Filed Vitals:   10/24/14 1007  BP: 106/75  Pulse: 75  Height: 6' (1.829 m)  Weight: 178 lb (80.74 kg)    Not recorded      Body mass index is 24.14 kg/(m^2).  PHYSICAL EXAMNIATION:  Gen: NAD, conversant, well nourised, obese, well groomed                     Cardiovascular: Regular rate rhythm, no peripheral edema, warm, nontender. Eyes: Conjunctivae clear without exudates or hemorrhage Neck: Supple, no carotid bruise. Pulmonary: Clear to auscultation bilaterally   NEUROLOGICAL EXAM:  MENTAL STATUS: Speech:    Speech is normal; fluent and spontaneous with normal comprehension.  Cognition:    The patient is oriented to person, place, and time;     recent and  remote memory intact;     language fluent;     normal attention, concentration,     fund of knowledge.  CRANIAL NERVES: CN II: Visual fields are full to confrontation. Fundoscopic exam is normal with sharp discs and no vascular changes. Pupil were equal round reactive to light  CN III, IV, VI: extraocular movement are normal. No ptosis. CN V: Facial sensation is intact to pinprick in all 3 divisions bilaterally. Corneal responses are intact.  CN VII: Face is symmetric with normal eye closure and smile. CN VIII: Hearing is normal to rubbing fingers CN IX, X: Palate elevates symmetrically. Phonation is normal. CN XI: Head turning and shoulder shrug are intact CN XII: Tongue is midline with normal movements and no atrophy.  MOTOR:.Bilateral hands posturing tremor, no rigidity. Muscle bulk and tone are normal. Muscle strength is normal.  REFLEXES: Reflexes are  3  and symmetric at the biceps, triceps, knees, and ankles. Plantar responses are flexor.  SENSORY: Light touch, pinprick, position sense, and vibration sense are intact in fingers and toes.  COORDINATION: Rapid alternating movements and fine finger movements are intact. There is no dysmetria on finger-to-nose and heel-knee-shin. There are no abnormal or extraneous movements.   GAIT/STANCE: Posture is normal. Gait is steady with normal steps, base, arm swing, and turning. Heel and toe walking are normal. Tandem gait is normal.  Romberg is absent.   DIAGNOSTIC DATA (LABS, IMAGING, TESTING) - I reviewed patient records, labs, notes, testing and imaging myself where available.  Lab Results  Component Value Date   WBC 4.1 10/14/2014   HGB 12.8 10/14/2014   HCT 37.6 10/14/2014   MCV 95.9 10/14/2014   PLT 182 10/14/2014      Component Value Date/Time   NA 143 10/14/2014 1345   K 4.1 10/14/2014 1345   CL 104 10/14/2014 1345   CO2 31 10/14/2014 1345   GLUCOSE 115* 10/14/2014 1345  BUN 8 10/14/2014 1345   CREATININE 0.70  10/14/2014 1345   CREATININE 0.64 12/19/2013 1040   CALCIUM 9.0 10/14/2014 1345   PROT 6.8 12/19/2013 1040   ALBUMIN 4.5 12/19/2013 1040   AST 31 12/19/2013 1040   ALT 32 12/19/2013 1040   ALKPHOS 36* 12/19/2013 1040   BILITOT 0.7 12/19/2013 1040   GFRNONAA >60 10/14/2014 1345   GFRAA >60 10/14/2014 1345   Lab Results  Component Value Date   CHOL 220* 12/19/2013   HDL 51 12/19/2013   LDLCALC 142* 12/19/2013   TRIG 135 12/19/2013   CHOLHDL 4.3 12/19/2013   ASSESSMENT AND PLAN  MAYANA IRIGOYEN is a 64 y.o. female    1. Bilateral lower extremity muscle cramping, occasionally involvement of bilateral upper extremity, hyperreflexia on examination, previous cervical decompression surgery, her symptoms are most consistent with cervical spondylitic myelopathy, will proceed with MRI of cervical spine, gabapentin 300 mg 1-2 tablets every night for bilateral lower extremity muscle cramping 2, essential tremor: Will try Inderal 20 mg twice a day, 3. Low back pain, radiating pain bilateral lower extremity, there was no significant foraminal canal stenosis by MRI of lumbar, I have suggested her moderate exercise, back stretching exercise, when necessary NSAIDs  4. Return to clinic in 3-4 weeks    Marcial Pacas, M.D. Ph.D.  [facility] [address], [place], [place] [address] Ph: [phone] Fax: [phone]

## 2014-11-01 ENCOUNTER — Ambulatory Visit (INDEPENDENT_AMBULATORY_CARE_PROVIDER_SITE_OTHER): Payer: 59

## 2014-11-01 DIAGNOSIS — M5440 Lumbago with sciatica, unspecified side: Secondary | ICD-10-CM

## 2014-11-01 DIAGNOSIS — G250 Essential tremor: Secondary | ICD-10-CM | POA: Diagnosis not present

## 2014-11-01 DIAGNOSIS — G959 Disease of spinal cord, unspecified: Secondary | ICD-10-CM | POA: Diagnosis not present

## 2014-11-20 ENCOUNTER — Ambulatory Visit (INDEPENDENT_AMBULATORY_CARE_PROVIDER_SITE_OTHER): Payer: 59 | Admitting: Neurology

## 2014-11-20 ENCOUNTER — Encounter: Payer: Self-pay | Admitting: Neurology

## 2014-11-20 VITALS — BP 110/79 | HR 68 | Ht 72.0 in | Wt 179.0 lb

## 2014-11-20 DIAGNOSIS — M5440 Lumbago with sciatica, unspecified side: Secondary | ICD-10-CM

## 2014-11-20 DIAGNOSIS — G959 Disease of spinal cord, unspecified: Secondary | ICD-10-CM | POA: Diagnosis not present

## 2014-11-20 DIAGNOSIS — G250 Essential tremor: Secondary | ICD-10-CM | POA: Diagnosis not present

## 2014-11-20 MED ORDER — PROPRANOLOL HCL 20 MG PO TABS: 20.0000 mg | ORAL_TABLET | Freq: Two times a day (BID) | ORAL | Status: DC

## 2014-11-20 NOTE — Progress Notes (Signed)
Chief Complaint  Patient presents with  . Spasms    Says gabapentin 32m, one tablet at bedtime is working to improve her muscle cramps at night.  . Tremors    Inderal has only slightly improved her tremors.  She still tends to have problems after a busy day and notices them being worse in the evenings.  . Cervical Pain    She would like to discuss her MRI results.      PATIENT: Angela TRITCHDOB: [birth year] Chief Complaint  Patient presents with  . Spasms    Says gabapentin 3072m one tablet at bedtime is working to improve her muscle cramps at night.  . Tremors    Inderal has only slightly improved her tremors.  She still tends to have problems after a busy day and notices them being worse in the evenings.  . Cervical Pain    She would like to discuss her MRI results.    HISTORICAL  Angela Ray a [age]ears old right-handed female, accompanied by her husband, seen in refer by her primary care physician Dr. DoDarcus Austinor evaluation of low back pain, radiating down to her legs, bilateral lower extremity cramping, bilateral hands shaking  I have reviewed referring note from Dr. DoDarcus Austinn Oct 04 2014,  Laboratory evaluation in Oct 04 2014, normal CBC, CMP with exception of mild elevated glucose 102, B12 1196, normal TSH 1.9  She has history of cervical decompression in May 2002, presented with right cervical radiculopathy, right-sided neck pain, radiating pain to right arm, right hand cramping, cervical decompression surgery has really helped her symptoms, she did not have gait difficulty, leg symptoms at that time  She has been very active all her life, including heavy lifting, had mild chronic low back pain, her symptoms has much worsened since fall of 2015, she noticed worsening low back pain, radiating pain to bilateral leg, mild unsteady gait, actually tripped and fell few times, in addition, she also complains of gradual worsening bilateral foot, leg muscle  cramping, sometimes difficulty falling to sleep because painful muscle cramps, spasm, lasting for hours. She also feels bilateral lower extremity weakness especially in early morning time, she has urinary urgency. Occasionally bilateral hands muscle cramping, paresthesia  Her mother has bilateral hands tremor in her seventies, CyAvonellegan to experience worsening hand tremor since 2015, difficulty holding a pen, using utensils, mild jaw tremor  We have personally reviewed MRI lumbar in October 16 2014: Multilevel mild generative disc disease, most severe at L5-S1, L4-5, L3 and 4, there was no significant foraminal or canal stenosis.   Over the past few months, she has tried Dilantin  Dilantin 100 mg 3 times a day in March 2016, tizanidine 4 mg 1 tablet at bedtime in August 23 2014,   For tremor, she was given primidone  since March 2016, titrating dose up to half tablets in the morning, one tablets every night, but could not tolerate the medications, "funny in head" wean herself off it  since Sep 15 2014, She was also given Lozone  7505mid, valacyclovir for possible shingles.  UPDATE November 20 2014: She started gabapentin 300m62mne tablet every night, which has been very helpful, she can sleep much better, She is also taking inderal 20mg25m, which has helped her tremor, but she continue has mild hands tremor at late evening, she has TMJ, noticed jaw tremor, bilateral TMC joints pain often    We have reviewed MRI of cervical  spine in July 2016: At C4-5: rightward disc bulging and uncovertebral joint hypertrophy with mild right foraminal stenosis. Anterior discectomy and fusion with metal hardware at C4-C5.  No intrinsic or compressive spinal cord lesions  She does complain frequent neck pain, radiating pain to right shoulder, right upper extremity.   REVIEW OF SYSTEMS: Full 14 system review of systems performed and notable only for fatigue, constipation, diarrhea, insomnia, daytime sleepiness,  snoring, urinary urgency, joints pain, low back pain, neck pain, headaches, depression anxiety   ALLERGIES: Allergies  Allergen Reactions  . Aciphex [Rabeprazole Sodium]   . Aspirin     REACTION: unspecified  . Bupropion Hcl     REACTION: unspecified  . Cephalexin     REACTION: unspecified  . Codeine   . Codeine Phosphate     REACTION: unspecified  . Doxycycline Hyclate     REACTION: unspecified  . Esomeprazole Magnesium   . Hydrocodone-Acetaminophen     REACTION: unspecified  . Oxycodone-Acetaminophen     REACTION: unspecified  . Sertraline Hcl     REACTION: unspecified    HOME MEDICATIONS: Current Outpatient Prescriptions  Medication Sig Dispense Refill  . busPIRone (BUSPAR) 15 MG tablet Take 15 mg by mouth 2 (two) times daily.     . cetirizine (ZYRTEC) 10 MG tablet Take 10 mg by mouth daily as needed for allergies.     . Cholecalciferol (VITAMIN D3) 2000 UNITS TABS Take by mouth daily.    . clonazePAM (KLONOPIN) 0.5 MG tablet Take 0.5 mg by mouth 3 (three) times daily. 1 tab three times a day    . cyclobenzaprine (FLEXERIL) 10 MG tablet Take 5 mg by mouth at bedtime as needed for muscle spasms.     . diclofenac sodium (VOLTAREN) 1 % GEL Apply 2 g topically as needed (artritisis).    . fluticasone (FLONASE) 50 MCG/ACT nasal spray Place 2 sprays into both nostrils at bedtime as needed for allergies.   4  . ibuprofen (ADVIL,MOTRIN) 600 MG tablet Take 600 mg by mouth every 6 (six) hours as needed (pain).    . Linaclotide (LINZESS) 145 MCG CAPS capsule Take 1 capsule (145 mcg total) by mouth daily. 90 capsule 3  . Magnesium 400 MG TABS Take by mouth 2 (two) times daily.    Marland Kitchen omeprazole (PRILOSEC) 20 MG capsule Take 20 mg by mouth every evening.   6  . OVER THE COUNTER MEDICATION Take 1 capsule by mouth 3 (three) times daily. Hydro eye    . PARoxetine (PAXIL) 40 MG tablet Take 40 mg by mouth daily.     . vitamin B-12 (CYANOCOBALAMIN) 250 MCG tablet Take 250 mcg by mouth daily.     . [DISCONTINUED] tolterodine (DETROL LA) 4 MG 24 hr capsule Take 4 mg by mouth daily. Needs name brand only--generic does not work for patient.     No current facility-administered medications for this visit.    PAST MEDICAL HISTORY: Past Medical History  Diagnosis Date  . GERD (gastroesophageal reflux disease)   . Vertigo   . Anxiety   . Hiatal hernia   . Depression   . Skin cancer   . Fibroid   . Osteoarthritis   . Fibromyalgia   . Spinal stenosis     lumbar  . Low back pain   . Tremors of nervous system   . Leg cramps   . Heat exhaustion     PAST SURGICAL HISTORY: Past Surgical History  Procedure Laterality Date  . Rotator cuff  repair Right 2011  . Cervical laminectomy  2002    C5/C6  . Cholecystectomy  2008  . Abdominal hysterectomy  2003    TAH still has ovaries--[name]    FAMILY HISTORY: Family History  Problem Relation Age of Onset  . Coronary artery disease Father     CABG 2002, followd by Dr, Ron Parker  . Prostate cancer Father   . Bladder Cancer Father   . Skin cancer Father   . Diabetes Father 33    DM type 2  . Atrial fibrillation Mother   . Multiple myeloma Mother   . Skin cancer Mother   . Coronary artery disease Mother   . Diabetes Maternal Grandmother   . Hypertension Maternal Grandmother   . Diabetes Maternal Grandfather   . Hypertension Maternal Grandfather   . Osteoarthritis Paternal Grandmother   . Colon cancer Neg Hx     SOCIAL HISTORY:  History   Social History  . Marital Status: Married    Spouse Name: [name]  . Number of Children: 0  . Years of Education: 12   Occupational History  . Retired    Social History Main Topics  . Smoking status: Never Smoker   . Smokeless tobacco: Never Used  . Alcohol Use: No  . Drug Use: No  . Sexual Activity:    Partners: Male    Birth Control/ Protection: Surgical     Comment: TAH still has ovaries   Other Topics Concern  . Not on file   Social History Narrative   Lives at home  with her husband.   Right-handed.   Rarely uses caffeine.     PHYSICAL EXAM   Filed Vitals:   11/20/14 1537  BP: 110/79  Pulse: 68  Height: 6' (1.829 m)  Weight: 179 lb (81.194 kg)    Not recorded      Body mass index is 24.27 kg/(m^2).  PHYSICAL EXAMNIATION:  Gen: NAD, conversant, well nourised, obese, well groomed                     Cardiovascular: Regular rate rhythm, no peripheral edema, warm, nontender. Eyes: Conjunctivae clear without exudates or hemorrhage Neck: Supple, no carotid bruise. Pulmonary: Clear to auscultation bilaterally   NEUROLOGICAL EXAM:  MENTAL STATUS: Speech:    Speech is normal; fluent and spontaneous with normal comprehension.  Cognition:    The patient is oriented to person, place, and time;     recent and remote memory intact;     language fluent;     normal attention, concentration,     fund of knowledge.  CRANIAL NERVES: CN II: Visual fields are full to confrontation. Fundoscopic exam is normal with sharp discs and no vascular changes. Pupil were equal round reactive to light  CN III, IV, VI: extraocular movement are normal. No ptosis. CN V: Facial sensation is intact to pinprick in all 3 divisions bilaterally. Corneal responses are intact.  CN VII: Face is symmetric with normal eye closure and smile. CN VIII: Hearing is normal to rubbing fingers CN IX, X: Palate elevates symmetrically. Phonation is normal. CN XI: Head turning and shoulder shrug are intact CN XII: Tongue is midline with normal movements and no atrophy.  MOTOR:.Bilateral hands posturing tremor, no rigidity. Muscle bulk and tone are normal. Muscle strength is normal.  REFLEXES: Reflexes are  3  and symmetric at the biceps, triceps, knees, and ankles. Plantar responses are flexor.  SENSORY: Light touch, pinprick, position sense, and vibration  sense are intact in fingers and toes.  COORDINATION: Rapid alternating movements and fine finger movements are intact.  There is no dysmetria on finger-to-nose and heel-knee-shin. There are no abnormal or extraneous movements.   GAIT/STANCE: Posture is normal. Gait is steady with normal steps, base, arm swing, and turning. Heel and toe walking are normal. Tandem gait is normal.  Romberg is absent.   DIAGNOSTIC DATA (LABS, IMAGING, TESTING) - I reviewed patient records, labs, notes, testing and imaging myself where available.  Lab Results  Component Value Date   WBC 4.1 10/14/2014   HGB 12.8 10/14/2014   HCT 37.6 10/14/2014   MCV 95.9 10/14/2014   PLT 182 10/14/2014      Component Value Date/Time   NA 143 10/14/2014 1345   K 4.1 10/14/2014 1345   CL 104 10/14/2014 1345   CO2 31 10/14/2014 1345   GLUCOSE 115* 10/14/2014 1345   BUN 8 10/14/2014 1345   CREATININE 0.70 10/14/2014 1345   CREATININE 0.64 12/19/2013 1040   CALCIUM 9.0 10/14/2014 1345   PROT 6.8 12/19/2013 1040   ALBUMIN 4.5 12/19/2013 1040   AST 31 12/19/2013 1040   ALT 32 12/19/2013 1040   ALKPHOS 36* 12/19/2013 1040   BILITOT 0.7 12/19/2013 1040   GFRNONAA >60 10/14/2014 1345   GFRAA >60 10/14/2014 1345   Lab Results  Component Value Date   CHOL 220* 12/19/2013   HDL 51 12/19/2013   LDLCALC 142* 12/19/2013   TRIG 135 12/19/2013   CHOLHDL 4.3 12/19/2013   ASSESSMENT AND PLAN  CARMELINA BALDUCCI is a 64 y.o. female    1. Bilateral lower extremity muscle cramping,  most consistent with residual symptoms from previous cervical spondylitic myelopathy, responded very well to gabapentin 2, essential tremor, responding to Inderal 20 mg twice a day, I have advised her to move the second dose Inderal to late afternoon   Marcial Pacas, M.D. Ph.D.  [facility] [address] [place], [place] [address] Ph: [phone] Fax: [phone]

## 2014-11-21 NOTE — Progress Notes (Signed)
Cardiology Office Note   Date:  11/21/2014   ID:  Angela Ray, DOB 1950-05-29, MRN [id number]  PCP:  Angela Smolder, MD  Cardiologist:  Dr. Lauree Chandler     Chief Complaint  Patient presents with  . Hospitalization Follow-up    EF visit 10/2014 for chest pain (new patient - not seen since 2/13)     History of Present Illness: Angela Ray is a 64 y.o. female with a hx of GERD, anxiety, vertigo. She was followed in the past by Dr. Tollie Eth for workup of palpitations, dyspnea and atypical chest pains. Last seen by Dr. Wynonia Lawman in 04/30/2006.  Event monitor demonstrated sinus tachycardia. Echocardiogram in 2007 is reported as normal. Exercise stress test in 2007 is reported as normal. It was felt that her symptoms at that time were mostly anxiety related. Evaluated by Dr. Lauree Chandler in 06/2011 for chest pain.  Echo demonstrated normal LVF with mod diastolic dysfunction and trivial AI.  ETT was normal.  No further workup was warranted.    She was seen in the ED last month for chest pain.  CEs and CXR were unremarkable.  She is seen back in follow up.    Studies/Reports Reviewed Today:  ETT 07/04/11 ETT Interpretation: normal - no evidence of ischemia by ST analysis  Echo 07/01/11 - Left ventricle: The cavity size was normal. Wall thickness was normal. Systolic function was normal. The estimated ejection fraction was in the range of 55% to 60%. Wall motion was normal; there were no regional wall motion abnormalities. Features are consistent with a pseudonormal left ventricular filling pattern, with concomitant abnormal relaxation and increased filling pressure (grade 2 diastolic dysfunction). - Aortic valve: Trivial regurgitation.   Past Medical History  Diagnosis Date  . GERD (gastroesophageal reflux disease)   . Vertigo   . Anxiety   . Hiatal hernia   . Depression   . Skin cancer   . Fibroid   . Osteoarthritis   .  Fibromyalgia   . Spinal stenosis     lumbar  . Low back pain   . Tremors of nervous system   . Leg cramps   . Heat exhaustion     Past Surgical History  Procedure Laterality Date  . Rotator cuff repair Right 2011  . Cervical laminectomy  2002    C5/C6  . Cholecystectomy  2008  . Abdominal hysterectomy  2003    TAH still has ovaries--[name]     Current Outpatient Prescriptions  Medication Sig Dispense Refill  . busPIRone (BUSPAR) 15 MG tablet Take 15 mg by mouth 2 (two) times daily.     . cetirizine (ZYRTEC) 10 MG tablet Take 10 mg by mouth daily as needed for allergies.     . Cholecalciferol (VITAMIN D3) 2000 UNITS TABS Take by mouth daily.    . clonazePAM (KLONOPIN) 0.5 MG tablet Take 0.5 mg by mouth 3 (three) times daily. 1 tab three times a day    . cyclobenzaprine (FLEXERIL) 10 MG tablet Take 5 mg by mouth at bedtime as needed for muscle spasms.     . diclofenac sodium (VOLTAREN) 1 % GEL Apply 2 g topically as needed (artritisis).    . fluticasone (FLONASE) 50 MCG/ACT nasal spray Place 2 sprays into both nostrils at bedtime as needed for allergies.   4  . gabapentin (NEURONTIN) 300 MG capsule One to 2 tabs po qhs 60 capsule 6  . ibuprofen (ADVIL,MOTRIN) 600 MG tablet Take 600 mg  by mouth every 6 (six) hours as needed (pain).    . Linaclotide (LINZESS) 145 MCG CAPS capsule Take 1 capsule (145 mcg total) by mouth daily. 90 capsule 3  . Magnesium 400 MG TABS Take by mouth 2 (two) times daily.    Marland Kitchen omeprazole (PRILOSEC) 20 MG capsule Take 20 mg by mouth every evening.   6  . OVER THE COUNTER MEDICATION Take 1 capsule by mouth 3 (three) times daily. Hydro eye    . PARoxetine (PAXIL) 40 MG tablet Take 40 mg by mouth daily.     . propranolol (INDERAL) 20 MG tablet Take 1 tablet (20 mg total) by mouth 2 (two) times daily. 60 tablet 11  . vitamin B-12 (CYANOCOBALAMIN) 250 MCG tablet Take 250 mcg by mouth daily.    . [DISCONTINUED] tolterodine (DETROL LA) 4 MG 24 hr capsule Take 4  mg by mouth daily. Needs name brand only--generic does not work for patient.     No current facility-administered medications for this visit.    Allergies:   Aciphex; Aspirin; Bupropion hcl; Cephalexin; Codeine; Codeine phosphate; Doxycycline hyclate; Esomeprazole magnesium; Hydrocodone-acetaminophen; Oxycodone-acetaminophen; and Sertraline hcl    Social History:  The patient  reports that she has never smoked. She has never used smokeless tobacco. She reports that she does not drink alcohol or use illicit drugs.   Family History:  The patient's family history includes Atrial fibrillation in her mother; Bladder Cancer in her father; Coronary artery disease in her father and mother; Diabetes in her maternal grandfather and maternal grandmother; Diabetes (age of onset: 65) in her father; Hypertension in her maternal grandfather and maternal grandmother; Multiple myeloma in her mother; Osteoarthritis in her paternal grandmother; Prostate cancer in her father; Skin cancer in her father and mother. There is no history of Colon cancer.    ROS:   Please see the history of present illness.   ROS    PHYSICAL EXAM: VS:  LMP 02/09/2002    Wt Readings from Last 3 Encounters:  11/20/14 179 lb (81.194 kg)  10/31/14 178 lb (80.74 kg)  10/24/14 178 lb (80.74 kg)     GEN: Well nourished, well developed, in no acute distress HEENT: normal Neck: no JVD, no carotid bruits, no masses Cardiac:  Normal S1/S2, RRR; no murmur ,  no rubs or gallops, no edema  Respiratory:  clear to auscultation bilaterally, no wheezing, rhonchi or rales. GI: soft, nontender, nondistended, + BS MS: no deformity or atrophy Skin: warm and dry  Neuro:  CNs II-XII intact, Strength and sensation are intact Psych: Normal affect   EKG:  EKG is ordered today.  It demonstrates:      Recent Labs: 12/19/2013: ALT 32 10/14/2014: BUN 8; Creatinine, Ser 0.70; Hemoglobin 12.8; Platelets 182; Potassium 4.1; Sodium 143    Lipid  Panel    Component Value Date/Time   CHOL 220* 12/19/2013 1040   TRIG 135 12/19/2013 1040   HDL 51 12/19/2013 1040   CHOLHDL 4.3 12/19/2013 1040   VLDL 27 12/19/2013 1040   LDLCALC 142* 12/19/2013 1040      ASSESSMENT AND PLAN:  Other chest pain     Medication Changes: Current medicines are reviewed at length with the patient today.  Concerns regarding medicines are as outlined above.  The following changes have been made:   Discontinued Medications   No medications on file   Modified Medications   No medications on file   New Prescriptions   No medications on file  Labs/ tests ordered today include:   No orders of the defined types were placed in this encounter.     Disposition:   FU with    Signed, Versie Starks, MHS 11/21/2014 11:54 PM    [facility] [place], [place], [place]  [address] Phone: [phone]; Fax: [phone]    This encounter was created in error - please disregard.

## 2014-11-22 ENCOUNTER — Encounter: Payer: 59 | Admitting: Physician Assistant

## 2014-11-26 NOTE — Progress Notes (Signed)
Cardiology Office Note   Date:  11/27/2014   ID:  Angela Ray, DOB [birth year], MRN [id number]  PCP:  Marjorie Smolder, MD  Cardiologist:  Dr. Lauree Chandler     Chief Complaint  Patient presents with  . Hospitalization Follow-up    EF visit 10/2014 for chest pain (new patient - not seen since 2/13)     History of Present Illness: Angela Ray is a 63 y.o. female with a hx of GERD, anxiety, vertigo. She was followed in the past by Dr. Tollie Eth for workup of palpitations, dyspnea and atypical chest pains. Last seen by Dr. Wynonia Lawman in 04/30/2006. Event monitor demonstrated sinus tachycardia. Echocardiogram in 2007 is reported as normal. Exercise stress test in 2007 is reported as normal. It was felt that her symptoms at that time were mostly anxiety related. Evaluated by Dr. Lauree Chandler in 06/2011 for chest pain. Echo demonstrated normal LVF with mod diastolic dysfunction and trivial AI. ETT was normal. No further workup was warranted.   She was seen in the ED last month for chest pain. CEs and CXR were unremarkable. She is seen back in follow up.   On the date that she went to the emergency room, she had been at a hot rod show. It was 95 outside. She had been outside for several hours. While walking across the parking lot, she developed substernal chest discomfort. This lasted only seconds. However, she had some achiness in her chest after that. Her blood pressure was elevated and she was lightheaded. 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 They called EMS. She was not given nitroglycerin because her blood pressures was in the 60Y systolically. Cardiac enzymes were negative 2 in the emergency room. Since that time, she denies any further chest discomfort. She is somewhat limited in her activity by chronic back pain. She also recently fractured her left ankle. She denies orthopnea, PND or edema. She denies  syncope.   Studies/Reports Reviewed Today:  ETT 07/04/11 ETT Interpretation: normal - no evidence of ischemia by ST analysis  Echo 07/01/11 - Left ventricle: EF 55% to 60%. Wall motion was normal; Grade2 diastolic dysfunction. - Aortic valve: Trivial regurgitation.   Past Medical History  Diagnosis Date  . GERD (gastroesophageal reflux disease)   . Vertigo   . Anxiety   . Hiatal hernia   . Depression   . Skin cancer   . Fibroid   . Osteoarthritis   . Fibromyalgia   . Spinal stenosis     lumbar  . Low back pain   . Tremors of nervous system   . Leg cramps   . Heat exhaustion     Past Surgical History  Procedure Laterality Date  . Rotator cuff repair Right 2011  . Cervical laminectomy  2002    C5/C6  . Cholecystectomy  2008  . Abdominal hysterectomy  2003    TAH still has ovaries--[name]     Current Outpatient Prescriptions  Medication Sig Dispense Refill  . busPIRone (BUSPAR) 15 MG tablet Take 15 mg by mouth 2 (two) times daily.     . cetirizine (ZYRTEC) 10 MG tablet Take 10 mg by mouth daily as needed for allergies.     . Cholecalciferol (VITAMIN D3) 2000 UNITS TABS Take by mouth daily.    . clonazePAM (KLONOPIN) 0.5 MG tablet Take 0.5 mg by mouth 3 (three) times daily. 1 tab three times a day    .  cyclobenzaprine (FLEXERIL) 10 MG tablet Take 5 mg by mouth at bedtime as needed for muscle spasms.     . diclofenac sodium (VOLTAREN) 1 % GEL Apply 2 g topically as needed (artritisis).    . fluticasone (FLONASE) 50 MCG/ACT nasal spray Place 2 sprays into both nostrils at bedtime as needed for allergies.   4  . gabapentin (NEURONTIN) 300 MG capsule One to 2 tabs po qhs 60 capsule 6  . ibuprofen (ADVIL,MOTRIN) 600 MG tablet Take 600 mg by mouth every 6 (six) hours as needed (pain).    . Linaclotide (LINZESS) 145 MCG CAPS capsule Take 1 capsule (145 mcg total) by mouth daily. 90 capsule 3  . omeprazole (PRILOSEC) 20 MG capsule Take 20 mg by mouth every evening.   6   . OVER THE COUNTER MEDICATION Take 1 capsule by mouth 3 (three) times daily. Hydro eye    . PARoxetine (PAXIL) 40 MG tablet Take 40 mg by mouth daily.     . propranolol (INDERAL) 20 MG tablet Take 1 tablet (20 mg total) by mouth 2 (two) times daily. 60 tablet 11  . traMADol (ULTRAM) 50 MG tablet Take 50 mg by mouth every 4 (four) hours as needed. (PAIN)  0  . vitamin B-12 (CYANOCOBALAMIN) 250 MCG tablet Take 250 mcg by mouth daily.    . [DISCONTINUED] tolterodine (DETROL LA) 4 MG 24 hr capsule Take 4 mg by mouth daily. Needs name brand only--generic does not work for patient.     No current facility-administered medications for this visit.    Allergies:   Aciphex; Bupropion hcl; Cephalexin; Codeine; Codeine phosphate; Esomeprazole magnesium; Hydrocodone-acetaminophen; Oxycodone-acetaminophen; Sertraline hcl; and Doxycycline hyclate    Social History:  The patient  reports that she has never smoked. She has never used smokeless tobacco. She reports that she does not drink alcohol or use illicit drugs.   Family History:  The patient's family history includes Atrial fibrillation in her mother; Bladder Cancer in her father; Coronary artery disease in her mother; Coronary artery disease (age of onset: 27) in her father; Diabetes in her maternal grandfather and maternal grandmother; Diabetes (age of onset: 69) in her father; Hypertension in her maternal grandfather and maternal grandmother; Multiple myeloma in her mother; Osteoarthritis in her paternal grandmother; Prostate cancer in her father; Skin cancer in her father and mother. There is no history of Colon cancer.    ROS:   Please see the history of present illness.   Review of Systems  HENT: Positive for ear pain.   Respiratory: Positive for snoring.   Skin: Positive for skin cancer.  Musculoskeletal: Positive for back pain, joint swelling and myalgias.       L ankle fracture  Gastrointestinal: Positive for constipation.  Neurological:  Positive for loss of balance.  Psychiatric/Behavioral: Positive for depression. The patient is nervous/anxious.   All other systems reviewed and are negative.     PHYSICAL EXAM: VS:  BP 108/82 mmHg  Pulse 60  Ht 5' 11.5" (1.816 m)  Wt 176 lb 6.4 oz (80.015 kg)  BMI 24.26 kg/m2  LMP 02/09/2002    Wt Readings from Last 3 Encounters:  11/27/14 176 lb 6.4 oz (80.015 kg)  11/20/14 179 lb (81.194 kg)  10/31/14 178 lb (80.74 kg)     GEN: Well nourished, well developed, in no acute distress HEENT: normal Neck: no JVD,  no masses Cardiac:  Normal S1/S2, RRR; no murmur ,  no rubs or gallops, no edema   Respiratory:  clear to auscultation bilaterally, no wheezing, rhonchi or rales. GI: soft, nontender, nondistended, + BS MS: no deformity or atrophy Skin: warm and dry  Neuro:  CNs II-XII intact, Strength and sensation are intact Psych: Normal affect   EKG:  EKG is ordered today.  It demonstrates:   NSR, HR 60, normal axis, low voltage, QTc 448 ms, no pre-excitation, no change from prior tracings.   Recent Labs: 12/19/2013: ALT 32 10/14/2014: BUN 8; Creatinine, Ser 0.70; Hemoglobin 12.8; Platelets 182; Potassium 4.1; Sodium 143    Lipid Panel    Component Value Date/Time   CHOL 220* 12/19/2013 1040   TRIG 135 12/19/2013 1040   HDL 51 12/19/2013 1040   CHOLHDL 4.3 12/19/2013 1040   VLDL 27 12/19/2013 1040   LDLCALC 142* 12/19/2013 1040      ASSESSMENT AND PLAN:  Other chest pain:  Symptoms are somewhat atypical  CEs were neg in the ED.  She does have a strong FHx for CAD. I recommend proceeding with an exercise treadmill test.  However, she cannot walk on a treadmill because of her lumbar DDD which causes significant leg pain. In addition, she cannot walk on a treadmill because of her L ankle fracture.  She will therefore be set up for a [employer].      Medication Changes: Current medicines are reviewed at length with the patient today.  Concerns regarding medicines  are as outlined above.  The following changes have been made:   Discontinued Medications   MAGNESIUM 400 MG TABS    Take by mouth 2 (two) times daily.   Modified Medications   No medications on file   New Prescriptions   No medications on file     Labs/ tests ordered today include:   Orders Placed This Encounter  Procedures  . Myocardial Perfusion Imaging  . EKG 12-Lead     Disposition:   FU with Dr. Lauree Chandler 1 year or sooner if Myoview abnormal.     Signed, Versie Starks, MHS 11/27/2014 5:29 PM    [facility] [address] [place], [place], [place]  [address] Phone: [phone]; Fax: [phone]

## 2014-11-27 ENCOUNTER — Ambulatory Visit (INDEPENDENT_AMBULATORY_CARE_PROVIDER_SITE_OTHER): Payer: 59 | Admitting: Physician Assistant

## 2014-11-27 ENCOUNTER — Encounter: Payer: Self-pay | Admitting: Physician Assistant

## 2014-11-27 VITALS — BP 108/82 | HR 60 | Ht 71.5 in | Wt 176.4 lb

## 2014-11-27 DIAGNOSIS — R0789 Other chest pain: Secondary | ICD-10-CM

## 2014-11-27 NOTE — Patient Instructions (Signed)
Medication Instructions:  Your physician recommends that you continue on your current medications as directed. Please refer to the Current Medication list given to you today.   Labwork: NONE  Testing/Procedures: Your physician has requested that you have a lexiscan myoview. For further information please visit [email or web address]. Please follow instruction sheet, as given.   Follow-Up: Your physician wants you to follow-up in: [place]. You will receive a reminder letter in the mail two months in advance. If you don't receive a letter, please call our office to schedule the follow-up appointment.   Any Other Special Instructions Will Be Listed Below (If Applicable).

## 2014-12-04 ENCOUNTER — Telehealth (HOSPITAL_COMMUNITY): Payer: Self-pay

## 2014-12-04 NOTE — Telephone Encounter (Signed)
Patient's husband (per DPR) given detailed instructions per Myocardial Perfusion Study Information Sheet for test on 12-07-2014 at 0830. Patient's husband Notified to arrive 15 minutes early, and that it is imperative to arrive on time for appointment to keep from having the test rescheduled. Patient's huusband verbalized understanding. Oletta Lamas, Niala Stcharles A

## 2014-12-07 ENCOUNTER — Ambulatory Visit (HOSPITAL_COMMUNITY): Payer: 59 | Attending: Cardiology

## 2014-12-07 DIAGNOSIS — R0789 Other chest pain: Secondary | ICD-10-CM

## 2014-12-07 DIAGNOSIS — R9439 Abnormal result of other cardiovascular function study: Secondary | ICD-10-CM | POA: Diagnosis not present

## 2014-12-07 LAB — MYOCARDIAL PERFUSION IMAGING
CHL CUP NUCLEAR SDS: 4
CHL CUP RESTING HR STRESS: 53 {beats}/min
CSEPPHR: 87 {beats}/min
LV sys vol: 25 mL
LVDIAVOL: 72 mL
RATE: 0.37
SRS: 8
SSS: 8
TID: 0.99

## 2014-12-07 MED ORDER — TECHNETIUM TC 99M SESTAMIBI GENERIC - CARDIOLITE
32.8000 | Freq: Once | INTRAVENOUS | Status: AC | PRN
  Administered 2014-12-07: 32.8 via INTRAVENOUS

## 2014-12-07 MED ORDER — REGADENOSON 0.4 MG/5ML IV SOLN
0.4000 mg | Freq: Once | INTRAVENOUS | Status: AC
Start: 2014-12-07 — End: 2014-12-07
  Administered 2014-12-07: 0.4 mg via INTRAVENOUS

## 2014-12-07 MED ORDER — TECHNETIUM TC 99M SESTAMIBI GENERIC - CARDIOLITE
10.5000 | Freq: Once | INTRAVENOUS | Status: AC | PRN
  Administered 2014-12-07: 11 via INTRAVENOUS

## 2014-12-11 ENCOUNTER — Encounter: Payer: Self-pay | Admitting: Physician Assistant

## 2014-12-12 ENCOUNTER — Telehealth: Payer: Self-pay | Admitting: *Deleted

## 2014-12-12 NOTE — Telephone Encounter (Signed)
Liliane Shi, PA-C,Sent: Mon December 11, 2014 8:39 PM   IE:PPIRJ Titus Dubin, CMA   Result Note    Please tell patient that her stress test is ok   No ischemi, i reviewed her study with Dr. Lauree Chandler.  Richardson Dopp, - pt aware

## 2014-12-18 ENCOUNTER — Telehealth: Payer: Self-pay

## 2014-12-18 NOTE — Telephone Encounter (Signed)
Spoke with patient to discuss Bone Density results we had received.  Advised per [name] it reveals Osteopenia in spine and right hip. [name] will discuss further at her annual exam in September.  Patient states has been diagnosed with another bulging disc at C3, has 3 bulging discs in LS spine and knows will discuss further at [facility] but just wanted to make [name] aware.  Routed to [name].

## 2014-12-18 NOTE — Telephone Encounter (Signed)
Thank you for the update.  Encounter closed. 

## 2015-01-09 ENCOUNTER — Other Ambulatory Visit: Payer: Self-pay

## 2015-01-09 MED ORDER — PROPRANOLOL HCL 20 MG PO TABS: 20.0000 mg | ORAL_TABLET | Freq: Two times a day (BID) | ORAL | Status: DC

## 2015-01-09 NOTE — Telephone Encounter (Signed)
Pharmacy requests 90 day Rx  

## 2015-01-12 ENCOUNTER — Ambulatory Visit: Payer: 59 | Admitting: Obstetrics and Gynecology

## 2015-01-24 ENCOUNTER — Other Ambulatory Visit: Payer: Self-pay

## 2015-01-24 MED ORDER — GABAPENTIN 300 MG PO CAPS: ORAL_CAPSULE | ORAL | Status: DC

## 2015-02-02 ENCOUNTER — Encounter: Payer: Self-pay | Admitting: Obstetrics and Gynecology

## 2015-02-02 ENCOUNTER — Telehealth: Payer: Self-pay | Admitting: Gastroenterology

## 2015-02-02 ENCOUNTER — Ambulatory Visit (INDEPENDENT_AMBULATORY_CARE_PROVIDER_SITE_OTHER): Payer: 59 | Admitting: Obstetrics and Gynecology

## 2015-02-02 VITALS — BP 110/80 | HR 60 | Resp 16 | Ht 70.5 in | Wt 178.0 lb

## 2015-02-02 DIAGNOSIS — Z0000 Encounter for general adult medical examination without abnormal findings: Secondary | ICD-10-CM | POA: Diagnosis not present

## 2015-02-02 DIAGNOSIS — K649 Unspecified hemorrhoids: Secondary | ICD-10-CM | POA: Diagnosis not present

## 2015-02-02 DIAGNOSIS — Z01419 Encounter for gynecological examination (general) (routine) without abnormal findings: Secondary | ICD-10-CM

## 2015-02-02 DIAGNOSIS — M8580 Other specified disorders of bone density and structure, unspecified site: Secondary | ICD-10-CM | POA: Diagnosis not present

## 2015-02-02 LAB — POCT URINALYSIS DIPSTICK
Bilirubin, UA: NEGATIVE
Blood, UA: NEGATIVE
Glucose, UA: NEGATIVE
KETONES UA: NEGATIVE
Leukocytes, UA: NEGATIVE
Nitrite, UA: NEGATIVE
PROTEIN UA: NEGATIVE
UROBILINOGEN UA: NEGATIVE
pH, UA: 5

## 2015-02-02 LAB — HEMOGLOBIN, FINGERSTICK: HEMOGLOBIN, FINGERSTICK: 13.6 g/dL (ref 12.0–16.0)

## 2015-02-02 MED ORDER — LIDOCAINE-HYDROCORTISONE ACE 3-0.5 % RE CREA: 1.0000 | TOPICAL_CREAM | Freq: Two times a day (BID) | RECTAL | Status: DC

## 2015-02-02 NOTE — Patient Instructions (Signed)

## 2015-02-02 NOTE — Progress Notes (Signed)
Patient ID: Angela Ray, female   DOB: June 23, 1950, 64 y.o.   MRN: [id number] 64 y.o. G0P0 Married Caucasian female here for annual exam.    Health problems this year.  UTIs, Sinusitis, ankle fracture, back pain after fracture, essential tremor, fibromyalgia, osteoarthritis, herniated discs. Taking Gabapentin for leg cramps  Resolved after one dosage.   Had an episode of heat intolerance and this resulted in cardiology consults.   Also having dental problems.   3 UTIs in 10 months.  Seeing [name].  Received vaginal Estrace for the urethra. Also on Trimethoprim for a short course. Told she also has a dropped bladder.  Try to sell their home and move to [facility].  Still caring for her father.   PCP:   Darcus Austin, MD Dr. Charlestine Night.  Patient's last menstrual period was 02/09/2002.          Sexually active: Yes.   female partner The current method of family planning is status post hysterectomy.    Exercising: No.   Smoker:  no  Health Maintenance: Pap:  11-20-10 Neg History of abnormal Pap:  no MMG:  11-28-14 Density Cat.B/Neg/BiRads2:[facility] Colonoscopy:  04-18-13 polyp/tortuous and redundant colon with Dr. Patterson;recommend repeat colonoscopy 1year. BMD:   11-28-14   Result  Osteopenia spine & Rt. HDQ:QIWLN TDaP:  January 2016.  Had shingles and pneumonia vaccines as well.  Screening Labs:  Hb today: 13.2, Urine today: Neg   reports that she has never smoked. She has never used smokeless tobacco. She reports that she does not drink alcohol or use illicit drugs.  Past Medical History  Diagnosis Date  . GERD (gastroesophageal reflux disease)   . Vertigo   . Anxiety   . Hiatal hernia   . Depression   . Skin cancer   . Fibroid   . Osteoarthritis   . Fibromyalgia   . Spinal stenosis     lumbar  . Low back pain   . Tremors of nervous system   . Leg cramps   . Heat exhaustion   . History of cardiovascular stress test     Lexiscan Myoview 7/16:  EF 65%, inferolateral  defect (prob artifactual), no ischemia  . Herniated disc, cervical     C3  . Lumbar herniated disc     L4,L5  . Essential tremor 2016    legs, arm,lower jaw  . Rheumatoid arthritis, adult 2016    both feet    Past Surgical History  Procedure Laterality Date  . Rotator cuff repair Right 2011  . Cervical laminectomy  2002    C5/C6  . Cholecystectomy  2008  . Abdominal hysterectomy  2003    TAH still has ovaries--[name]  . Fractured ankle Left 10/2014    Current Outpatient Prescriptions  Medication Sig Dispense Refill  . busPIRone (BUSPAR) 15 MG tablet Take 15 mg by mouth 2 (two) times daily.     . cetirizine (ZYRTEC) 10 MG tablet Take 10 mg by mouth daily as needed for allergies.     . Cholecalciferol (VITAMIN D3) 2000 UNITS TABS Take by mouth daily.    . clonazePAM (KLONOPIN) 0.5 MG tablet Take 0.5 mg by mouth 3 (three) times daily. 1 tab three times a day    . cyclobenzaprine (FLEXERIL) 10 MG tablet Take 5 mg by mouth at bedtime as needed for muscle spasms.     . diclofenac sodium (VOLTAREN) 1 % GEL Apply 2 g topically as needed (artritisis).    [name]  VAGINAL 0.1 MG/GM vaginal cream Apply 1 application topically daily. Apply small amount externally to vaginal area daily    . fluticasone (FLONASE) 50 MCG/ACT nasal spray Place 2 sprays into both nostrils at bedtime as needed for allergies.   4  . gabapentin (NEURONTIN) 300 MG capsule One to 2 tabs po qhs 180 capsule 1  . ibuprofen (ADVIL,MOTRIN) 600 MG tablet Take 600 mg by mouth every 6 (six) hours as needed (pain).    . Linaclotide (LINZESS) 145 MCG CAPS capsule Take 1 capsule (145 mcg total) by mouth daily. 90 capsule 3  . omeprazole (PRILOSEC) 20 MG capsule Take 20 mg by mouth every evening.   6  . OVER THE COUNTER MEDICATION Take 1 capsule by mouth 3 (three) times daily. Hydro eye    . PARoxetine (PAXIL) 40 MG tablet Take 40 mg by mouth daily.     . propranolol (INDERAL) 20 MG tablet Take 1 tablet (20 mg total) by mouth  2 (two) times daily. 180 tablet 3  . traMADol (ULTRAM) 50 MG tablet Take 50 mg by mouth every 4 (four) hours as needed. (PAIN)  0  . trimethoprim (TRIMPEX) 100 MG tablet Take 1 tablet by mouth daily.    . vitamin B-12 (CYANOCOBALAMIN) 250 MCG tablet Take 250 mcg by mouth daily.    . [DISCONTINUED] tolterodine (DETROL LA) 4 MG 24 hr capsule Take 4 mg by mouth daily. Needs name brand only--generic does not work for patient.     No current facility-administered medications for this visit.    Family History  Problem Relation Age of Onset  . Coronary artery disease Father 69    CABG 2002, followd by Dr, Ron Parker  . Prostate cancer Father   . Bladder Cancer Father   . Skin cancer Father   . Diabetes Father 72    DM type 2  . Atrial fibrillation Mother   . Multiple myeloma Mother   . Skin cancer Mother   . Coronary artery disease Mother   . Diabetes Maternal Grandmother   . Hypertension Maternal Grandmother   . Diabetes Maternal Grandfather   . Hypertension Maternal Grandfather   . Osteoarthritis Paternal Grandmother   . Colon cancer Neg Hx     ROS:  Pertinent items are noted in HPI.  Otherwise, a comprehensive ROS was negative.  Exam:   BP 110/80 mmHg  Pulse 60  Resp 16  Ht 5' 10.5" (1.791 m)  Wt 178 lb (80.74 kg)  BMI 25.17 kg/m2  LMP 02/09/2002    General appearance: alert, cooperative and appears stated age Head: Normocephalic, without obvious abnormality, atraumatic Neck: no adenopathy, supple, symmetrical, trachea midline and thyroid normal to inspection and palpation Lungs: clear to auscultation bilaterally Breasts: normal appearance, no masses or tenderness, Inspection negative, No nipple retraction or dimpling, No nipple discharge or bleeding, No axillary or supraclavicular adenopathy Heart: regular rate and rhythm Abdomen: soft, non-tender; bowel sounds normal; no masses,  no organomegaly Extremities: extremities normal, atraumatic, no cyanosis or edema Skin: Skin  color, texture, turgor normal. No rashes or lesions Lymph nodes: Cervical, supraclavicular, and axillary nodes normal. No abnormal inguinal nodes palpated Neurologic: Grossly normal  Pelvic: External genitalia:  no lesions              Urethra:  normal appearing urethra with no masses, tenderness or lesions              Bartholins and Skenes: normal  Vagina: normal appearing vagina with normal color and discharge, no lesions              Cervix: absent              Pap taken: No. Bimanual Exam:  Uterus:  uterus absent              Adnexa: normal adnexa and no mass, fullness, tenderness              Rectovaginal: Yes.  .  Confirms.              Anus:  normal sphincter tone, no lesions  Chaperone was present for exam.  Assessment:   Well woman visit with normal exam. Status post TAH.  Ovaries remain.  Multiple general medical issues.  Recurrent UTIs.  Osteopenia.   Plan: Yearly mammogram recommended after age 11.  Recommended self breast exam.  Pap and HR HPV as above. Discussed Calcium, Vitamin D, regular exercise program including cardiovascular and weight bearing exercise. Labs performed.  No..     Refills given on medications.  No..    I discussed vaginal estrogen with patient and the benefits for bladder and vaginal health.  She will continue Rx through her urologist.  I am recommending patient discuss her osteopenia and potential with her rheumatologist.  I did discuss bisphosfonates and Evista but due to side effects, these may not be ideal for patient.  Follow up annually and prn.   Additional counseling given regarding her difficult year and support given.  I did suggest patient and her husband consider taking a break and going on vacation for mental health reasons.  After visit summary provided.

## 2015-02-05 NOTE — Telephone Encounter (Signed)
Left message for pt to call back.  Pt states she is having problems with her hemorrhoids, states they are purple/black in color. States she saw her GYN last week and she proscribed anamantle cream and it was over 100.00. Her GYN told her to contact Dr. Ardis Hughs and see if he could help. Pt wants to know what else she can try for the hemorrhoids and also states she is due for her colonoscopy. Dr. Ardis Hughs please advise regarding hemorrhoids.

## 2015-02-05 NOTE — Telephone Encounter (Signed)
i don't think I've ever seen her before.   Reviewing her records DRP recommended repeat colonoscopy in one year (should have been a year ago) and it looks like we've sent her at least 2 reminder letters about this.  OK to schedule that colonoscopy at her soonest convenience.  For the hemorrhoids it's hard to say without seeing her in the office.  I recommend she try the prescription ointment, if it is cost prohibitive then OTC preparation H can be helpful 1 to 2 times per day.  If she wants, happy to see her in office but would not double book for this.  Can add her to wait list if she wants, can offer extender visit also.

## 2015-02-05 NOTE — Telephone Encounter (Signed)
Pt aware of recommendations and scheduled to see Lori Hvozdovic, PA-C  02/12/15@2 :15pm. Pt aware of appt.

## 2015-02-07 DIAGNOSIS — N390 Urinary tract infection, site not specified: Secondary | ICD-10-CM | POA: Insufficient documentation

## 2015-02-07 DIAGNOSIS — K589 Irritable bowel syndrome without diarrhea: Secondary | ICD-10-CM | POA: Insufficient documentation

## 2015-02-12 ENCOUNTER — Ambulatory Visit (INDEPENDENT_AMBULATORY_CARE_PROVIDER_SITE_OTHER): Payer: 59 | Admitting: Physician Assistant

## 2015-02-12 ENCOUNTER — Encounter: Payer: Self-pay | Admitting: Physician Assistant

## 2015-02-12 VITALS — BP 108/64 | HR 64 | Ht 70.75 in | Wt 179.1 lb

## 2015-02-12 DIAGNOSIS — K648 Other hemorrhoids: Secondary | ICD-10-CM

## 2015-02-12 DIAGNOSIS — K644 Residual hemorrhoidal skin tags: Secondary | ICD-10-CM

## 2015-02-12 DIAGNOSIS — K5909 Other constipation: Secondary | ICD-10-CM | POA: Diagnosis not present

## 2015-02-12 DIAGNOSIS — Z86010 Personal history of colonic polyps: Secondary | ICD-10-CM

## 2015-02-12 DIAGNOSIS — Z8719 Personal history of other diseases of the digestive system: Secondary | ICD-10-CM

## 2015-02-12 MED ORDER — NA SULFATE-K SULFATE-MG SULF 17.5-3.13-1.6 GM/177ML PO SOLN
1.0000 | Freq: Once | ORAL | Status: AC
Start: 2015-02-12 — End: 2015-03-14

## 2015-02-12 MED ORDER — HYDROCORTISONE 2.5 % RE CREA: 1.0000 "application " | TOPICAL_CREAM | Freq: Two times a day (BID) | RECTAL | Status: DC

## 2015-02-12 MED ORDER — LINACLOTIDE 145 MCG PO CAPS: 145.0000 ug | ORAL_CAPSULE | Freq: Every day | ORAL | Status: DC

## 2015-02-12 MED ORDER — OMEPRAZOLE 20 MG PO CPDR: DELAYED_RELEASE_CAPSULE | ORAL | Status: DC

## 2015-02-12 NOTE — Patient Instructions (Signed)
You have been scheduled for an endoscopy and colonoscopy. Please follow the written instructions given to you at your visit today. Please pick up your prep supplies at the pharmacy within the next 1-3 days. If you use inhalers (even only as needed), please bring them with you on the day of your procedure. We sent refills to [facility] [address]. 1. Suprep for colonoscopy 2. Linzess 145 mcg 3. Omeprazolen 20 mg 4. Anusol Cream  Get some Tucks wipes for hemorrhoids. Hemorrhoid handout.

## 2015-02-12 NOTE — Progress Notes (Addendum)
Patient ID: Angela Ray, female   DOB: Apr 02, 1951, 64 y.o.   MRN: [id number]     History of Present Illness: Angela Ray is a pleasant 64 year old female who was previously followed by Dr. Sharlett Iles. She had a colonoscopy on 04/18/2013 at which time a semi-pedunculated polyp measuring 1.5 cm in size was found in the descending colon. Polypectomy was performed using snare cautery. She was advised to have surveillance in 1 year.(Tissue was not retrieved). She tends to be constipated and uses a fiber cereal daily with a stool softener. She had been given a trial of Jeani Hawking status which resulted in immediate diarrhea. For this reason, she states she does not use it regularly. If she does not take a laxity of, she says she will skip 5 days between bowel movements. She has been experiencing some rectal pain with blood on the toilet tissue. She has tried Tucks hemorrhoid cream with some relief.  She also has a long-standing history of GERD for which she had been using ranitidine. This past winter she had a sinus infection and was advised to use omeprazole by her ENT physician, which she states works better. She had an EGD in 2010 at which time esophageal biopsies revealed intestinal metaplasia consistent with Barrett's, no dysplasia or malignancy was noted. Repeat endoscopy in 2014 showed LA class B esophagitis. Esophagus was dilated with a #54 [place] Maloney dilator. Biopsies were negative for H. pylori but unfortunately esophageal tissue biopsy results cannot be found at this time. Patient also had a 24-hour pH study on 12/20/2009 there was no evidence of acid reflux in either probe in any position. DeMeester score was 3.2. There were no reflux episodes longer than 2 minutes and the time the pH is less than 4 is normal in all positions. She had multiple recordings of belching but no correlation of symptoms and acid reflux occurrence. Esophageal manometry was also performed on 12/20/2009 and showed upper  esophageal sphincter had normal cord nation between pharyngeal contraction and cricopharyngeal relaxation. Lower esophageal mean pressure was normal at 30 mmHg with normal relaxation swallowing. With regards to esophageal motility, there were normally propagated peristalsis waves throughout the length of the esophagus both wet and dry swallows. Mean amplitude of duration as 183 mg of mercury. There is 100% peristalsis assessment: This is a normal esophageal manometry without evidence of an esophageal motility disorder. Patient reports that she has been experiencing a sensation of a lump in her chest and intermittent dysphagia. She is very worried about Barrett's.   Past Medical History  Diagnosis Date  . GERD (gastroesophageal reflux disease)   . Vertigo   . Anxiety   . Hiatal hernia   . Depression   . Skin cancer   . Fibroid   . Osteoarthritis   . Fibromyalgia   . Spinal stenosis     lumbar  . Low back pain   . Tremors of nervous system   . Leg cramps   . Heat exhaustion   . History of cardiovascular stress test     Lexiscan Myoview 7/16:  EF 65%, inferolateral defect (prob artifactual), no ischemia  . Herniated disc, cervical     C3  . Lumbar herniated disc     L4,L5  . Essential tremor 2016    legs, arm,lower jaw  . Rheumatoid arthritis, adult ([place]) 2016    both feet  . Barrett's esophagus     in the past  . Colon polyp   . IBS (irritable bowel syndrome)  constipation  . Female bladder prolapse   . Osteopenia     Past Surgical History  Procedure Laterality Date  . Rotator cuff repair Right 2011  . Cervical laminectomy  2002    C5/C6  . Cholecystectomy  2008  . Abdominal hysterectomy  2003    TAH still has ovaries--[name]  . Fractured ankle Left 10/2014   Family History  Problem Relation Age of Onset  . Coronary artery disease Father 31    CABG 2002, followd by Dr, Ron Parker  . Prostate cancer Father   . Bladder Cancer Father   . Skin cancer Father   .  Diabetes Father 46    DM type 2  . Atrial fibrillation Mother   . Multiple myeloma Mother   . Skin cancer Mother   . Coronary artery disease Mother   . Diabetes Maternal Grandmother   . Hypertension Maternal Grandmother   . Diabetes Maternal Grandfather   . Hypertension Maternal Grandfather   . Osteoarthritis Paternal Grandmother   . Colon cancer Neg Hx   . Irritable bowel syndrome Mother    Social History  Substance Use Topics  . Smoking status: Never Smoker   . Smokeless tobacco: Never Used  . Alcohol Use: No   Current Outpatient Prescriptions  Medication Sig Dispense Refill  . busPIRone (BUSPAR) 15 MG tablet Take 15 mg by mouth 2 (two) times daily.     . cetirizine (ZYRTEC) 10 MG tablet Take 10 mg by mouth daily as needed for allergies.     . Cholecalciferol (VITAMIN D3) 2000 UNITS TABS Take by mouth daily.    . clonazePAM (KLONOPIN) 0.5 MG tablet Take 0.5 mg by mouth 4 (four) times daily.     . cyclobenzaprine (FLEXERIL) 10 MG tablet Take 5 mg by mouth at bedtime as needed for muscle spasms.     . diclofenac sodium (VOLTAREN) 1 % GEL Apply 2 g topically as needed (artritisis).    [name] VAGINAL 0.1 MG/GM vaginal cream Apply 1 application topically daily. Apply small amount externally to vaginal area daily    . fluticasone (FLONASE) 50 MCG/ACT nasal spray Place 2 sprays into both nostrils at bedtime as needed for allergies.   4  . gabapentin (NEURONTIN) 300 MG capsule One to 2 tabs po qhs 180 capsule 1  . ibuprofen (ADVIL,MOTRIN) 600 MG tablet Take 600 mg by mouth every 6 (six) hours as needed (pain).    Marland Kitchen lidocaine-hydrocortisone (ANAMANTEL HC) 3-0.5 % CREA Place 1 Applicatorful rectally 2 (two) times daily. 85 g 0  . Linaclotide (LINZESS) 145 MCG CAPS capsule Take 1 capsule (145 mcg total) by mouth daily. 30 capsule 3  . omeprazole (PRILOSEC) 20 MG capsule Take 1 capsule 30 min prior to breakfast. 30 capsule 3  . OVER THE COUNTER MEDICATION Take 1 capsule by mouth 3 (three)  times daily. Hydro eye    . PARoxetine (PAXIL) 40 MG tablet Take 40 mg by mouth daily.     . propranolol (INDERAL) 20 MG tablet Take 1 tablet (20 mg total) by mouth 2 (two) times daily. 180 tablet 3  . traMADol (ULTRAM) 50 MG tablet Take 50 mg by mouth every 4 (four) hours as needed. (PAIN)  0  . trimethoprim (TRIMPEX) 100 MG tablet Take 1 tablet by mouth daily.    . vitamin B-12 (CYANOCOBALAMIN) 250 MCG tablet Take 250 mcg by mouth daily.    . hydrocortisone (ANUSOL-HC) 2.5 % rectal cream Place 1 application rectally 2 (two) times  daily. 30 g 0  . Na Sulfate-K Sulfate-Mg Sulf SOLN Take 1 kit by mouth once. 354 mL 0  . [DISCONTINUED] tolterodine (DETROL LA) 4 MG 24 hr capsule Take 4 mg by mouth daily. Needs name brand only--generic does not work for patient.     No current facility-administered medications for this visit.   Allergies  Allergen Reactions  . Aciphex [Rabeprazole Sodium] Other (See Comments)    REACTION: "FELT LIKE INDIGESTION"  . Bupropion Hcl Other (See Comments)    REACTION: "DIDN'T MAKE ME FEEL RIGHT IN THE HEAD"  . Cephalexin Nausea Only and Other (See Comments)    REACTION: "GAVE YEAST INFECTION"  . Codeine Other (See Comments)    REACTION: "SEVERE NAUSEA, HEART PALPITATION, PASSING OUT, HEART ATTACK LIKE SYMPTOMS"  . Codeine Phosphate Other (See Comments)    REACTION: "SEVERE NAUSEA, HEART PALPITATION, PASSING OUT, HEART ATTACK LIKE SYMPTOMS"  . Esomeprazole Magnesium Other (See Comments)    REACTION: " CAUSE TASTE BUDS TO FALL OUT"  . Hydrocodone-Acetaminophen Other (See Comments)    REACTION: "SEVERE NAUSEA, HEART PALPITATION, PASSING OUT, HEART ATTACK LIKE SYMPTOMS"  . Oxycodone-Acetaminophen Other (See Comments)    REACTION: "SEVERE NAUSEA, HEART PALPITATION, PASSING OUT, HEART ATTACK LIKE SYMPTOMS"  . Sertraline Hcl Other (See Comments)    REACTION: "DIDN'T MAKE ME FEEL RIGHT IN THE HEAD"  . Doxycycline Hyclate Nausea Only     Review of Systems: Gen:  Denies any fever, chills, sweats, anorexia, fatigue, weakness, malaise, weight loss, and sleep disorder CV: Denies chest pain, angina, palpitations, syncope, orthopnea, PND, peripheral edema, and claudication. Resp: Denies dyspnea at rest, dyspnea with exercise, cough, sputum, wheezing, coughing up blood, and pleurisy. GI: Denies vomiting blood, jaundice, and fecal incontinence.  Has intermittent dysphagia to solids. GU : Denies urinary burning, blood in urine, urinary frequency, urinary hesitancy, nocturnal urination, and urinary incontinence. MS: Denies joint pain, limitation of movement, and swelling, stiffness, low back pain, extremity pain. Denies muscle weakness, cramps, atrophy.  Derm: Denies rash, itching, dry skin, hives, moles, warts, or unhealing ulcers.  Psych: Denies depression, anxiety, memory loss, suicidal ideation, hallucinations, paranoia, and confusion. Heme: Denies bruising, bleeding, and enlarged lymph nodes. Neuro:  Denies any headaches, dizziness, paresthesia Endo:  Denies any problems with DM, thyroid, adrenal    Physical Exam: BP 108/64 mmHg  Pulse 64  Ht 5' 10.75" (1.797 m)  Wt 179 lb 2 oz (81.251 kg)  BMI 25.16 kg/m2  LMP 02/09/2002 General: Pleasant, well developed , Caucasian female in no acute distress Head: Normocephalic and atraumatic Eyes:  sclerae anicteric, conjunctiva pink  Ears: Normal auditory acuity Lungs: Clear throughout to auscultation Heart: Regular rate and rhythm Abdomen: Soft, non distended, non-tender. No masses, no hepatomegaly. Normal bowel sounds Rectal: Large, nonthrombosed external hemorrhoids noted. Anoscopy reveals right anterior and left posterior internal hemorrhoids.  Musculoskeletal: Symmetrical with no gross deformities  Extremities: No edema  Neurological: Alert oriented x 4, grossly nonfocal Psychological:  Alert and cooperative. Normal mood and affect  Assessment and Recommendations:  #1 chronic functional constipation.  Patient will be given another trial of linzess. It was explained to her that she may have diarrhea for several days but this should subside. She is to call us in several weeks and let us know if it is helping, or to contact us sooner if she has excessive diarrhea, etc as we do not want her to get dehydrated, etc.  #2. Personal history colon polyps. Patient is to be scheduled for surveillance colonoscopy to  evaluate for recurrent polyps or neoplasia.The risks, benefits, and alternatives to colonoscopy with possible biopsy and possible polypectomy were discussed with the patient and they consent to proceed.   #3. GERD and history of Barrett's esophagus. An antireflux regimen has been reviewed at length. She will continue omeprazole 20 mg 1 by mouth every morning 30 minutes prior to breakfast. She will be scheduled for an EGD to assess for esophagitis, subtle stricture, Barrett's etc.The risks, benefits, and alternatives to endoscopy with possible biopsy and possible dilation were discussed with the patient and they consent to proceed.   #4. Internal and external hemorrhoids. Patient has been instructed to avoid constipation and try to avoid straining. He will use Tucks wipes. She will use Anusol cream to be applied rectally twice daily for 10 days. She has been provided a hemorrhoid handout.  Further recommendations will be made pending the findings of her colonoscopy and endoscopy. If at the time of colonoscopy her internal hemorrhoids are felt to be of adequate size for banding, she would like to discuss banding further with Dr. Silverio Decamp.        Trong Gosling, Vita Barley PA-C 02/12/2015,

## 2015-02-13 ENCOUNTER — Telehealth: Payer: Self-pay | Admitting: *Deleted

## 2015-02-13 NOTE — Telephone Encounter (Signed)
Patient notified of recommendation. 

## 2015-02-13 NOTE — Progress Notes (Signed)
Reviewed and agree with management plan. Likely functional globus sensation but given h/o erosive esophagitis will consider repeat egd for evaluation of mucosa and agree with low dose PPI once daily  K Denzil Magnuson MD

## 2015-02-13 NOTE — Telephone Encounter (Signed)
Angela P Hvozdovic, PA-C  Hulan Saas, RN           Cythia Belkin       Previous Messages     ----- Message -----   From: Hulan Saas, RN   Sent: 02/13/2015 11:45 AM    To: Vita Barley Hvozdovic, PA-C   Cecille Rubin,  Who is the patient?  [name]  ----- Message -----   From: Vita Barley Hvozdovic, PA-C   Sent: 02/13/2015 11:31 AM    To: Hulan Saas, RN   Rollene Fare, could you please let pt know I discuseed her with Dr Silverio Decamp, who agrees with retrial or linzess 145 mcg daily. Let he know she may have diarrhea at first but that should pass. BUT if the diarrhea is escessive she should contact us ASAP as we don not want her to get dehydrated, etc. Thanks, Cecille Rubin

## 2015-03-06 ENCOUNTER — Other Ambulatory Visit: Payer: Self-pay | Admitting: *Deleted

## 2015-03-06 MED ORDER — LINACLOTIDE 145 MCG PO CAPS: 145.0000 ug | ORAL_CAPSULE | Freq: Every day | ORAL | Status: DC

## 2015-03-24 ENCOUNTER — Other Ambulatory Visit: Payer: Self-pay | Admitting: Physician Assistant

## 2015-04-04 ENCOUNTER — Ambulatory Visit (AMBULATORY_SURGERY_CENTER): Payer: 59 | Admitting: Gastroenterology

## 2015-04-04 ENCOUNTER — Encounter: Payer: Self-pay | Admitting: Gastroenterology

## 2015-04-04 VITALS — BP 105/56 | HR 56 | Temp 97.0°F | Resp 15 | Ht 70.75 in | Wt 179.0 lb

## 2015-04-04 DIAGNOSIS — K2970 Gastritis, unspecified, without bleeding: Secondary | ICD-10-CM | POA: Diagnosis not present

## 2015-04-04 DIAGNOSIS — D124 Benign neoplasm of descending colon: Secondary | ICD-10-CM

## 2015-04-04 DIAGNOSIS — Z8719 Personal history of other diseases of the digestive system: Secondary | ICD-10-CM

## 2015-04-04 DIAGNOSIS — D120 Benign neoplasm of cecum: Secondary | ICD-10-CM

## 2015-04-04 DIAGNOSIS — K209 Esophagitis, unspecified without bleeding: Secondary | ICD-10-CM

## 2015-04-04 DIAGNOSIS — K2990 Gastroduodenitis, unspecified, without bleeding: Secondary | ICD-10-CM

## 2015-04-04 DIAGNOSIS — Z86010 Personal history of colonic polyps: Secondary | ICD-10-CM

## 2015-04-04 MED ORDER — SODIUM CHLORIDE 0.9 % IV SOLN: 500.0000 mL | INTRAVENOUS | Status: DC

## 2015-04-04 MED ORDER — NYSTATIN 100000 UNIT/ML MT SUSP: OROMUCOSAL | Status: AC

## 2015-04-04 MED ORDER — NYSTATIN 100000 UNIT/GM EX POWD: 1.0000 g | Freq: Three times a day (TID) | CUTANEOUS | Status: DC

## 2015-04-04 NOTE — Op Note (Signed)
[place]  [address]. [place], [id number]   COLONOSCOPY PROCEDURE REPORT  PATIENT: Angela Ray, Angela Ray  MR#: [id number] BIRTHDATE: 1950/12/22 , 64  yrs. old GENDER: female ENDOSCOPIST: Harl Bowie, MD REFERRED [id number] Inda Merlin, M.D. PROCEDURE DATE:  04/04/2015 PROCEDURE:   Colonoscopy with cold biopsy polypectomy First Screening Colonoscopy - Avg.  risk and is 50 yrs.  old or older - No.  Prior Negative Screening - Now for repeat screening. [name]  History of Adenoma - Now for follow-up colonoscopy & has been > or = to 3 yrs.  Yes hx of adenoma.  Has been 3 or more years since last colonoscopy.  Polyps removed today? Yes ASA CLASS:   Class II INDICATIONS:Surveillance due to prior colonic neoplasia and PH Colon Adenoma. MEDICATIONS: Residual sedation present  DESCRIPTION OF PROCEDURE:   After the risks benefits and alternatives of the procedure were thoroughly explained, informed consent was obtained.  The digital rectal exam revealed no rectal mass, prolapsed rectum.   The LB [id number]  endoscope was introduced through the anus and advanced to the terminal ileum which was intubated for a short distance. No adverse events experienced.   The quality of the prep was good.  The instrument was then slowly withdrawn as the colon was fully examined. Estimated blood loss is zero unless otherwise noted in this procedure report.  COLON FINDINGS: Two sessile polyps ranging between 3-62mm in size were found in the descending colon and at the cecum.  A polypectomy was performed with cold forceps.  The resection was complete, the polyp tissue was completely retrieved and sent to histology. Retroflexed views revealed internal hemorrhoids. The time to cecum = 9.5 Withdrawal time = 18.8   The scope was withdrawn and the procedure completed. COMPLICATIONS: There were no immediate complications.  ENDOSCOPIC IMPRESSION: Prolapsed rectum Two sessile polyps ranging  between 3-38mm in size were found in the descending colon and at the cecum; polypectomy was performed with cold forceps  RECOMMENDATIONS: 1.  Await pathology results 2.  If the polyp(s) removed today are proven to be adenomatous (pre-cancerous) polyps, you will need a repeat colonoscopy in 5 years.  Otherwise you should continue to follow colorectal cancer screening guidelines for "routine risk" patients with colonoscopy in 10 years.  You will receive a letter within 1-2 weeks with the results of your biopsy as well as final recommendations.  Please call my office if you have not received a letter after 3 weeks.  eSigned:  Harl Bowie, MD 04/04/2015 10:36 AM

## 2015-04-04 NOTE — Progress Notes (Signed)
Dental advisory given to patient 

## 2015-04-04 NOTE — Addendum Note (Signed)
Addended by: Mauri Pole on: 04/04/2015 11:38 AM   Modules accepted: Orders

## 2015-04-04 NOTE — Progress Notes (Signed)
A/ox3 pleased with MAC, report to Wendy RN 

## 2015-04-04 NOTE — Patient Instructions (Addendum)

## 2015-04-04 NOTE — Op Note (Signed)
[place]  [address]. [place], [address]   ENDOSCOPY PROCEDURE REPORT  PATIENT: Angela Ray, Angela Ray  MR#: [id number] BIRTHDATE: 1950-11-16 , 64  yrs. old GENDER: female ENDOSCOPIST: Harl Bowie, MD REFERRED BY:  Darcus Austin, M.D. PROCEDURE DATE:  04/04/2015 PROCEDURE:  EGD w/ biopsy and EGD w/ biopsy for H.pylori ASA CLASS:     Class II INDICATIONS:  screening for Barrett's, dysphagia, and heartburn. MEDICATIONS: Propofol 350 mg IV TOPICAL ANESTHETIC: none  DESCRIPTION OF PROCEDURE: After the risks benefits and alternatives of the procedure were thoroughly explained, informed consent was obtained.  The LB [id number] endoscope was introduced through the mouth and advanced to the second portion of the duodenum , Without limitations.  The instrument was slowly withdrawn as the mucosa was fully examined.    Esophagus:irregular Z line at 38 cm from incisors, random biopsies were obtained from GE junction. White adherent plaques in the mid esophagus, random biopsies obtained tomach:Findings of gastropathy with with erosions in the antrum, random biopsies obtained to rule out H.  pylori Duodenum appeared normal.  Retroflexed views revealed as previously described.     The scope was then withdrawn from the patient and the procedure completed.  COMPLICATIONS: There were no immediate complications.  ENDOSCOPIC IMPRESSION: irregular Z line likely Candida esophagitis Gastropathy  RECOMMENDATIONS: await biopsy results Nystatin swish and swallow 3 times daily for one week we'll proceed with colonoscopy  eSigned:  Harl Bowie, MD 04/04/2015 10:31 AM

## 2015-04-04 NOTE — Progress Notes (Signed)
Called to room to assist during endoscopic procedure.  Patient ID and intended procedure confirmed with present staff. Received instructions for my participation in the procedure from the performing physician.  

## 2015-04-04 NOTE — Progress Notes (Signed)
Patient denies any allergies to eggs or soy. 

## 2015-04-09 ENCOUNTER — Telehealth: Payer: Self-pay | Admitting: *Deleted

## 2015-04-09 NOTE — Telephone Encounter (Signed)
Advise patient to continue taking Linzess and drink adequate fluids (8-10 glasses per day)

## 2015-04-09 NOTE — Telephone Encounter (Signed)
  Follow up Call-  Call back number 04/04/2015 04/18/2013  Post procedure Call Back phone  # [phone]  Permission to leave phone message Yes Yes     Patient questions:  Do you have a fever, pain , or abdominal swelling? No. Pain Score  0 *  Have you tolerated food without any problems? Yes.    Have you been able to return to your normal activities? Yes.    Do you have any questions about your discharge instructions: Diet   No. Medications  No. Follow up visit  No.  Do you have questions or concerns about your Care? Yes.    Actions: * If pain score is 4 or above: No action needed, pain <4.  Pt. States that she has not had bowel movement since her endoscopy and colonoscopy last Wednesday.. Denies abdominal pain, distention, fever, or any other complaints.  States she has hx. Of IBS with constipation and diarrhea.  States she has more occurences of constipation than diarrhea.  She takes linzess daily and has had stool softner x 1 since procedure.  She is eating normally without problems.  Any advice or directives?

## 2015-04-09 NOTE — Telephone Encounter (Signed)
Dr. Silverio Decamp advised that pt. Continue Linzess and drink 8-10 glasses of water daily.  Pt. Notified.

## 2015-04-11 ENCOUNTER — Ambulatory Visit: Payer: 59 | Admitting: Obstetrics and Gynecology

## 2015-04-17 ENCOUNTER — Encounter: Payer: Self-pay | Admitting: Gastroenterology

## 2015-04-24 ENCOUNTER — Encounter: Payer: Self-pay | Admitting: Gastroenterology

## 2015-04-30 ENCOUNTER — Other Ambulatory Visit: Payer: Self-pay

## 2015-04-30 ENCOUNTER — Telehealth: Payer: Self-pay | Admitting: Gastroenterology

## 2015-04-30 ENCOUNTER — Other Ambulatory Visit: Payer: Self-pay | Admitting: Gastroenterology

## 2015-04-30 NOTE — Telephone Encounter (Signed)
Recall updated to be done in 3 years

## 2015-04-30 NOTE — Telephone Encounter (Signed)
See the colon biopsy results. The patient is not comfortable with waiting for 5 years before repeating the screening. Can she have a sooner recall? Please advise.

## 2015-04-30 NOTE — Telephone Encounter (Signed)
See below-patient wants to be recalled sooner for the colonoscopy

## 2015-04-30 NOTE — Telephone Encounter (Signed)
Called back patient, she was worried about waiting given on colonoscopy in 2014 she had a large polyp that was removed but not retrieved to submit to pathology. We can schedule for recall colonoscopy and EGD in 3 years

## 2015-05-22 ENCOUNTER — Ambulatory Visit: Payer: 59 | Admitting: Neurology

## 2015-05-25 ENCOUNTER — Ambulatory Visit: Payer: 59 | Admitting: Neurology

## 2015-05-25 ENCOUNTER — Ambulatory Visit (INDEPENDENT_AMBULATORY_CARE_PROVIDER_SITE_OTHER): Payer: 59 | Admitting: Neurology

## 2015-05-25 ENCOUNTER — Encounter: Payer: Self-pay | Admitting: Neurology

## 2015-05-25 VITALS — BP 105/65 | HR 68 | Resp 20 | Ht 70.5 in | Wt 184.0 lb

## 2015-05-25 DIAGNOSIS — R251 Tremor, unspecified: Secondary | ICD-10-CM

## 2015-05-25 DIAGNOSIS — R202 Paresthesia of skin: Secondary | ICD-10-CM | POA: Diagnosis not present

## 2015-05-25 DIAGNOSIS — N3946 Mixed incontinence: Secondary | ICD-10-CM

## 2015-05-25 MED ORDER — PROPRANOLOL HCL 40 MG PO TABS: 40.0000 mg | ORAL_TABLET | Freq: Two times a day (BID) | ORAL | Status: DC

## 2015-05-25 MED ORDER — GABAPENTIN 300 MG PO CAPS: 300.0000 mg | ORAL_CAPSULE | Freq: Three times a day (TID) | ORAL | Status: DC

## 2015-05-25 NOTE — Progress Notes (Signed)
Chief Complaint  Patient presents with  . Follow-up    tremors are not as extreme, says she saw [facility], Dr. Ellene Route, she has bulging discs but they don't need surgery  . Tremors    inderal working well  . Spasms    gabapentin is helping   Chief Complaint  Patient presents with  . Follow-up    tremors are not as extreme, says she saw [facility], Dr. Ellene Route, she has bulging discs but they don't need surgery  . Tremors    inderal working well  . Spasms    gabapentin is helping      PATIENT: Angela Ray DOB: [birth year]  Chief Complaint  Patient presents with  . Follow-up    tremors are not as extreme, says she saw [facility], Dr. Ellene Route, she has bulging discs but they don't need surgery  . Tremors    inderal working well  . Spasms    gabapentin is helping    HISTORICAL  Angela Ray is a 65 years old right-handed female, accompanied by her husband, seen in refer by her primary care physician Dr. Darcus Austin for evaluation of low back pain, radiating down to her legs, bilateral lower extremity cramping, bilateral hands shaking  I have reviewed referring note from Dr. Darcus Austin in Oct 04 2014,  Laboratory evaluation in Oct 04 2014, normal CBC, CMP with exception of mild elevated glucose 102, B12 1196, normal TSH 1.9  She has history of cervical decompression in May 2002, presented with right cervical radiculopathy, right-sided neck pain, radiating pain to right arm, right hand cramping, cervical decompression surgery has really helped her symptoms, she did not have gait difficulty, leg symptoms at that time  She has been very active all her life, including heavy lifting, had mild chronic low back pain, her symptoms has much worsened since fall of 2015, she noticed worsening low back pain, radiating pain to bilateral leg, mild unsteady gait, actually tripped and fell few times, in addition, she also complains of gradual worsening bilateral foot, leg muscle cramping, sometimes  difficulty falling to sleep because painful muscle cramps, spasm, lasting for hours. She also feels bilateral lower extremity weakness especially in early morning time, she has urinary urgency. Occasionally bilateral hands muscle cramping, paresthesia  Her mother has bilateral hands tremor in her seventies, Angela Ray began to experience worsening hand tremor since 2015, difficulty holding a pen, using utensils, mild jaw tremor  We have personally reviewed MRI lumbar in October 16 2014: Multilevel mild generative disc disease, most severe at L5-S1, L4-5, L3 and 4, there was no significant foraminal or canal stenosis.   Over the past few months, she has tried Dilantin  Dilantin 100 mg 3 times a day in March 2016, tizanidine 4 mg 1 tablet at bedtime in August 23 2014,   For tremor, she was given primidone  since March 2016, titrating dose up to half tablets in the morning, one tablets every night, but could not tolerate the medications, "funny in head" wean herself off it  since Sep 15 2014, She was also given Lozone  738m tid, valacyclovir for possible shingles.  UPDATE November 20 2014: She started gabapentin 3037m one tablet every night, which has been very helpful, she can sleep much better, She is also taking inderal 2021mid, which has helped her tremor, but she continue has mild hands tremor at late evening, she has TMJ, noticed jaw tremor, bilateral TMC joints pain often    We have reviewed MRI  of cervical spine in July 2016: At C4-5: rightward disc bulging and uncovertebral joint hypertrophy with mild right foraminal stenosis. Anterior discectomy and fusion with metal hardware at C4-C5.  No intrinsic or compressive spinal cord lesions  She does complain frequent neck pain, radiating pain to right shoulder, right upper extremity.  MRI of lumbar showedt L5-S1. , there is a possible chronic disc extrusion adjacent to the right S1 nerve root, unchanged from 2009. This could contribute to right S1 nerve  root encroachment.  Mildly progressive disc degeneration at L3-4 and L4-5. Previously demonstrated left foraminal disc protrusion at L3-4 has partially involuted.  UPDATE May 25 2015: Gabapentin has helped her right low back pain, radiating pain to her leg, she still has tremors intermittent now, reported 50% improvement with low-dose propanolol 20 mg twice a day, she was recently evaluated by Dr. Ellene Route, had more extensive MRI studies,  I have personally reviewed MRI of thoracic spine no acute abnormality, no evidence of canal foraminal stenosis,  MRI of the brain showed mild generalized atrophy  REVIEW OF SYSTEMS: Full 14 system review of systems performed and notable only for as above  ALLERGIES: Allergies  Allergen Reactions  . Aciphex [Rabeprazole Sodium] Other (See Comments)    REACTION: "FELT LIKE INDIGESTION"  . Bupropion Hcl Other (See Comments)    REACTION: "DIDN'T MAKE ME FEEL RIGHT IN THE HEAD"  . Cephalexin Nausea Only and Other (See Comments)    REACTION: "GAVE YEAST INFECTION"  . Codeine Other (See Comments)    REACTION: "SEVERE NAUSEA, HEART PALPITATION, PASSING OUT, HEART ATTACK LIKE SYMPTOMS"  . Codeine Phosphate Other (See Comments)    REACTION: "SEVERE NAUSEA, HEART PALPITATION, PASSING OUT, HEART ATTACK LIKE SYMPTOMS"  . Esomeprazole Magnesium Other (See Comments)    Nexium=REACTION: " CAUSE TASTE BUDS TO FALL OUT"  . Hydrocodone-Acetaminophen Other (See Comments)    REACTION: "SEVERE NAUSEA, HEART PALPITATION, PASSING OUT, HEART ATTACK LIKE SYMPTOMS"  . Oxycodone-Acetaminophen Other (See Comments)    REACTION: "SEVERE NAUSEA, HEART PALPITATION, PASSING OUT, HEART ATTACK LIKE SYMPTOMS"  . Sertraline Hcl Other (See Comments)    REACTION: "DIDN'T MAKE ME FEEL RIGHT IN THE HEAD"  . Doxycycline Hyclate Nausea Only    HOME MEDICATIONS: Current Outpatient Prescriptions  Medication Sig Dispense Refill  . busPIRone (BUSPAR) 15 MG tablet Take 15 mg by mouth 2 (two)  times daily.     . cetirizine (ZYRTEC) 10 MG tablet Take 10 mg by mouth daily as needed for allergies.     . Cholecalciferol (VITAMIN D3) 2000 UNITS TABS Take by mouth daily.    . clonazePAM (KLONOPIN) 0.5 MG tablet Take 0.5 mg by mouth 3 (three) times daily. 1 tab three times a day    . cyclobenzaprine (FLEXERIL) 10 MG tablet Take 5 mg by mouth at bedtime as needed for muscle spasms.     . diclofenac sodium (VOLTAREN) 1 % GEL Apply 2 g topically as needed (artritisis).    . fluticasone (FLONASE) 50 MCG/ACT nasal spray Place 2 sprays into both nostrils at bedtime as needed for allergies.   4  . ibuprofen (ADVIL,MOTRIN) 600 MG tablet Take 600 mg by mouth every 6 (six) hours as needed (pain).    . Linaclotide (LINZESS) 145 MCG CAPS capsule Take 1 capsule (145 mcg total) by mouth daily. 90 capsule 3  . Magnesium 400 MG TABS Take by mouth 2 (two) times daily.    Marland Kitchen omeprazole (PRILOSEC) 20 MG capsule Take 20 mg by mouth every evening.  6  . OVER THE COUNTER MEDICATION Take 1 capsule by mouth 3 (three) times daily. Hydro eye    . PARoxetine (PAXIL) 40 MG tablet Take 40 mg by mouth daily.     . vitamin B-12 (CYANOCOBALAMIN) 250 MCG tablet Take 250 mcg by mouth daily.    . [DISCONTINUED] tolterodine (DETROL LA) 4 MG 24 hr capsule Take 4 mg by mouth daily. Needs name brand only--generic does not work for patient.     No current facility-administered medications for this visit.    PAST MEDICAL HISTORY: Past Medical History  Diagnosis Date  . GERD (gastroesophageal reflux disease)   . Vertigo   . Anxiety   . Hiatal hernia   . Depression   . Skin cancer   . Fibroid   . Osteoarthritis   . Fibromyalgia   . Spinal stenosis     lumbar  . Low back pain   . Tremors of nervous system   . Leg cramps   . Heat exhaustion   . History of cardiovascular stress test     Lexiscan Myoview 7/16:  EF 65%, inferolateral defect (prob artifactual), no ischemia  . Herniated disc, cervical     C3  . Lumbar  herniated disc     L4,L5  . Essential tremor 2016    legs, arm,lower jaw  . Rheumatoid arthritis, adult ([place]) 2016    both feet  . Barrett's esophagus     in the past  . Colon polyp   . IBS (irritable bowel syndrome)     constipation  . Female bladder prolapse   . Osteopenia     PAST SURGICAL HISTORY: Past Surgical History  Procedure Laterality Date  . Rotator cuff repair Right 2011  . Cervical laminectomy  2002    C5/C6  . Cholecystectomy  2008  . Abdominal hysterectomy  2003    [facility] still has ovaries--[name]  . Fractured ankle Left 10/2014    FAMILY HISTORY: Family History  Problem Relation Age of Onset  . Coronary artery disease Father 60    CABG 2002, followd by Dr, Ron Parker  . Prostate cancer Father   . Bladder Cancer Father   . Skin cancer Father   . Diabetes Father 58    DM type 2  . Atrial fibrillation Mother   . Multiple myeloma Mother   . Skin cancer Mother   . Coronary artery disease Mother   . Diabetes Maternal Grandmother   . Hypertension Maternal Grandmother   . Diabetes Maternal Grandfather   . Hypertension Maternal Grandfather   . Osteoarthritis Paternal Grandmother   . Colon cancer Neg Hx   . Irritable bowel syndrome Mother     SOCIAL HISTORY:  Social History   Social History  . Marital Status: Married    Spouse Name: [name]  . Number of Children: 0  . Years of Education: 12   Occupational History  . Retired    Social History Main Topics  . Smoking status: Never Smoker   . Smokeless tobacco: Never Used  . Alcohol Use: No  . Drug Use: No  . Sexual Activity:    Partners: Male    Birth Control/ Protection: Surgical     Comment: [facility] still has ovaries   Other Topics Concern  . Not on file   Social History Narrative   Lives at home with her husband.   Right-handed.   Rarely uses caffeine.     PHYSICAL EXAM   Filed Vitals:  05/25/15 0851  BP: 105/65  Pulse: 68  Resp: 20  Height: 5' 10.5" (1.791 m)  Weight: 184 lb (83.462  kg)    Not recorded      Body mass index is 26.02 kg/(m^2).  PHYSICAL EXAMNIATION:  Gen: NAD, conversant, well nourised, obese, well groomed                     Cardiovascular: Regular rate rhythm, no peripheral edema, warm, nontender. Eyes: Conjunctivae clear without exudates or hemorrhage Neck: Supple, no carotid bruise. Pulmonary: Clear to auscultation bilaterally   NEUROLOGICAL EXAM:  MENTAL STATUS: Speech:    Speech is normal; fluent and spontaneous with normal comprehension.  Cognition:    The patient is oriented to person, place, and time;     recent and remote memory intact;     language fluent;     normal attention, concentration,     fund of knowledge.  CRANIAL NERVES: CN II: Visual fields are full to confrontation. Fundoscopic exam is normal with sharp discs and no vascular changes. Pupil were equal round reactive to light  CN III, IV, VI: extraocular movement are normal. No ptosis. CN V: Facial sensation is intact to pinprick in all 3 divisions bilaterally. Corneal responses are intact.  CN VII: Face is symmetric with normal eye closure and smile. CN VIII: Hearing is normal to rubbing fingers CN IX, X: Palate elevates symmetrically. Phonation is normal. CN XI: Head turning and shoulder shrug are intact CN XII: Tongue is midline with normal movements and no atrophy.  MOTOR:.Bilateral hands posturing tremor, no rigidity. Muscle bulk and tone are normal. Muscle strength is normal.  REFLEXES: Reflexes are  3  and symmetric at the biceps, triceps, knees, and ankles. Plantar responses are flexor.  SENSORY: Light touch, pinprick, position sense, and vibration sense are intact in fingers and toes.  COORDINATION: Rapid alternating movements and fine finger movements are intact. There is no dysmetria on finger-to-nose and heel-knee-shin. There are no abnormal or extraneous movements.   GAIT/STANCE: Posture is normal. Gait is steady with normal steps, base, arm  swing, and turning. Heel and toe walking are normal. Tandem gait is normal.  Romberg is absent.   DIAGNOSTIC DATA (LABS, IMAGING, TESTING) - I reviewed patient records, labs, notes, testing and imaging myself where available.  Lab Results  Component Value Date   WBC 4.1 10/14/2014   HGB 12.8 10/14/2014   HCT 37.6 10/14/2014   MCV 95.9 10/14/2014   PLT 182 10/14/2014      Component Value Date/Time   NA 143 10/14/2014 1345   K 4.1 10/14/2014 1345   CL 104 10/14/2014 1345   CO2 31 10/14/2014 1345   GLUCOSE 115* 10/14/2014 1345   BUN 8 10/14/2014 1345   CREATININE 0.70 10/14/2014 1345   CREATININE 0.64 12/19/2013 1040   CALCIUM 9.0 10/14/2014 1345   PROT 6.8 12/19/2013 1040   ALBUMIN 4.5 12/19/2013 1040   AST 31 12/19/2013 1040   ALT 32 12/19/2013 1040   ALKPHOS 36* 12/19/2013 1040   BILITOT 0.7 12/19/2013 1040   GFRNONAA >60 10/14/2014 1345   GFRAA >60 10/14/2014 1345   Lab Results  Component Value Date   CHOL 220* 12/19/2013   HDL 51 12/19/2013   LDLCALC 142* 12/19/2013   TRIG 135 12/19/2013   CHOLHDL 4.3 12/19/2013   ASSESSMENT AND PLAN  Angela Ray is a 65 y.o. 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 Ph.D.  [facility] [address] [place], [place] [address] Ph: [phone] Fax: [phone]

## 2015-08-06 ENCOUNTER — Other Ambulatory Visit: Payer: Self-pay | Admitting: *Deleted

## 2015-08-06 ENCOUNTER — Encounter: Payer: Self-pay | Admitting: *Deleted

## 2015-08-06 ENCOUNTER — Other Ambulatory Visit: Payer: Self-pay | Admitting: Neurology

## 2015-08-06 MED ORDER — GABAPENTIN 300 MG PO CAPS: 300.0000 mg | ORAL_CAPSULE | Freq: Three times a day (TID) | ORAL | Status: DC

## 2015-08-14 ENCOUNTER — Other Ambulatory Visit: Payer: Self-pay | Admitting: *Deleted

## 2015-08-14 MED ORDER — PROPRANOLOL HCL 40 MG PO TABS: 40.0000 mg | ORAL_TABLET | Freq: Two times a day (BID) | ORAL | Status: DC

## 2015-12-13 ENCOUNTER — Encounter: Payer: Self-pay | Admitting: Obstetrics and Gynecology

## 2015-12-30 ENCOUNTER — Other Ambulatory Visit: Payer: Self-pay | Admitting: Neurology

## 2016-01-09 ENCOUNTER — Other Ambulatory Visit: Payer: Self-pay | Admitting: Neurology

## 2016-02-22 ENCOUNTER — Encounter: Payer: Self-pay | Admitting: Obstetrics and Gynecology

## 2016-02-22 ENCOUNTER — Ambulatory Visit (INDEPENDENT_AMBULATORY_CARE_PROVIDER_SITE_OTHER): Payer: 59 | Admitting: Obstetrics and Gynecology

## 2016-02-22 VITALS — BP 108/66 | HR 60 | Ht 70.25 in | Wt 188.0 lb

## 2016-02-22 DIAGNOSIS — Z119 Encounter for screening for infectious and parasitic diseases, unspecified: Secondary | ICD-10-CM | POA: Diagnosis not present

## 2016-02-22 DIAGNOSIS — R1900 Intra-abdominal and pelvic swelling, mass and lump, unspecified site: Secondary | ICD-10-CM

## 2016-02-22 DIAGNOSIS — Z01411 Encounter for gynecological examination (general) (routine) with abnormal findings: Secondary | ICD-10-CM | POA: Diagnosis not present

## 2016-02-22 DIAGNOSIS — Z0000 Encounter for general adult medical examination without abnormal findings: Secondary | ICD-10-CM | POA: Diagnosis not present

## 2016-02-22 LAB — POCT URINALYSIS DIPSTICK
Bilirubin, UA: NEGATIVE
Blood, UA: NEGATIVE
GLUCOSE UA: NEGATIVE
KETONES UA: NEGATIVE
LEUKOCYTES UA: NEGATIVE
Nitrite, UA: NEGATIVE
Protein, UA: NEGATIVE
Urobilinogen, UA: NEGATIVE
pH, UA: 5

## 2016-02-22 LAB — COMPREHENSIVE METABOLIC PANEL
ALT: 24 U/L (ref 6–29)
AST: 28 U/L (ref 10–35)
Albumin: 4.4 g/dL (ref 3.6–5.1)
Alkaline Phosphatase: 39 U/L (ref 33–130)
BILIRUBIN TOTAL: 0.7 mg/dL (ref 0.2–1.2)
BUN: 12 mg/dL (ref 7–25)
CHLORIDE: 103 mmol/L (ref 98–110)
CO2: 29 mmol/L (ref 20–31)
CREATININE: 0.75 mg/dL (ref 0.50–0.99)
Calcium: 9.1 mg/dL (ref 8.6–10.4)
Glucose, Bld: 86 mg/dL (ref 65–99)
Potassium: 4.5 mmol/L (ref 3.5–5.3)
SODIUM: 143 mmol/L (ref 135–146)
Total Protein: 6.9 g/dL (ref 6.1–8.1)

## 2016-02-22 LAB — CBC
HCT: 41.3 % (ref 35.0–45.0)
Hemoglobin: 13.7 g/dL (ref 11.7–15.5)
MCH: 31.2 pg (ref 27.0–33.0)
MCHC: 33.2 g/dL (ref 32.0–36.0)
MCV: 94.1 fL (ref 80.0–100.0)
MPV: 9.4 fL (ref 7.5–12.5)
PLATELETS: 231 10*3/uL (ref 140–400)
RBC: 4.39 MIL/uL (ref 3.80–5.10)
RDW: 13.1 % (ref 11.0–15.0)
WBC: 4.9 10*3/uL (ref 3.8–10.8)

## 2016-02-22 LAB — LIPID PANEL
CHOL/HDL RATIO: 4.5 ratio (ref <=5.0)
CHOLESTEROL: 243 mg/dL — AB (ref 125–200)
HDL: 54 mg/dL (ref >=46)
LDL Cholesterol: 163 mg/dL — ABNORMAL HIGH (ref <130)
Triglycerides: 129 mg/dL (ref <150)
VLDL: 26 mg/dL (ref <30)

## 2016-02-22 LAB — TSH: TSH: 3.19 m[IU]/L

## 2016-02-22 NOTE — Progress Notes (Signed)
Patient ID: Angela Ray, female   DOB: [birth year], 65 y.o.   MRN: [id number]  65 y.o. Angela Ray Married Caucasian female here for annual exam.    Using vaginal estrogen cream.  Already has refills from Dr. Amalia Ray.  Taking Trimethoprim for UTI prophylaxis due to recurrent UTIs.  Had a normal cystoscopy and imaging of kidneys. Patient was very worried about this due to her mother dying from sepsis following rupture of a urinary stone.  Has 4 herniated discs - C3, L3-5. Doing stretching exercises and icing. Difficult to be active.  Gaining weight.  Has hyperreflexia leg muscle and leg weakness.  Has a heated pool at [facility].  Dog died two weeks ago.  Wants to do her labs here today.   ROS: Urinary frequency.  No dysuria.  Constipation and diarrhea.  Off Linzess now. Using stool softeners which helps.  Muscle weakness, muscle/joint pain, swelling. Essential tremor.  Skin "bumps".  Seeing dermatology.  Sinus HA. Heat/cold intolerance.  PCP:   Angela Austin, MD  Patient's last menstrual period was 02/09/2002 (approximate).           Sexually active: Yes.    The current method of family planning is status post hysterectomy.    Exercising: No.  The patient does not participate in regular exercise at present. Smoker:  no  Health Maintenance: Pap: 11/20/10, Negative History of abnormal Pap:  no MMG: 11/30/15, Density Cat.C/Neg/BiRads1: [facility] Colonoscopy:  04/04/15, Tubular Adenoma, repeat in 3 years BMD: 11/28/14   Result  Osteopenia spine & Rt. HQI:ONGEX.  Will not be followed by rheumatology.  Will be followed by Dr. Charlestine Ray.  TDaP:  January 2016.  Had shingles and pneumonia vaccines as well.  Gardasil:   [name] HIV:  Today. Hep C:  Today. Screening Labs:    Urine today: neg.   reports that she has never smoked. She has never used smokeless tobacco. She reports that she does not drink alcohol or use drugs.  Past Medical History:  Diagnosis Date  . Anxiety   . Barrett's  esophagus    in the past  . Colon polyp   . Depression   . Essential tremor 2016   legs, arm,lower jaw  . Female bladder prolapse   . Fibroid   . Fibromyalgia   . GERD (gastroesophageal reflux disease)   . Heat exhaustion   . Herniated disc, cervical    C3  . Hiatal hernia   . History of cardiovascular stress test    Lexiscan Myoview 7/16:  EF 65%, inferolateral defect (prob artifactual), no ischemia  . History of recurrent UTIs   . IBS (irritable bowel syndrome)    constipation  . Leg cramps   . Low back pain   . Lumbar herniated disc    L4,L5  . Osteoarthritis   . Osteopenia   . Rheumatoid arthritis, adult ([place]) 2016   both feet  . Skin cancer   . Spinal stenosis    lumbar  . Tremors of nervous system   . Vertigo     Past Surgical History:  Procedure Laterality Date  . ABDOMINAL HYSTERECTOMY  2003   TAH still has ovaries--Dr. Quincy Ray  . CERVICAL LAMINECTOMY  2002   C5/C6  . CHOLECYSTECTOMY  2008  . fractured ankle Left 10/2014  . ROTATOR CUFF REPAIR Right 2011    Current Outpatient Prescriptions  Medication Sig Dispense Refill  . busPIRone (BUSPAR) 15 MG tablet Take 15 mg by mouth 2 (two) times daily.     Marland Kitchen  cetirizine (ZYRTEC) 10 MG tablet Take 10 mg by mouth daily as needed for allergies.     . clonazePAM (KLONOPIN) 0.5 MG tablet Take 0.5 mg by mouth 3 (three) times daily.     . diclofenac sodium (VOLTAREN) 1 % GEL Apply 2 g topically as needed (artritisis).    [name] VAGINAL 0.1 MG/GM vaginal cream Apply 1 application topically daily. Apply small amount externally to vaginal area daily    . fluticasone (FLONASE) 50 MCG/ACT nasal spray Place 2 sprays into both nostrils at bedtime as needed for allergies.   4  . gabapentin (NEURONTIN) 300 MG capsule Take 1 capsule (300 mg total) by mouth 3 (three) times daily. 270 capsule 3  . ibuprofen (ADVIL,MOTRIN) 600 MG tablet Take 600 mg by mouth every 6 (six) hours as needed (pain).    Marland Kitchen omeprazole (PRILOSEC) 20 MG  capsule Take 1 capsule 30 min prior to breakfast. 90 capsule 0  . OVER THE COUNTER MEDICATION Take 1 capsule by mouth 3 (three) times daily. Hydro eye    . PARoxetine (PAXIL) 40 MG tablet Take 40 mg by mouth daily.     . propranolol (INDERAL) 40 MG tablet Take 0.5 tablets by mouth 2 (two) times daily.    Marland Kitchen trimethoprim (TRIMPEX) 100 MG tablet Take 1 tablet by mouth daily.    . vitamin B-12 (CYANOCOBALAMIN) 250 MCG tablet Take 250 mcg by mouth daily.    Marland Kitchen lidocaine-hydrocortisone (ANAMANTEL HC) 3-0.5 % CREA Place 1 Applicatorful rectally 2 (two) times daily. (Patient not taking: Reported on 02/22/2016) 85 g 0  . Linaclotide (LINZESS) 145 MCG CAPS capsule Take 1 capsule (145 mcg total) by mouth daily. (Patient not taking: Reported on 02/22/2016) 90 capsule 3  . PROCTOSOL HC 2.5 % rectal cream PLACE 1 APPLICATION RECTALLY 2 (TWO) TIMES DAILY. (Patient not taking: Reported on 02/22/2016) 30 g 0   No current facility-administered medications for this visit.     Family History  Problem Relation Age of Onset  . Coronary artery disease Father 26    CABG 2002, followd by Dr, Ron Parker  . Prostate cancer Father   . Bladder Cancer Father   . Skin cancer Father   . Diabetes Father 105    DM type 2  . Atrial fibrillation Mother   . Multiple myeloma Mother   . Skin cancer Mother   . Coronary artery disease Mother   . Irritable bowel syndrome Mother   . Cancer Mother     multiple myeloma  . Diabetes Maternal Grandmother   . Hypertension Maternal Grandmother   . Diabetes Maternal Grandfather   . Hypertension Maternal Grandfather   . Osteoarthritis Paternal Grandmother   . Cancer Maternal Aunt     bone cancer  . Colon cancer Neg Hx     ROS:  Pertinent items are noted in HPI.  Otherwise, a comprehensive ROS was negative.  Exam:   BP 108/66 (BP Location: Right Arm, Patient Position: Sitting, Cuff Size: Large)   Pulse 60   Ht 5' 10.25" (1.784 m)   Wt 188 lb (85.3 kg)   LMP 02/09/2002  (Approximate)   BMI 26.78 kg/m     General appearance: alert, cooperative and appears stated age Head: Normocephalic, without obvious abnormality, atraumatic Neck: no adenopathy, supple, symmetrical, trachea midline and thyroid normal to inspection and palpation Lungs: clear to auscultation bilaterally Breasts: normal appearance, no masses or tenderness, No nipple retraction or dimpling, No nipple discharge or bleeding, No axillary or supraclavicular adenopathy  Heart: regular rate and rhythm Abdomen: soft, non-tender; no masses, no organomegaly Extremities: extremities normal, atraumatic, no cyanosis or edema Skin: Skin color, texture, turgor normal. No rashes or lesions Lymph nodes: Cervical, supraclavicular, and axillary nodes normal. No abnormal inguinal nodes palpated Neurologic: Grossly normal  Pelvic: External genitalia:  no lesions              Urethra:  normal appearing urethra with no masses, tenderness or lesions              Bartholins and Skenes: normal                 Vagina: normal appearing vagina with normal color and discharge, no lesions              Cervix:absent              Pap taken: No. Bimanual Exam:  Uterus:   absent              Adnexa: mass at vagina apex.  Limits not well felt.  ?Bowel  ?Ovary.              Rectal exam: Yes.  .  Confirms.              Anus:  normal sphincter tone, no lesions  Chaperone was present for exam.  Assessment:   Well woman visit with abnormal finding. Status post TAH.  Ovaries remain.  Pelvic mass.  May be ovary or loop of bowel.  Multiple general medical issues.  Recurrent UTIs. Controlled on Trimethoprim.  Osteopenia.   Plan: Yearly mammogram recommended after age 52.  Recommended self breast exam.  Pap and HR HPV as above. Discussed Calcium, Vitamin D, regular exercise program including cardiovascular and weight bearing exercise. Routine labs including HIV and hep C. Return for pelvic US. BMD through Dr.  Charlestine Ray.  Follow up annually and prn.       After visit summary provided.

## 2016-02-22 NOTE — Patient Instructions (Signed)

## 2016-02-23 LAB — VITAMIN D 25 HYDROXY (VIT D DEFICIENCY, FRACTURES): VIT D 25 HYDROXY: 27 ng/mL — AB (ref 30–100)

## 2016-02-23 LAB — HIV ANTIBODY (ROUTINE TESTING W REFLEX): HIV 1&2 Ab, 4th Generation: NONREACTIVE

## 2016-02-23 LAB — HEPATITIS C ANTIBODY: HCV Ab: NEGATIVE

## 2016-02-25 ENCOUNTER — Encounter: Payer: Self-pay | Admitting: Obstetrics and Gynecology

## 2016-02-28 ENCOUNTER — Ambulatory Visit (INDEPENDENT_AMBULATORY_CARE_PROVIDER_SITE_OTHER): Payer: 59 | Admitting: Obstetrics and Gynecology

## 2016-02-28 ENCOUNTER — Encounter: Payer: Self-pay | Admitting: Obstetrics and Gynecology

## 2016-02-28 ENCOUNTER — Ambulatory Visit (INDEPENDENT_AMBULATORY_CARE_PROVIDER_SITE_OTHER): Payer: 59

## 2016-02-28 VITALS — BP 104/74 | HR 56 | Ht 70.25 in | Wt 189.2 lb

## 2016-02-28 DIAGNOSIS — Z01411 Encounter for gynecological examination (general) (routine) with abnormal findings: Secondary | ICD-10-CM

## 2016-02-28 DIAGNOSIS — R1900 Intra-abdominal and pelvic swelling, mass and lump, unspecified site: Secondary | ICD-10-CM

## 2016-02-28 NOTE — Progress Notes (Signed)
Encounter reviewed by Dr. Aundria Rud.  Images and report reviewed with patient.  Uterus absent.  Normal ovaries.  No free fluid.

## 2016-02-28 NOTE — Progress Notes (Signed)
Patient ID: Angela Ray, female   DOB: 1951/05/07, 65 y.o.   MRN: [id number] GYNECOLOGY  VISIT   HPI: 65 y.o.   Married  Caucasian  female   [id number] with Patient's last menstrual period was 02/09/2002 (approximate).   here for pelvic ultrasound for possible pelvic mass noted on routine pelvic exam.  GYNECOLOGIC HISTORY: Patient's last menstrual period was 02/09/2002 (approximate). Contraception:  Hysterectomy Menopausal hormone therapy:  Estrace vaginal cream Last mammogram:  11-30-15 Density C/Neg/BiRads1:[facility] Last pap smear: 11-20-10 negative         OB History    Gravida Para Term Preterm AB Living   0 0 0 0 0 0   SAB TAB Ectopic Multiple Live Births   0 0 0 0 0         Patient Active Problem List   Diagnosis Date Noted  . Paresthesia 05/25/2015  . Tremor 05/25/2015  . Functional constipation 12/21/2013  . Mixed incontinence 12/16/2012  . Chest pain 06/24/2011  . ANXIETY 07/11/2008  . GERD 07/05/2008  . GASTRITIS 07/05/2008  . HIATAL HERNIA 07/05/2008  . CONSTIPATION 07/05/2008  . GALLSTONES 07/05/2008    Past Medical History:  Diagnosis Date  . Anxiety   . Barrett's esophagus    in the past  . Colon polyp   . Depression   . Essential tremor 2016   legs, arm,lower jaw  . Female bladder prolapse   . Fibroid   . Fibromyalgia   . GERD (gastroesophageal reflux disease)   . Heat exhaustion   . Herniated disc, cervical    C3  . Hiatal hernia   . History of cardiovascular stress test    Lexiscan Myoview 7/16:  EF 65%, inferolateral defect (prob artifactual), no ischemia  . History of recurrent UTIs   . Hyperlipidemia   . IBS (irritable bowel syndrome)    constipation  . Leg cramps   . Low back pain   . Lumbar herniated disc    L4,L5  . Osteoarthritis   . Osteopenia   . Rheumatoid arthritis, adult ([place]) 2016   both feet  . Skin cancer   . Spinal stenosis    lumbar  . Tremors of nervous system   . Vertigo     Past Surgical History:  Procedure  Laterality Date  . ABDOMINAL HYSTERECTOMY  2003   TAH still has ovaries--[name]  . CERVICAL LAMINECTOMY  2002   C5/C6  . CHOLECYSTECTOMY  2008  . fractured ankle Left 10/2014  . ROTATOR CUFF REPAIR Right 2011    Current Outpatient Prescriptions  Medication Sig Dispense Refill  . busPIRone (BUSPAR) 15 MG tablet Take 15 mg by mouth 2 (two) times daily.     . cetirizine (ZYRTEC) 10 MG tablet Take 10 mg by mouth daily as needed for allergies.     . clonazePAM (KLONOPIN) 0.5 MG tablet Take 0.5 mg by mouth 3 (three) times daily.     . diclofenac sodium (VOLTAREN) 1 % GEL Apply 2 g topically as needed (artritisis).    [name] VAGINAL 0.1 MG/GM vaginal cream Apply 1 application topically daily. Apply small amount externally to vaginal area daily    . fluticasone (FLONASE) 50 MCG/ACT nasal spray Place 2 sprays into both nostrils at bedtime as needed for allergies.   4  . gabapentin (NEURONTIN) 300 MG capsule Take 1 capsule (300 mg total) by mouth 3 (three) times daily. 270 capsule 3  . ibuprofen (ADVIL,MOTRIN) 600 MG tablet Take 600 mg by mouth  every 6 (six) hours as needed (pain).    Marland Kitchen lidocaine-hydrocortisone (ANAMANTEL HC) 3-0.5 % CREA Place 1 Applicatorful rectally 2 (two) times daily. 85 g 0  . Linaclotide (LINZESS) 145 MCG CAPS capsule Take 1 capsule (145 mcg total) by mouth daily. 90 capsule 3  . omeprazole (PRILOSEC) 20 MG capsule Take 1 capsule 30 min prior to breakfast. 90 capsule 0  . OVER THE COUNTER MEDICATION Take 1 capsule by mouth 3 (three) times daily. Hydro eye    . PARoxetine (PAXIL) 40 MG tablet Take 40 mg by mouth daily.     Marland Kitchen PROCTOSOL HC 2.5 % rectal cream PLACE 1 APPLICATION RECTALLY 2 (TWO) TIMES DAILY. 30 g 0  . propranolol (INDERAL) 40 MG tablet Take 0.5 tablets by mouth 2 (two) times daily.    Marland Kitchen trimethoprim (TRIMPEX) 100 MG tablet Take 1 tablet by mouth daily.    . vitamin B-12 (CYANOCOBALAMIN) 250 MCG tablet Take 250 mcg by mouth daily.     No current  facility-administered medications for this visit.      ALLERGIES: Aciphex [rabeprazole sodium]; Bupropion hcl; Cephalexin; Codeine; Codeine phosphate; Esomeprazole magnesium; Hydrocodone-acetaminophen; Oxycodone-acetaminophen; Sertraline hcl; and Doxycycline hyclate  Family History  Problem Relation Age of Onset  . Coronary artery disease Father 80    CABG 2002, followd by Dr, Ron Parker  . Prostate cancer Father   . Bladder Cancer Father   . Skin cancer Father   . Diabetes Father 3    DM type 2  . Atrial fibrillation Mother   . Multiple myeloma Mother   . Skin cancer Mother   . Coronary artery disease Mother   . Irritable bowel syndrome Mother   . Cancer Mother     multiple myeloma  . Diabetes Maternal Grandmother   . Hypertension Maternal Grandmother   . Diabetes Maternal Grandfather   . Hypertension Maternal Grandfather   . Osteoarthritis Paternal Grandmother   . Cancer Maternal Aunt     bone cancer  . Colon cancer Neg Hx     Social History   Social History  . Marital status: Married    Spouse name: [name]  . Number of children: 0  . Years of education: 12   Occupational History  . Retired Retired   Social History Main Topics  . Smoking status: Never Smoker  . Smokeless tobacco: Never Used  . Alcohol use No  . Drug use: No  . Sexual activity: Yes    Partners: Male    Birth control/ protection: Surgical     Comment: TAH still has ovaries   Other Topics Concern  . Not on file   Social History Narrative   Lives at home with her husband.   Right-handed.   Rarely uses caffeine.    ROS:  Pertinent items are noted in HPI.  PHYSICAL EXAMINATION:    BP 104/74 (BP Location: Right Arm, Patient Position: Sitting, Cuff Size: Normal)   Pulse (!) 56   Ht 5' 10.25" (1.784 m)   Wt 189 lb 3.2 oz (85.8 kg)   LMP 02/09/2002 (Approximate)   BMI 26.95 kg/m     General appearance: alert, cooperative and appears stated age   ASSESSMENT  Status post hysterectomy.   Normal pelvic ultrasound.  PLAN  Reassurance regarding the appearance of ovaries on pelvic ultrasound.  Follow up for routine exam and prn.    An After Visit Summary was printed and given to the patient.  _15_____ minutes face to face time of which over  50% was spent in counseling.

## 2016-03-03 ENCOUNTER — Encounter: Payer: Self-pay | Admitting: Cardiovascular Disease

## 2016-03-03 ENCOUNTER — Ambulatory Visit (INDEPENDENT_AMBULATORY_CARE_PROVIDER_SITE_OTHER): Payer: 59 | Admitting: Cardiovascular Disease

## 2016-03-03 VITALS — BP 124/60 | HR 56 | Ht 70.5 in | Wt 188.0 lb

## 2016-03-03 DIAGNOSIS — R0789 Other chest pain: Secondary | ICD-10-CM | POA: Diagnosis not present

## 2016-03-03 DIAGNOSIS — R079 Chest pain, unspecified: Secondary | ICD-10-CM | POA: Diagnosis not present

## 2016-03-03 NOTE — Progress Notes (Signed)
 Chief Complaint  Patient presents with  . Leg Pain   History of Present Illness: Angela Ray with history of GERD, anxiety, vertigo here today for follow up. I met her in 2013 for evaluation of chest pain and palpitations. She had been followed in the past by [name] for workup of palpitations, dyspnea and atypical chest pains and event monitor had shown sinus tach and echo showed normal LV function. Exercise stress test in 2007 is reported as normal. It was felt that her symptoms at that time were mostly anxiety related. I arranged an echo in 2013 which showed normal LV systolic function, moderate diastolic dysfunction. Exercise stress test with no ischemia in 2013. She was seen in our office July 2016 by [name], PA-C for ED follow up after an episode of chest pain. She had been in the heat at a car show. In the ED, troponin negative. Nuclear stress test 12/07/14 with no ischemia.   She is here today for follow up. She denies chest pain, dyspnea, palpitations. She has issue with joint pains. She states that she was mis-diagnosed with RA three years ago and had no improvement with therapy. She is now being treated for OA.   Primary Care: GATES,DONNA RUTH, MD  Past Medical History:  Diagnosis Date  . Anxiety   . Barrett's esophagus    in the past  . Colon polyp   . Depression   . Essential tremor 2016   legs, arm,lower jaw  . Female bladder prolapse   . Fibroid   . Fibromyalgia   . GERD (gastroesophageal reflux disease)   . Heat exhaustion   . Herniated disc, cervical    C3  . Hiatal hernia   . History of cardiovascular stress test    Lexiscan Myoview 7/16:  EF 65%, inferolateral defect (prob artifactual), no ischemia  . History of recurrent UTIs   . Hyperlipidemia   . IBS (irritable bowel syndrome)    constipation  . Leg cramps   . Low back pain   . Lumbar herniated disc    L4,L5  . Osteoarthritis   . Osteopenia   . Rheumatoid arthritis, adult ([facility]) 2016   both feet  . Skin cancer   . Spinal stenosis    lumbar  . Tremors of nervous system   . Vertigo     Past Surgical History:  Procedure Laterality Date  . ABDOMINAL HYSTERECTOMY  2003   TAH still has ovaries--Dr. Silva  . CERVICAL LAMINECTOMY  2002   C5/C6  . CHOLECYSTECTOMY  2008  . fractured ankle Left 10/2014  . ROTATOR CUFF REPAIR Right 2011    Current Outpatient Prescriptions  Medication Sig Dispense Refill  . busPIRone (BUSPAR) 15 MG tablet Take 15 mg by mouth 2 (two) times daily.     . cetirizine (ZYRTEC) 10 MG tablet Take 10 mg by mouth daily as needed for allergies.     . clonazePAM (KLONOPIN) 0.5 MG tablet Take 0.5 mg by mouth 3 (three) times daily.     . diclofenac sodium (VOLTAREN) 1 % GEL Apply 2 g topically as needed (artritisis).    . ESTRACE VAGINAL 0.1 MG/GM vaginal cream Apply 1 application topically daily. Apply small amount externally to vaginal area daily    . fluticasone (FLONASE) 50 MCG/ACT nasal spray Place 2 sprays into both nostrils at bedtime as needed for allergies.   4  . gabapentin (NEURONTIN) 300 MG capsule Take 1 capsule (300 mg total) by mouth   3 (three) times daily. 270 capsule 3  . ibuprofen (ADVIL,MOTRIN) 600 MG tablet Take 600 mg by mouth every 6 (six) hours as needed (pain).    Marland Kitchen omeprazole (PRILOSEC) 20 MG capsule Take 1 capsule 30 min prior to breakfast. 90 capsule 0  . OVER THE COUNTER MEDICATION Take 1 capsule by mouth 3 (three) times daily. Hydro eye    . PARoxetine (PAXIL) 40 MG tablet Take 40 mg by mouth daily.     . propranolol (INDERAL) 40 MG tablet Take 0.5 tablets by mouth 2 (two) times daily.    Marland Kitchen trimethoprim (TRIMPEX) 100 MG tablet Take 1 tablet by mouth daily.     No current facility-administered medications for this visit.     Allergies  Allergen Reactions  . Aciphex [Rabeprazole Sodium] Other (See Comments)    REACTION: "FELT LIKE INDIGESTION"  . Bupropion Hcl Other (See Comments)    REACTION: "DIDN'T MAKE ME FEEL RIGHT IN  THE HEAD"  . Cephalexin Nausea Only and Other (See Comments)    REACTION: "GAVE YEAST INFECTION"  . Codeine Other (See Comments)    REACTION: "SEVERE NAUSEA, HEART PALPITATION, PASSING OUT, HEART ATTACK LIKE SYMPTOMS"  . Codeine Phosphate Other (See Comments)    REACTION: "SEVERE NAUSEA, HEART PALPITATION, PASSING OUT, HEART ATTACK LIKE SYMPTOMS"  . Esomeprazole Magnesium Other (See Comments)    Nexium=REACTION: " CAUSE TASTE BUDS TO FALL OUT"  . Hydrocodone-Acetaminophen Other (See Comments)    REACTION: "SEVERE NAUSEA, HEART PALPITATION, PASSING OUT, HEART ATTACK LIKE SYMPTOMS"  . Oxycodone-Acetaminophen Other (See Comments)    REACTION: "SEVERE NAUSEA, HEART PALPITATION, PASSING OUT, HEART ATTACK LIKE SYMPTOMS"  . Sertraline Hcl Other (See Comments)    REACTION: "DIDN'T MAKE ME FEEL RIGHT IN THE HEAD"  . Doxycycline Hyclate Nausea Only    Social History   Social History  . Marital status: Married    Spouse name: [name]  . Number of children: 0  . Years of education: 12   Occupational History  . Retired Retired   Social History Main Topics  . Smoking status: Never Smoker  . Smokeless tobacco: Never Used  . Alcohol use No  . Drug use: No  . Sexual activity: Yes    Partners: Male    Birth control/ protection: Surgical     Comment: TAH still has ovaries   Other Topics Concern  . Not on file   Social History Narrative   Lives at home with her husband.   Right-handed.   Rarely uses caffeine.    Family History  Problem Relation Age of Onset  . Coronary artery disease Father 60    CABG 2002, followd by Dr, Ron Parker  . Prostate cancer Father   . Bladder Cancer Father   . Skin cancer Father   . Diabetes Father 33    DM type 2  . Atrial fibrillation Mother   . Multiple myeloma Mother   . Skin cancer Mother   . Coronary artery disease Mother   . Irritable bowel syndrome Mother   . Cancer Mother     multiple myeloma  . Diabetes Maternal Grandmother   . Hypertension  Maternal Grandmother   . Diabetes Maternal Grandfather   . Hypertension Maternal Grandfather   . Osteoarthritis Paternal Grandmother   . Cancer Maternal Aunt     bone cancer  . Colon cancer Neg Hx     Review of Systems:  As stated in the HPI and otherwise negative.   BP 124/60   Pulse Marland Kitchen)  56   Ht 5' 10.5" (1.791 m)   Wt 188 lb (85.3 kg)   LMP 02/09/2002 (Approximate)   BMI 26.59 kg/m   Physical Examination: General: Well developed, well nourished, NAD  HEENT: OP clear, mucus membranes moist  SKIN: warm, dry. No rashes. Neuro: No focal deficits  Musculoskeletal: Muscle strength 5/5 all ext  Psychiatric: Mood and affect normal  Neck: No JVD, no carotid bruits, no thyromegaly, no lymphadenopathy.  Lungs:Clear bilaterally, no wheezes, rhonci, crackles Cardiovascular: Regular rate and rhythm. No murmurs, gallops or rubs. Abdomen:Soft. Bowel sounds present. Non-tender.  Extremities: No lower extremity edema. Pulses are 2 + in the bilateral DP/PT.  EKG:  EKG is ordered today. The ekg ordered today demonstrates sinus brady, rate 56 bpm.   Recent Labs: 02/22/2016: ALT 24; BUN 12; Creat 0.75; Hemoglobin 13.7; Platelets 231; Potassium 4.5; Sodium 143; TSH 3.19   Lipid Panel    Component Value Date/Time   CHOL 243 (H) 02/22/2016 1347   TRIG 129 02/22/2016 1347   HDL 54 02/22/2016 1347   CHOLHDL 4.5 02/22/2016 1347   VLDL 26 02/22/2016 1347   LDLCALC 163 (H) 02/22/2016 1347     Wt Readings from Last 3 Encounters:  03/03/16 188 lb (85.3 kg)  02/28/16 189 lb 3.2 oz (85.8 kg)  02/22/16 188 lb (85.3 kg)     Other studies Reviewed: Additional studies/ records that were reviewed today include: . Review of the above records demonstrates:   Assessment and Plan:  1. Chest pain: No recurrence. Normal stress test December 2016. EKG today is normal. No further cardiac workup  Current medicines are reviewed at length with the patient today.  The patient does not have concerns  regarding medicines.  The following changes have been made:  no change  Labs/ tests ordered today include:  No orders of the defined types were placed in this encounter.  Disposition:   FU with me as needed.   Signed,  , MD 03/03/2016 4:44 PM    [facility] [address], [place], [place]  [address] Phone: [phone]; Fax: [phone]    

## 2016-03-03 NOTE — Patient Instructions (Signed)
Medication Instructions:  Your physician recommends that you continue on your current medications as directed. Please refer to the Current Medication list given to you today.   Labwork: none  Testing/Procedures: none  Follow-Up: Your physician recommends that you schedule a follow-up appointment as needed.    Any Other Special Instructions Will Be Listed Below (If Applicable).     If you need a refill on your cardiac medications before your next appointment, please call your pharmacy.   

## 2016-09-30 ENCOUNTER — Other Ambulatory Visit: Payer: Self-pay | Admitting: Gastroenterology

## 2016-10-10 DIAGNOSIS — M720 Palmar fascial fibromatosis [Dupuytren]: Secondary | ICD-10-CM

## 2016-10-10 HISTORY — DX: Palmar fascial fibromatosis (dupuytren): M72.0

## 2016-11-09 NOTE — Progress Notes (Signed)
[place] [facility] [address] [place], [place] [address] [phone]  Date:  11/10/2016   Name:  Angela Ray   DOB:  04/15/1951   MRN:  [id number]  PCP:  Darcus Austin, MD    Chief Complaint: Establish Care (Pt here to est care. hx herniated disk, c/o burning shooting pain in shoulder. )   History of Present Illness:  Angela Ray is a 66 y.o. very pleasant female patient who presents with the following:  Here today as a new patient to establish care.  She has GYN ([name]), GI (Dr. Stevphen Rochester), neurology (Dr. Krista Blue) and urology (Dr. Alona Bene with [phone]) care already  She is changing her primary care as she recently moved to [facility] which is close to our location She loves living at [place]- she and her husband moved there from [place].  They did a lot of gardening and outdoor work, but as her health became not as good they downsized.  She still does some container gardening.  The move is an adjustment but overall is a good thing  Her dog passed away a few months ago, and her elderly mother passed away a few years ago. She and her husband went away to the beach just the two of them this summer which they had not been able to do in years.    She notes TMJ for years, GERD with hiatal hernia She has had cervical spine surgery in the past, and has herniated discs in her lumbar spine as well She was told that she had "full blown" RA in the past- however she got a 2nd opinion from [facility] and was found to have mild RA and also OA- she is not on any immunomodulator right now/  However since [facility] has retired she needs to seek a new rheumatologist.  She has noted nodules along the flexor tendon of the long finger- in the palm of her hand- on her bilateral hands for a few months.  She also has essential tremor- inderal does help her She has "osteopenia in my hips" per her report  She had hysterectomy due to  fibroids.  She still has her ovaries   She uses gabapentin to help prevent cramps in her bilateral legs- mostly has her cramping at night  She does use omeprazole for her GERD.  Also has history of hiatal hernia.  Notes that she has felt "a gas bubble" in her stomach for the last several months and wonders what she should do  She takes klonopin, paxil ,buspar for her panic   Patient Active Problem List   Diagnosis Date Noted  . Paresthesia 05/25/2015  . Tremor 05/25/2015  . IBS (irritable bowel syndrome) 02/07/2015  . Recurrent UTI 02/07/2015  . Functional constipation 12/21/2013  . Mixed incontinence 12/16/2012  . Chest pain 06/24/2011  . ANXIETY 07/11/2008  . GERD 07/05/2008  . GASTRITIS 07/05/2008  . HIATAL HERNIA 07/05/2008  . GALLSTONES 07/05/2008    Past Medical History:  Diagnosis Date  . Anxiety   . Barrett's esophagus    in the past  . Colon polyp   . Depression   . Essential tremor 2016   legs, arm,lower jaw  . Female bladder prolapse   . Fibroid   . Fibromyalgia   . GERD (gastroesophageal reflux disease)   . Heat exhaustion   . Herniated disc, cervical    C3  . Hiatal hernia   .  History of cardiovascular stress test    Lexiscan Myoview 7/16:  EF 65%, inferolateral defect (prob artifactual), no ischemia  . History of recurrent UTIs   . Hyperlipidemia   . IBS (irritable bowel syndrome)    constipation  . Leg cramps   . Low back pain   . Lumbar herniated disc    L4,L5  . Osteoarthritis   . Osteopenia   . Rheumatoid arthritis, adult ([place]) 2016   both feet  . Skin cancer   . Spinal stenosis    lumbar  . Tremors of nervous system   . Vertigo     Past Surgical History:  Procedure Laterality Date  . ABDOMINAL HYSTERECTOMY  2003   TAH still has ovaries--[name]  . CERVICAL LAMINECTOMY  2002   C5/C6  . CHOLECYSTECTOMY  2008  . fractured ankle Left 10/2014  . ROTATOR CUFF REPAIR Right 2011    Social History  Substance Use Topics  . Smoking  status: Never Smoker  . Smokeless tobacco: Never Used  . Alcohol use No    Family History  Problem Relation Age of Onset  . Coronary artery disease Father 36       CABG 2002, followd by Dr, Ron Parker  . Prostate cancer Father   . Bladder Cancer Father   . Skin cancer Father   . Diabetes Father 81       DM type 2  . Atrial fibrillation Mother   . Multiple myeloma Mother   . Skin cancer Mother   . Coronary artery disease Mother   . Irritable bowel syndrome Mother   . Cancer Mother        multiple myeloma  . Diabetes Maternal Grandmother   . Hypertension Maternal Grandmother   . Diabetes Maternal Grandfather   . Hypertension Maternal Grandfather   . Osteoarthritis Paternal Grandmother   . Cancer Maternal Aunt        bone cancer  . Colon cancer Neg Hx     Allergies  Allergen Reactions  . Aciphex [Rabeprazole Sodium] Other (See Comments)    REACTION: "FELT LIKE INDIGESTION"  . Bupropion Hcl Other (See Comments)    REACTION: "DIDN'T MAKE ME FEEL RIGHT IN THE HEAD"  . Cephalexin Nausea Only and Other (See Comments)    REACTION: "GAVE YEAST INFECTION"  . Codeine Other (See Comments)    REACTION: "SEVERE NAUSEA, HEART PALPITATION, PASSING OUT, HEART ATTACK LIKE SYMPTOMS"  . Codeine Phosphate Other (See Comments)    REACTION: "SEVERE NAUSEA, HEART PALPITATION, PASSING OUT, HEART ATTACK LIKE SYMPTOMS"  . Esomeprazole Magnesium Other (See Comments)    Nexium=REACTION: " CAUSE TASTE BUDS TO FALL OUT"  . Hydrocodone-Acetaminophen Other (See Comments)    REACTION: "SEVERE NAUSEA, HEART PALPITATION, PASSING OUT, HEART ATTACK LIKE SYMPTOMS"  . Oxycodone-Acetaminophen Other (See Comments)    REACTION: "SEVERE NAUSEA, HEART PALPITATION, PASSING OUT, HEART ATTACK LIKE SYMPTOMS"  . Sertraline Hcl Other (See Comments)    REACTION: "DIDN'T MAKE ME FEEL RIGHT IN THE HEAD"  . Doxycycline Hyclate Nausea Only    Medication list has been reviewed and updated.  Current Outpatient Prescriptions  on File Prior to Visit  Medication Sig Dispense Refill  . busPIRone (BUSPAR) 15 MG tablet Take 15 mg by mouth 2 (two) times daily.     . cetirizine (ZYRTEC) 10 MG tablet Take 10 mg by mouth daily as needed for allergies.     . clonazePAM (KLONOPIN) 0.5 MG tablet Take 0.5 mg by mouth 3 (three) times daily.     Marland Kitchen  diclofenac sodium (VOLTAREN) 1 % GEL Apply 2 g topically as needed (artritisis).    [name] VAGINAL 0.1 MG/GM vaginal cream Apply 1 application topically daily. Apply small amount externally to vaginal area daily    . fluticasone (FLONASE) 50 MCG/ACT nasal spray Place 2 sprays into both nostrils at bedtime as needed for allergies.   4  . gabapentin (NEURONTIN) 300 MG capsule Take 1 capsule (300 mg total) by mouth 3 (three) times daily. (Patient taking differently: Take 300 mg by mouth at bedtime. ) 270 capsule 3  . ibuprofen (ADVIL,MOTRIN) 600 MG tablet Take 600 mg by mouth every 6 (six) hours as needed (pain).    Marland Kitchen omeprazole (PRILOSEC) 20 MG capsule Take 1 capsule 30 min prior to breakfast. 90 capsule 0  . OVER THE COUNTER MEDICATION Take 1 capsule by mouth 3 (three) times daily. Hydro eye    . PARoxetine (PAXIL) 40 MG tablet Take 40 mg by mouth daily.     . propranolol (INDERAL) 40 MG tablet Take 0.5 tablets by mouth 2 (two) times daily.    Marland Kitchen trimethoprim (TRIMPEX) 100 MG tablet Take 1 tablet by mouth daily.    . [DISCONTINUED] tolterodine (DETROL LA) 4 MG 24 hr capsule Take 4 mg by mouth daily. Needs name brand only--generic does not work for patient.     No current facility-administered medications on file prior to visit.     Review of Systems:   No fever or chills,  No CP or SOB, no rash, cough, ST As per HPI- otherwise negative.   Physical Examination: Vitals:   11/10/16 1334  BP: 98/76  Pulse: 70  Temp: 98.6 F (37 C)   Vitals:   11/10/16 1334  Weight: 182 lb 12.8 oz (82.9 kg)  Height: 5' 10.5" (1.791 m)   Body mass index is 25.86 kg/m. Ideal Body Weight:  Weight in (lb) to have BMI = 25: 176.4  GEN: WDWN, NAD, Non-toxic, A & O x 3, looks well, appears athletic and strong for age HEENT: Atraumatic, Normocephalic. Neck supple. No masses, No LAD. Ears and Nose: No external deformity. CV: RRR, No M/G/R. No JVD. No thrill. No extra heart sounds. PULM: CTA B, no wheezes, crackles, rhonchi. No retractions. No resp. distress. No accessory muscle use. ABD: S, NT, ND, +BS. No rebound. No HSM. EXTR: No c/c/e NEURO Normal gait.  PSYCH: Normally interactive. Conversant. Not depressed or anxious appearing.  Calm demeanor.  Bilateral hands have a soft tissue nodule over the palmar aspect, overlying the 3rd MC   Assessment and Plan: Pain in both hands - Plan: DG Hand Complete Right, DG Hand Complete Left  Rheumatoid arthritis involving multiple joints (HCC) - Plan: Ambulatory referral to Rheumatology, traMADol (ULTRAM) 50 MG tablet  Panic disorder  Essential tremor  Here today to establish care Refilled tramadol that she uses on occasion for joint pains, and referred to see Dr. Estanislado Pandy for her RA concerns  Also obtain plain films of her hands today Continue medications for panic disorder and tremor She is also on klonopin, buspar, gabapentin.  However states emphatically that these medications do not make her at all drowsy as she has been on them for a long time   Dg Hand Complete Left  Result Date: 11/10/2016 CLINICAL DATA:  Hand pain, initial encounter EXAM: LEFT HAND - COMPLETE 3+ VIEW COMPARISON:  06/08/2009 FINDINGS: No acute fracture or dislocation is noted. Mild degenerative changes are noted in the distal interphalangeal joints. No soft tissue abnormality  is noted. IMPRESSION: Mild DIP degenerative change slightly progressed from the prior exam. Electronically Signed   By: Inez Catalina M.D.   On: 11/10/2016 14:55   Dg Hand Complete Right  Result Date: 11/10/2016 CLINICAL DATA:  Bilateral hand pain since January, she has noticed soft tissue  masses popping up in both palms. EXAM: RIGHT HAND - COMPLETE 3+ VIEW COMPARISON:  None. FINDINGS: There is no evidence of fracture or dislocation. There are osteoarthritic changes with narrowed joint space and osteophyte formation of the distal interphalangeal joints. Soft tissues are unremarkable. IMPRESSION: No acute abnormality. Osteoarthritic changes of the right hand as described. Electronically Signed   By: Abelardo Diesel M.D.   On: 11/10/2016 14:41   Asked her to followup with me in 4-6 months    Signed Lamar Blinks, MD

## 2016-11-10 ENCOUNTER — Telehealth: Payer: Self-pay | Admitting: Behavioral Health

## 2016-11-10 ENCOUNTER — Ambulatory Visit (HOSPITAL_BASED_OUTPATIENT_CLINIC_OR_DEPARTMENT_OTHER)
Admission: RE | Admit: 2016-11-10 | Discharge: 2016-11-10 | Disposition: A | Discharge status: Home / Self Care | Payer: BLUE CROSS/BLUE SHIELD | Source: Ambulatory Visit | Attending: Family Medicine | Admitting: Family Medicine

## 2016-11-10 ENCOUNTER — Ambulatory Visit (INDEPENDENT_AMBULATORY_CARE_PROVIDER_SITE_OTHER): Payer: BLUE CROSS/BLUE SHIELD | Admitting: Family Medicine

## 2016-11-10 ENCOUNTER — Encounter: Payer: Self-pay | Admitting: Behavioral Health

## 2016-11-10 VITALS — BP 98/76 | HR 70 | Temp 98.6°F | Ht 70.5 in | Wt 182.8 lb

## 2016-11-10 DIAGNOSIS — F410 Panic disorder [episodic paroxysmal anxiety] without agoraphobia: Secondary | ICD-10-CM

## 2016-11-10 DIAGNOSIS — G250 Essential tremor: Secondary | ICD-10-CM

## 2016-11-10 DIAGNOSIS — M069 Rheumatoid arthritis, unspecified: Secondary | ICD-10-CM | POA: Diagnosis not present

## 2016-11-10 DIAGNOSIS — M79642 Pain in left hand: Secondary | ICD-10-CM | POA: Insufficient documentation

## 2016-11-10 DIAGNOSIS — M79641 Pain in right hand: Secondary | ICD-10-CM

## 2016-11-10 MED ORDER — TRAMADOL HCL 50 MG PO TABS: 50.0000 mg | ORAL_TABLET | Freq: Three times a day (TID) | ORAL | 1 refills | Status: DC | PRN

## 2016-11-10 NOTE — Telephone Encounter (Signed)
Unable to reach patient at time of Pre-Visit Call.  Left message for patient to return call when available.    

## 2016-11-10 NOTE — Patient Instructions (Signed)
Please go to the imaging department on the ground floor next to the elevators I refilled your tramadol today- you can use this as needed for your neck pain.  Remember that this medication can make you feel drowsy We will refer you to see Dr. Estanislado Pandy (rheumatology)

## 2016-11-10 NOTE — Telephone Encounter (Signed)
Pre-Visit Call completed with patient and chart updated.   Pre-Visit Info documented in Specialty Comments under [employer].    

## 2016-11-20 ENCOUNTER — Encounter: Payer: Self-pay | Admitting: Family Medicine

## 2016-11-20 ENCOUNTER — Telehealth: Payer: Self-pay | Admitting: Family Medicine

## 2016-11-20 NOTE — Telephone Encounter (Signed)
error:[id number] ° °

## 2016-11-20 NOTE — Telephone Encounter (Signed)
Relation to HX:TAVW Call back [phone]  Reason for call:  Patient dropped of medical records from Dr. Lindaann Slough, placed in bin located at the front desk for PCP to sign.

## 2016-11-21 NOTE — Telephone Encounter (Signed)
Received Medical records from Lindaann Slough, Rheumatology, forwarded to provider/SLS 07/13

## 2016-11-25 ENCOUNTER — Telehealth: Payer: Self-pay | Admitting: Family Medicine

## 2016-11-25 DIAGNOSIS — M797 Fibromyalgia: Secondary | ICD-10-CM | POA: Insufficient documentation

## 2016-11-25 DIAGNOSIS — M48061 Spinal stenosis, lumbar region without neurogenic claudication: Secondary | ICD-10-CM

## 2016-11-25 NOTE — Telephone Encounter (Signed)
Received records from Dr. Charlestine Night- will abstract and scan as needed

## 2016-12-04 ENCOUNTER — Ambulatory Visit (INDEPENDENT_AMBULATORY_CARE_PROVIDER_SITE_OTHER): Payer: BLUE CROSS/BLUE SHIELD | Admitting: Neurology

## 2016-12-04 ENCOUNTER — Encounter: Payer: Self-pay | Admitting: Neurology

## 2016-12-04 VITALS — BP 109/65 | HR 63 | Ht 70.5 in | Wt 184.5 lb

## 2016-12-04 DIAGNOSIS — F419 Anxiety disorder, unspecified: Secondary | ICD-10-CM | POA: Diagnosis not present

## 2016-12-04 DIAGNOSIS — G250 Essential tremor: Secondary | ICD-10-CM | POA: Diagnosis not present

## 2016-12-04 DIAGNOSIS — F5104 Psychophysiologic insomnia: Secondary | ICD-10-CM | POA: Insufficient documentation

## 2016-12-04 MED ORDER — DICLOFENAC SODIUM 1 % TD GEL: 2.0000 g | TRANSDERMAL | 11 refills | Status: DC | PRN

## 2016-12-04 MED ORDER — PROPRANOLOL HCL 20 MG PO TABS: 20.0000 mg | ORAL_TABLET | Freq: Two times a day (BID) | ORAL | 4 refills | Status: DC

## 2016-12-04 MED ORDER — GABAPENTIN 300 MG PO CAPS: 300.0000 mg | ORAL_CAPSULE | Freq: Every day | ORAL | 4 refills | Status: DC

## 2016-12-04 NOTE — Patient Instructions (Signed)
Dupuytren's contracture

## 2016-12-04 NOTE — Progress Notes (Signed)
Chief Complaint  Patient presents with  . Tremors    She is here for her yearly follow up.  Reports decreased BP with Inderal 26m, BID.  She is now taking Inderal 237m BID but is still having problems with tremors at the lower dose.  . Marland KitchenLE muscle cramps    Taking gabapentin 30058mTID which is helpful.  . Pain    She is using voltaren gel for her arthritis in her hands and also her neck pain.   Chief Complaint  Patient presents with  . Tremors    She is here for her yearly follow up.  Reports decreased BP with Inderal 57m65mID.  She is now taking Inderal 20mg58mD but is still having problems with tremors at the lower dose.  . BLEMarland Kitchenmuscle cramps    Taking gabapentin 300mg,56m which is helpful.  . Pain    She is using voltaren gel for her arthritis in her hands and also her neck pain.      PATIENT: Angela MARTINEZGARCIA8/05/1908/04/56f Complaint  Patient presents with  . Tremors    She is here for her yearly follow up.  Reports decreased BP with Inderal 57mg, 27m  She is now taking Inderal 20mg, [id number] is still having problems with tremors at the lower dose.  . BLE muMarland Kitchencle cramps    Taking gabapentin 300mg, TI49mich is helpful.  . Pain    She is using voltaren gel for her arthritis in her hands and also her neck pain.    HISTORICAL  Angela FDEEPTI GUNAWANyears 66d right-handed female, accompanied by her husband, seen in refer by her primary care physician Dr. Donna GatDarcus Austinuation of low back pain, radiating down to her legs, bilateral lower extremity cramping, bilateral hands shaking  I have reviewed referring note from Dr. Donna GatDarcus Austin5 2016,  Laboratory evaluation in Oct 04 2014, normal CBC, CMP with exception of mild elevated glucose 102, B12 1196, normal TSH 1.9  She has history of cervical decompression in May 2002, presented with right cervical radiculopathy, right-sided neck pain, radiating pain to right arm, right hand cramping, cervical  decompression surgery has really helped her symptoms, she did not have gait difficulty, leg symptoms at that time  She has been very active all her life, including heavy lifting, had mild chronic low back pain, her symptoms has much worsened since fall of 2015, she noticed worsening low back pain, radiating pain to bilateral leg, mild unsteady gait, actually tripped and fell few times, in addition, she also complains of gradual worsening bilateral foot, leg muscle cramping, sometimes difficulty falling to sleep because painful muscle cramps, spasm, lasting for hours. She also feels bilateral lower extremity weakness especially in early morning time, she has urinary urgency. Occasionally bilateral hands muscle cramping, paresthesia.   Her mother has bilateral hands tremor in her seventies64 bEtienne experience worsening hand tremor since 2015, difficulty holding a pen, using utensils, mild jaw tremor  We have personally reviewed MRI lumbar in October 16 2014: Multilevel mild generative disc disease, most severe at L5-S1, L4-5, L3 and 4, there was no significant foraminal or canal stenosis.   Over the past few months, she has tried Dilantin  Dilantin 100 mg 3 times a day in March 2016, tizanidine 4 mg 1 tablet at bedtime in August 23 2014,   For tremor, she was given primidone  since March 2016, titrating dose up  to half tablets in the morning, one tablets every night, but could not tolerate the medications, "funny in head" wean herself off it  since Sep 15 2014, She was also given Lozone  739m tid, valacyclovir for possible shingles.  UPDATE November 20 2014: She started gabapentin 3039m one tablet every night, which has been very helpful, she can sleep much better, She is also taking inderal 2072mid, which has helped her tremor, but she continue has mild hands tremor at late evening, she has TMJ, noticed jaw tremor, bilateral TMC joints pain often    We have reviewed MRI of cervical spine in July  2016: At C4-5: rightward disc bulging and uncovertebral joint hypertrophy with mild right foraminal stenosis. Anterior discectomy and fusion with metal hardware at C4-C5.  No intrinsic or compressive spinal cord lesions  She does complain frequent neck pain, radiating pain to right shoulder, right upper extremity.  MRI of lumbar showedt L5-S1. , there is a possible chronic disc extrusion adjacent to the right S1 nerve root, unchanged from 2009. This could contribute to right S1 nerve root encroachment.  Mildly progressive disc degeneration at L3-4 and L4-5. Previously demonstrated left foraminal disc protrusion at L3-4 has partially involuted.  UPDATE May 25 2015: Gabapentin has helped her right low back pain, radiating pain to her leg, she still has tremors intermittent now, reported 50% improvement with low-dose propanolol 20 mg twice a day, she was recently evaluated by Dr. ElsEllene Routead more extensive MRI studies,  I have personally reviewed MRI of thoracic spine no acute abnormality, no evidence of canal foraminal stenosis,  MRI of the brain showed mild generalized atrophy  UPDATE December 04 2016: She moved into retirement community, she still has intermittent hands, legs, jaw tremor, if she is in yard, lifting, activities will trigger her symptoms, holding heavy object, she has tried propanolol 40 mg twice a day complains of dizziness, tolerating 20 mg twice a day better  She still has intermittent low back pain, neck pain, she is now using Voltaren gel prn, which has been very helpful, also taking gabapentin 300 mg every night which has helped her sleep some, and bilateral lotion to paresthesia, she continue has chronic insomnia, despite taking clonazepam 0.5 milligrams 3 times a day  REVIEW OF SYSTEMS: Full 14 system review of systems performed and notable only for as above   ALLERGIES: Allergies  Allergen Reactions  . Aciphex [Rabeprazole Sodium] Other (See Comments)    REACTION: "FELT  LIKE INDIGESTION"  . Bupropion Hcl Other (See Comments)    REACTION: "DIDN'T MAKE ME FEEL RIGHT IN THE HEAD"  . Cephalexin Nausea Only and Other (See Comments)    REACTION: "GAVE YEAST INFECTION"  . Codeine Other (See Comments)    REACTION: "SEVERE NAUSEA, HEART PALPITATION, PASSING OUT, HEART ATTACK LIKE SYMPTOMS"  . Codeine Phosphate Other (See Comments)    REACTION: "SEVERE NAUSEA, HEART PALPITATION, PASSING OUT, HEART ATTACK LIKE SYMPTOMS"  . Esomeprazole Magnesium Other (See Comments)    Nexium=REACTION: " CAUSE TASTE BUDS TO FALL OUT"  . Hydrocodone-Acetaminophen Other (See Comments)    REACTION: "SEVERE NAUSEA, HEART PALPITATION, PASSING OUT, HEART ATTACK LIKE SYMPTOMS"  . Oxycodone-Acetaminophen Other (See Comments)    REACTION: "SEVERE NAUSEA, HEART PALPITATION, PASSING OUT, HEART ATTACK LIKE SYMPTOMS"  . Sertraline Hcl Other (See Comments)    REACTION: "DIDN'T MAKE ME FEEL RIGHT IN THE HEAD"  . Doxycycline Hyclate Nausea Only    HOME MEDICATIONS: Current Outpatient Prescriptions  Medication Sig Dispense Refill  .  busPIRone (BUSPAR) 15 MG tablet Take 15 mg by mouth 2 (two) times daily.     . cetirizine (ZYRTEC) 10 MG tablet Take 10 mg by mouth daily as needed for allergies.     . Cholecalciferol (VITAMIN D3) 2000 UNITS TABS Take by mouth daily.    . clonazePAM (KLONOPIN) 0.5 MG tablet Take 0.5 mg by mouth 3 (three) times daily. 1 tab three times a day    . cyclobenzaprine (FLEXERIL) 10 MG tablet Take 5 mg by mouth at bedtime as needed for muscle spasms.     . diclofenac sodium (VOLTAREN) 1 % GEL Apply 2 g topically as needed (artritisis).    . fluticasone (FLONASE) 50 MCG/ACT nasal spray Place 2 sprays into both nostrils at bedtime as needed for allergies.   4  . ibuprofen (ADVIL,MOTRIN) 600 MG tablet Take 600 mg by mouth every 6 (six) hours as needed (pain).    . Linaclotide (LINZESS) 145 MCG CAPS capsule Take 1 capsule (145 mcg total) by mouth daily. 90 capsule 3  .  Magnesium 400 MG TABS Take by mouth 2 (two) times daily.    Marland Kitchen omeprazole (PRILOSEC) 20 MG capsule Take 20 mg by mouth every evening.   6  . OVER THE COUNTER MEDICATION Take 1 capsule by mouth 3 (three) times daily. Hydro eye    . PARoxetine (PAXIL) 40 MG tablet Take 40 mg by mouth daily.     . vitamin B-12 (CYANOCOBALAMIN) 250 MCG tablet Take 250 mcg by mouth daily.    . [DISCONTINUED] tolterodine (DETROL LA) 4 MG 24 hr capsule Take 4 mg by mouth daily. Needs name brand only--generic does not work for patient.     No current facility-administered medications for this visit.    PAST MEDICAL HISTORY: Past Medical History:  Diagnosis Date  . Anxiety   . Barrett's esophagus    in the past  . Colon polyp   . Depression   . Essential tremor 2016   legs, arm,lower jaw  . Female bladder prolapse   . Fibroid   . Fibromyalgia   . GERD (gastroesophageal reflux disease)   . Heat exhaustion   . Herniated disc, cervical    C3  . Hiatal hernia   . History of cardiovascular stress test    Lexiscan Myoview 7/16:  EF 65%, inferolateral defect (prob artifactual), no ischemia  . History of recurrent UTIs   . Hyperlipidemia   . IBS (irritable bowel syndrome)    constipation  . Leg cramps   . Low back pain   . Lumbar herniated disc    L4,L5  . Osteoarthritis   . Osteopenia   . Rheumatoid arthritis, adult ([place]) 2016   both feet  . Skin cancer   . Spinal stenosis    lumbar  . Tremors of nervous system   . Vertigo     PAST SURGICAL HISTORY: Past Surgical History:  Procedure Laterality Date  . ABDOMINAL HYSTERECTOMY  2003   [facility] still has ovaries--[name]  . CERVICAL LAMINECTOMY  2002   C5/C6  . CHOLECYSTECTOMY  2008  . fractured ankle Left 10/2014  . ROTATOR CUFF REPAIR Right 2011    FAMILY HISTORY: Family History  Problem Relation Age of Onset  . Coronary artery disease Father 38       CABG 2002, followd by Dr, Ron Parker  . Prostate cancer Father   . Bladder Cancer Father   .  Skin cancer Father   . Diabetes Father 105  DM type 2  . Atrial fibrillation Mother   . Multiple myeloma Mother   . Skin cancer Mother   . Coronary artery disease Mother   . Irritable bowel syndrome Mother   . Cancer Mother        multiple myeloma  . Diabetes Maternal Grandmother   . Hypertension Maternal Grandmother   . Diabetes Maternal Grandfather   . Hypertension Maternal Grandfather   . Osteoarthritis Paternal Grandmother   . Cancer Maternal Aunt        bone cancer  . Colon cancer Neg Hx     SOCIAL HISTORY:  Social History   Social History  . Marital status: Married    Spouse name: [name]  . Number of children: 0  . Years of education: 12   Occupational History  . Retired Retired   Social History Main Topics  . Smoking status: Never Smoker  . Smokeless tobacco: Never Used  . Alcohol use No  . Drug use: No  . Sexual activity: Yes    Partners: Male    Birth control/ protection: Surgical     Comment: [facility] still has ovaries   Other Topics Concern  . Not on file   Social History Narrative   Lives at home with her husband.   Right-handed.   Rarely uses caffeine.     PHYSICAL EXAM   Vitals:   12/04/16 1359  BP: 109/65  Pulse: 63  Weight: 184 lb 8 oz (83.7 kg)  Height: 5' 10.5" (1.791 m)    Not recorded      Body mass index is 26.1 kg/m.  PHYSICAL EXAMNIATION:  Gen: NAD, conversant, well nourised, obese, well groomed                     Cardiovascular: Regular rate rhythm, no peripheral edema, warm, nontender. Eyes: Conjunctivae clear without exudates or hemorrhage Neck: Supple, no carotid bruise. Pulmonary: Clear to auscultation bilaterally   NEUROLOGICAL EXAM:  MENTAL STATUS: Speech:    Speech is normal; fluent and spontaneous with normal comprehension.  Cognition:    The patient is oriented to person, place, and time;     recent and remote memory intact;     language fluent;     normal attention, concentration,     fund of  knowledge.  CRANIAL NERVES: CN II: Visual fields are full to confrontation. Fundoscopic exam is normal with sharp discs and no vascular changes. Pupil were equal round reactive to light  CN III, IV, VI: extraocular movement are normal. No ptosis. CN V: Facial sensation is intact to pinprick in all 3 divisions bilaterally. Corneal responses are intact.  CN VII: Face is symmetric with normal eye closure and smile. CN VIII: Hearing is normal to rubbing fingers CN IX, X: Palate elevates symmetrically. Phonation is normal. CN XI: Head turning and shoulder shrug are intact CN XII: Tongue is midline with normal movements and no atrophy.  MOTOR:.Bilateral hands posturing tremor, no rigidity. Muscle bulk and tone are normal. Muscle strength is normal.  REFLEXES: Reflexes are  3  and symmetric at the biceps, triceps, knees, and ankles. Plantar responses are flexor.  SENSORY: Light touch, pinprick, position sense, and vibration sense are intact in fingers and toes.  COORDINATION: Rapid alternating movements and fine finger movements are intact. There is no dysmetria on finger-to-nose and heel-knee-shin. There are no abnormal or extraneous movements.   GAIT/STANCE: Posture is normal. Gait is steady with normal steps, base, arm swing, and turning.  Heel and toe walking are normal. Tandem gait is normal.  Romberg is absent.   DIAGNOSTIC DATA (LABS, IMAGING, TESTING) - I reviewed patient records, labs, notes, testing and imaging myself where available.  Lab Results  Component Value Date   WBC 4.9 02/22/2016   HGB 13.7 02/22/2016   HCT 41.3 02/22/2016   MCV 94.1 02/22/2016   PLT 231 02/22/2016      Component Value Date/Time   NA 143 02/22/2016 1347   K 4.5 02/22/2016 1347   CL 103 02/22/2016 1347   CO2 29 02/22/2016 1347   GLUCOSE 86 02/22/2016 1347   BUN 12 02/22/2016 1347   CREATININE 0.75 02/22/2016 1347   CALCIUM 9.1 02/22/2016 1347   PROT 6.9 02/22/2016 1347   ALBUMIN 4.4  02/22/2016 1347   AST 28 02/22/2016 1347   ALT 24 02/22/2016 1347   ALKPHOS 39 02/22/2016 1347   BILITOT 0.7 02/22/2016 1347   GFRNONAA >60 10/14/2014 1345   GFRAA >60 10/14/2014 1345   Lab Results  Component Value Date   CHOL 243 (H) 02/22/2016   HDL 54 02/22/2016   LDLCALC 163 (H) 02/22/2016   TRIG 129 02/22/2016   CHOLHDL 4.5 02/22/2016   ASSESSMENT AND PLAN  REHANNA OLOUGHLIN is a 66 y.o. 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 Ph.D.  [facility] [address], [place], [place] [address] Ph: [phone] Fax: [phone]

## 2016-12-09 ENCOUNTER — Ambulatory Visit (INDEPENDENT_AMBULATORY_CARE_PROVIDER_SITE_OTHER): Payer: BLUE CROSS/BLUE SHIELD | Admitting: Gastroenterology

## 2016-12-09 ENCOUNTER — Encounter: Payer: Self-pay | Admitting: Gastroenterology

## 2016-12-09 VITALS — BP 100/54 | HR 63 | Ht 71.0 in | Wt 184.0 lb

## 2016-12-09 DIAGNOSIS — R0989 Other specified symptoms and signs involving the circulatory and respiratory systems: Secondary | ICD-10-CM

## 2016-12-09 DIAGNOSIS — F458 Other somatoform disorders: Secondary | ICD-10-CM

## 2016-12-09 DIAGNOSIS — R1013 Epigastric pain: Secondary | ICD-10-CM | POA: Diagnosis not present

## 2016-12-09 DIAGNOSIS — R141 Gas pain: Secondary | ICD-10-CM

## 2016-12-09 DIAGNOSIS — K219 Gastro-esophageal reflux disease without esophagitis: Secondary | ICD-10-CM

## 2016-12-09 DIAGNOSIS — R140 Abdominal distension (gaseous): Secondary | ICD-10-CM | POA: Insufficient documentation

## 2016-12-09 DIAGNOSIS — K644 Residual hemorrhoidal skin tags: Secondary | ICD-10-CM | POA: Diagnosis not present

## 2016-12-09 MED ORDER — HYDROCORTISONE 2.5 % RE CREA: 1.0000 "application " | TOPICAL_CREAM | Freq: Two times a day (BID) | RECTAL | 1 refills | Status: DC

## 2016-12-09 MED ORDER — PANTOPRAZOLE SODIUM 40 MG PO TBEC: 40.0000 mg | DELAYED_RELEASE_TABLET | Freq: Two times a day (BID) | ORAL | 3 refills | Status: DC

## 2016-12-09 NOTE — Patient Instructions (Signed)
We have sent a prescription for Hydrocortisone cream to your pharmacy. You should apply a pea size amount to your rectum twice a day x 10 days. Insert up into rectum until the first knuckle of your index finger.  Stop Omeprazole. Start Pantoprazole 40 mg twice a day.   Start IB gard daily as directed.   Start a daily probiotic  Such as Florastor, Culturelle or VSL #3.   You have been scheduled for an Upper GI Series and Small Bowel Follow Thru at [facility]. Your appointment is on 12/18/16 am at 11 am. Please arrive 15 minutes prior to your test for registration. Make certain not to have anything to eat or drink 3 hours before your test. If you need to reschedule, please contact radiology at [phone]. --------------------------------------------------------------------------------------------------------------- An upper GI series uses x rays to help diagnose problems of the upper GI tract, which includes the esophagus, stomach, and duodenum. The duodenum is the first part of the small intestine. An upper GI series is conducted by a radiology technologist or a radiologist-a doctor who specializes in x-ray imaging-at a hospital or outpatient center. While sitting or standing in front of an x-ray machine, the patient drinks barium liquid, which is often white and has a chalky consistency and taste. The barium liquid coats the lining of the upper GI tract and makes signs of disease show up more clearly on x rays. X-ray video, called fluoroscopy, is used to view the barium liquid moving through the esophagus, stomach, and duodenum. Additional x rays and fluoroscopy are performed while the patient lies on an x-ray table. To fully coat the upper GI tract with barium liquid, the technologist or radiologist may press on the abdomen or ask the patient to change position. Patients hold still in various positions, allowing the technologist or radiologist to take x rays of the upper GI tract at  different angles. If a technologist conducts the upper GI series, a radiologist will later examine the images to look for problems.  This test typically takes about 1 hour to complete --------------------------------------------------------------------------------------------------------------------------------------------- The Small Bowel Follow Thru examination is used to visualize the entire small bowel (intestines); specifically the connection between the small and large intestine. You will be positioned on a flat x-ray table and an image of your abdomen taken. Then the technologist will show the x-ray to the radiologist. The radiologist will instruct your technologist how much (1-2 cups) barium sulfate you will drink and when to begin taking the timed x-rays, usually 15-30 minutes after you begin drinking. Barium is a harmless substance that will highlight your small intestine by absorbing x-ray. The taste is chalky and it feels very heavy both in the cup and in your stomach.  After the first x-ray is taken and shown to the radiologist, he/she will determine when the next image is to be taken. This is repeated until the barium has reached the end of the small intestine and enters the beginning of the colon (cecum). At such time when the barium spills into the colon, you will be positioned on the x-ray table once again. The radiologist will use a fluoroscopic camera to take some detailed pictures of the connection between your small intestine and colon. The fluoroscope is an x-ray unit that works with a television/computer screen. The radiologist will apply pressure to your abdomen with his/her hand and a lead glove, a plastic paddle, or a paddle with an inflated rubber balloon on the end. This is to spread apart your loops  of intestine so he/she can see all areas.   This test typically takes around 1 hour to complete.  Important: Drink plenty of water (8-10 cups/day) for a few days following the  procedure to avoid constipation and blockage. The barium will make your stools white for a few days. --------------------------------------------------------------------------------------------------------------------------------------------

## 2016-12-09 NOTE — Progress Notes (Signed)
12/09/2016 Angela Ray [id number] [birth year]   HISTORY OF PRESENT ILLNESS:  This is a 66 year old female who is known to Dr. Silverio Decamp.  She presents for office today with several complaints.  First, she complains that her heartburn and reflux is very poorly controlled. She does not think her omeprazole 20 mg twice daily is working for her any longer. Says that acid comes up into her throat.  She also complains of what sounds like a globus sensation, describing that she always feels like there is something in her throat. She also reports intermittent episodes of epigastric discomfort that initially would wake her up in the middle of the night on occasion, but now are occurring on a daily basis randomly throughout the day. She says that this is not pain, but describes it as someone is inflating and deflating a balloon. Says that will occur a few times during each episode before resolving.  This has been occurring for about the past month.  She also complains of intermittent discomfort and rectal bleeding with bowel movements. She says that she tends to be constipated, but takes stool softeners daily and believes that these work well for her.  Lastly, she complains of a lot of flatulence. Says that she constantly just has a lot of gas when standing up, walking, etc.  Last colonoscopy was November 2016 at which time she sent have 2 polyps that were removed, one was a tubular adenoma and the other was a benign lymphoid aggregate. EGD was performed at the same time and showed irregular Z line, candidal esophagitis for which she was treated with nystatin swish and swallow, and gastropathy. Gastric biopsy showed chronic gastritis without H. pylori, distal esophageal biopsies showed normal mucosa with changes consistent with reflux, mid esophageal biopsies confirmed Candida esophagitis.   Past Medical History:  Diagnosis Date  . Anxiety   . Barrett's esophagus    in the past  . Colon polyp   .  Depression   . Essential tremor 2016   legs, arm,lower jaw  . Female bladder prolapse   . Fibroid   . Fibromyalgia   . GERD (gastroesophageal reflux disease)   . Heat exhaustion   . Herniated disc, cervical    C3  . Hiatal hernia   . History of cardiovascular stress test    Lexiscan Myoview 7/16:  EF 65%, inferolateral defect (prob artifactual), no ischemia  . History of recurrent UTIs   . Hyperlipidemia   . IBS (irritable bowel syndrome)    constipation  . Leg cramps   . Low back pain   . Lumbar herniated disc    L4,L5  . Osteoarthritis   . Osteopenia   . Rheumatoid arthritis, adult ([place]) 2016   both feet  . Skin cancer   . Spinal stenosis    lumbar  . Tremors of nervous system   . Vertigo    Past Surgical History:  Procedure Laterality Date  . ABDOMINAL HYSTERECTOMY  2003   TAH still has ovaries--[name]  . CERVICAL LAMINECTOMY  2002   C5/C6  . CHOLECYSTECTOMY  2008  . fractured ankle Left 10/2014  . ROTATOR CUFF REPAIR Right 2011    reports that she has never smoked. She has never used smokeless tobacco. She reports that she does not drink alcohol or use drugs. family history includes Atrial fibrillation in her mother; Bladder Cancer in her father; Cancer in her maternal aunt and mother; Coronary artery disease in her mother;  Coronary artery disease (age of onset: 62) in her father; Diabetes in her maternal grandfather and maternal grandmother; Diabetes (age of onset: 106) in her father; Hypertension in her maternal grandfather and maternal grandmother; Irritable bowel syndrome in her mother; Multiple myeloma in her mother; Osteoarthritis in her paternal grandmother; Prostate cancer in her father; Skin cancer in her father and mother. Allergies  Allergen Reactions  . Aciphex [Rabeprazole Sodium] Other (See Comments)    REACTION: "FELT LIKE INDIGESTION"  . Bupropion Hcl Other (See Comments)    REACTION: "DIDN'T MAKE ME FEEL RIGHT IN THE HEAD"  . Cephalexin Nausea  Only and Other (See Comments)    REACTION: "GAVE YEAST INFECTION"  . Codeine Other (See Comments)    REACTION: "SEVERE NAUSEA, HEART PALPITATION, PASSING OUT, HEART ATTACK LIKE SYMPTOMS"  . Codeine Phosphate Other (See Comments)    REACTION: "SEVERE NAUSEA, HEART PALPITATION, PASSING OUT, HEART ATTACK LIKE SYMPTOMS"  . Esomeprazole Magnesium Other (See Comments)    Nexium=REACTION: " CAUSE TASTE BUDS TO FALL OUT"  . Hydrocodone-Acetaminophen Other (See Comments)    REACTION: "SEVERE NAUSEA, HEART PALPITATION, PASSING OUT, HEART ATTACK LIKE SYMPTOMS"  . Oxycodone-Acetaminophen Other (See Comments)    REACTION: "SEVERE NAUSEA, HEART PALPITATION, PASSING OUT, HEART ATTACK LIKE SYMPTOMS"  . Sertraline Hcl Other (See Comments)    REACTION: "DIDN'T MAKE ME FEEL RIGHT IN THE HEAD"  . Doxycycline Hyclate Nausea Only      Outpatient Encounter Prescriptions as of 12/09/2016  Medication Sig  . Apoaequorin (PREVAGEN PO) Take 70 mg by mouth daily.  . busPIRone (BUSPAR) 15 MG tablet Take 15 mg by mouth 2 (two) times daily.   . Ca Phosphate-Cholecalciferol (CALTRATE GUMMY BITES PO) Take by mouth QID.  Marland Kitchen cetirizine (ZYRTEC) 10 MG tablet Take 10 mg by mouth daily as needed for allergies.   . clonazePAM (KLONOPIN) 0.5 MG tablet Take 0.5 mg by mouth 3 (three) times daily.   . Coenzyme Q10 (CO Q 10 PO) Take by mouth daily.  Marland Kitchen CRANBERRY-VITAMIN C PO Take by mouth 2 (two) times daily.  . Cyanocobalamin (B-12) 2500 MCG TABS Take by mouth daily.  . cyclobenzaprine (FLEXERIL) 10 MG tablet Take 10 mg by mouth as needed for muscle spasms.  . diclofenac sodium (VOLTAREN) 1 % GEL Apply 2 g topically as needed (artritisis).  Marland Kitchen docusate sodium (STOOL SOFTENER) 100 MG capsule Take 100 mg by mouth 2 (two) times daily as needed for mild constipation.  Marland Kitchen ELDERBERRY PO Take by mouth 2 (two) times daily.  Marland Kitchen ESTRACE VAGINAL 0.1 MG/GM vaginal cream Apply 1 application topically daily. Apply small amount externally to  vaginal area daily  . fluticasone (FLONASE) 50 MCG/ACT nasal spray Place 2 sprays into both nostrils at bedtime as needed for allergies.   Marland Kitchen gabapentin (NEURONTIN) 300 MG capsule Take 1 capsule (300 mg total) by mouth at bedtime.  Marland Kitchen ibuprofen (ADVIL,MOTRIN) 600 MG tablet Take 600 mg by mouth every 6 (six) hours as needed (pain).  . Multiple Vitamins-Minerals (HAIR/SKIN/NAILS/BIOTIN PO) Take by mouth daily.  . Niacin (VITAMIN B-3 PO) Take by mouth daily.  Marland Kitchen omeprazole (PRILOSEC) 20 MG capsule Take 1 capsule 30 min prior to breakfast.  . OVER THE COUNTER MEDICATION Take 1 capsule by mouth 3 (three) times daily. Hydro eye  . OVER THE COUNTER MEDICATION 50 mg at bedtime. Zyquil  . PARoxetine (PAXIL) 40 MG tablet Take 40 mg by mouth daily.   . Polyvinyl Alcohol-Povidone (REFRESH OP) Apply to eye as needed.  Marland Kitchen  propranolol (INDERAL) 20 MG tablet Take 1 tablet (20 mg total) by mouth 2 (two) times daily.  . traMADol (ULTRAM) 50 MG tablet Take 1 tablet (50 mg total) by mouth every 8 (eight) hours as needed.  . trimethoprim (TRIMPEX) 100 MG tablet Take 1 tablet by mouth daily.  . Vitamins-Lipotropics (LIPO-FLAVONOID PLUS PO) Take 1,000 mg by mouth 2 (two) times daily as needed.  . hydrocortisone (ANUSOL-HC) 2.5 % rectal cream Place 1 application rectally 2 (two) times daily.  . pantoprazole (PROTONIX) 40 MG tablet Take 1 tablet (40 mg total) by mouth 2 (two) times daily before a meal.   No facility-administered encounter medications on file as of 12/09/2016.      REVIEW OF SYSTEMS  : All other systems reviewed and negative except where noted in the History of Present Illness.   PHYSICAL EXAM: BP (!) 100/54 (BP Location: Right Arm, Patient Position: Sitting, Cuff Size: Normal)   Pulse 63   Ht 5' 11"  (1.803 m)   Wt 184 lb (83.5 kg)   LMP 02/09/2002 (Approximate)   BMI 25.66 kg/m  General: Well developed white female in no acute distress Head: Normocephalic and atraumatic Eyes:  Sclerae  anicteric, conjunctiva pink. Ears: Normal auditory acuity Lungs: Clear throughout to auscultation; no increased WOB. Heart: Regular rate and rhythm; no M/R/G. Abdomen: Soft, non-distended.  BS present.  Non-tender. Rectal:  External hemorrhoids noted, also I think some mild degree of rectal prolapse.  No bleeding noted.  Light brown stool on exam glove.  No masses felt on DRE. Musculoskeletal: Symmetrical with no gross deformities  Skin: No lesions on visible extremities Extremities: No edema  Neurological: Alert oriented x 4, grossly non-focal Psychological:  Alert and cooperative. Normal mood and affect  ASSESSMENT AND PLAN: -Chronic GERD:  Uncontrolled on omeprazole 20 mg BID.  Will change and increase to pantoprazole 40 mg BID for now and can always back dose down again in the future.   -Epigastric discomfort, globus sensation:  ? Related to uncontrolled reflux.  Will check UGI series. -Gas:  Will try daily probiotics and IBgard prn. -External hemorrhoids/? Mild rectal prolapse:  Will give hydrocortisone cream to use.  CC:  Copland, Gay Filler, MD

## 2016-12-12 NOTE — Progress Notes (Signed)
Reviewed and agree with documentation and assessment and plan. K. Veena Estel Scholze , MD   

## 2016-12-14 ENCOUNTER — Encounter: Payer: Self-pay | Admitting: Obstetrics and Gynecology

## 2016-12-18 ENCOUNTER — Ambulatory Visit (HOSPITAL_COMMUNITY)
Admission: RE | Admit: 2016-12-18 | Discharge: 2016-12-18 | Disposition: A | Discharge status: Home / Self Care | Payer: BLUE CROSS/BLUE SHIELD | Source: Ambulatory Visit | Attending: Gastroenterology | Admitting: Gastroenterology

## 2016-12-18 DIAGNOSIS — Z981 Arthrodesis status: Secondary | ICD-10-CM | POA: Insufficient documentation

## 2016-12-18 DIAGNOSIS — R141 Gas pain: Secondary | ICD-10-CM | POA: Diagnosis not present

## 2016-12-18 DIAGNOSIS — F458 Other somatoform disorders: Secondary | ICD-10-CM | POA: Diagnosis not present

## 2016-12-18 DIAGNOSIS — Z9049 Acquired absence of other specified parts of digestive tract: Secondary | ICD-10-CM | POA: Insufficient documentation

## 2016-12-18 DIAGNOSIS — J8410 Pulmonary fibrosis, unspecified: Secondary | ICD-10-CM | POA: Diagnosis not present

## 2016-12-18 DIAGNOSIS — K219 Gastro-esophageal reflux disease without esophagitis: Secondary | ICD-10-CM

## 2016-12-18 DIAGNOSIS — R1013 Epigastric pain: Secondary | ICD-10-CM | POA: Diagnosis not present

## 2016-12-18 DIAGNOSIS — K644 Residual hemorrhoidal skin tags: Secondary | ICD-10-CM

## 2016-12-18 DIAGNOSIS — R09A2 Foreign body sensation, throat: Secondary | ICD-10-CM

## 2016-12-18 DIAGNOSIS — R0989 Other specified symptoms and signs involving the circulatory and respiratory systems: Secondary | ICD-10-CM

## 2016-12-22 ENCOUNTER — Telehealth: Payer: Self-pay

## 2016-12-22 NOTE — Telephone Encounter (Signed)
Spoke with patient regarding BMD results. Advised results normal per Dr.Silva. Based on her fragility fracture she qualifies for treatment for reducing risk of fracture and advised to discuss this with her rheumatologist. She states her rheumatologist has retired but will be seeing Dr.Deveshwar soon.(BMD done at [facility] and report to be scanned into Epic)

## 2016-12-26 ENCOUNTER — Encounter: Payer: Self-pay | Admitting: Obstetrics and Gynecology

## 2017-01-01 ENCOUNTER — Telehealth: Payer: Self-pay | Admitting: Gastroenterology

## 2017-01-01 NOTE — Telephone Encounter (Signed)
Did she try the hydrocortisone cream?

## 2017-01-01 NOTE — Telephone Encounter (Signed)
Spoke to patient, apparently her husband picked up the hydrocortisone cream but she was not aware that this was in the house. She will try that and let us know if it doesn't help.

## 2017-01-01 NOTE — Telephone Encounter (Signed)
Left message for patient to call back, need to know if she tried the hydrocortisone cream that Janett Billow Z. prescribed at last visit.

## 2017-01-01 NOTE — Telephone Encounter (Signed)
Routed to [employer]. Do you want her to try something else?

## 2017-01-15 ENCOUNTER — Telehealth: Payer: Self-pay | Admitting: Family Medicine

## 2017-01-15 NOTE — Telephone Encounter (Signed)
Referral faxed to Dr Estanislado Pandy originally, her office declined referral, patient aware. She is going to call [facility]

## 2017-01-15 NOTE — Telephone Encounter (Signed)
Pt called says she wants referral for RA to Dr Estanislado Pandy in [place] since her previous RA doctor retired. Pt sates does not want to see Dr Lenna Gilford. Please refer to Deveshwar.

## 2017-02-10 ENCOUNTER — Ambulatory Visit (INDEPENDENT_AMBULATORY_CARE_PROVIDER_SITE_OTHER): Payer: BLUE CROSS/BLUE SHIELD | Admitting: Behavioral Health

## 2017-02-10 DIAGNOSIS — Z23 Encounter for immunization: Secondary | ICD-10-CM

## 2017-02-10 NOTE — Progress Notes (Signed)
Pre visit review using our clinic review tool, if applicable. No additional management support is needed unless otherwise documented below in the visit note.  Patient came in clinic today for influenza vaccination. IM injection was given in the left deltoid. Patient tolerated the injection well. No signs or symptoms of a reaction before leaving the nurse visit.

## 2017-03-05 NOTE — Progress Notes (Signed)
66 y.o. G82P0000 Married Caucasian female here for annual exam.    Seeing [name] for recurrent UTIs. On Trimethoprim daily.  Using vaginal/vulvar estrogen cream daily in small amount and using Lidocaine if has pain.   Had a pelvic US last year on 02/28/16 following her routine exam because I thought I may have felt a pelvic mass.  Her ovaries were normal and her uterus is absent from hysterectomy.   Has elevated cholesterol not tx with Rx.  She prefers to avoid this.  ROS - heat/cold intolerance, diarrhea and constipation due to IBS, urgency to urinate, muscle swelling/weakness in thighs/muscle and joint pain, depression and anxiety, hair loss, skin tags and seeing Dermatology for this.   SOC - Moved to [place]. Traveling more.  PCP:  Lamar Blinks, MD   Rheumatology:  Dr. [name].  Will see in November. Cardiology:  Dr. Angelena Form.  Patient's last menstrual period was 02/09/2002 (approximate).           Sexually active: Yes.   female The current method of family planning is status post hysterectomy.    Exercising: No.   Smoker:  no  Health Maintenance: Pap: 11-20-10 Neg History of abnormal Pap:  no MMG: 12-01-16 3D Density C/Neg/BiRads1:[facility] Colonoscopy: 04/04/15 Tubular Adenoma with Dr.[name];repeat in 3 years BMD: 12-01-16  Result : Normal:[facility] TDaP:  05/2014--had shingles and pneumonia vaccines as well. Gardasil:   no HIV: 02-22-16 NR Hep C: 02-22-16 Neg Screening Labs:  Hb today: labs drawn, Urine today: not done--sees urology   reports that she has never smoked. She has never used smokeless tobacco. She reports that she does not drink alcohol or use drugs.  Past Medical History:  Diagnosis Date  . Anxiety   . Barrett's esophagus    in the past  . Colon polyp   . Depression   . Dupuytren's contracture of both hands 10/2016   sees Dr.Gramig--[facility]  . Essential tremor 2016   legs, arm,lower jaw  . Female bladder prolapse   . Fibroid   .  Fibromyalgia   . GERD (gastroesophageal reflux disease)   . Heat exhaustion   . Herniated disc, cervical    C3  . Hiatal hernia   . History of cardiovascular stress test    Lexiscan Myoview 7/16:  EF 65%, inferolateral defect (prob artifactual), no ischemia  . History of recurrent UTIs   . Hyperlipidemia   . IBS (irritable bowel syndrome)    constipation  . Leg cramps   . Low back pain   . Lumbar herniated disc    L4,L5  . Osteoarthritis   . Osteopenia   . Rheumatoid arthritis, adult ([place]) 2016   both feet  . Skin cancer   . Spinal stenosis    lumbar  . Tremors of nervous system   . Vertigo     Past Surgical History:  Procedure Laterality Date  . ABDOMINAL HYSTERECTOMY  2003   TAH still has ovaries--[name]  . CERVICAL LAMINECTOMY  2002   C5/C6  . CHOLECYSTECTOMY  2008  . fractured ankle Left 10/2014  . ROTATOR CUFF REPAIR Right 2011    Current Outpatient Prescriptions  Medication Sig Dispense Refill  . Apoaequorin (PREVAGEN PO) Take 70 mg by mouth daily.    . busPIRone (BUSPAR) 15 MG tablet Take 15 mg by mouth 2 (two) times daily.     . Ca Phosphate-Cholecalciferol (CALTRATE GUMMY BITES PO) Take by mouth QID.    Marland Kitchen cetirizine (ZYRTEC) 10 MG tablet  Take 10 mg by mouth daily as needed for allergies.     . clonazePAM (KLONOPIN) 0.5 MG tablet Take 0.5 mg by mouth 3 (three) times daily.     . Coenzyme Q10 (CO Q 10 PO) Take by mouth daily.    Marland Kitchen CRANBERRY-VITAMIN C PO Take by mouth 2 (two) times daily.    . Cyanocobalamin (B-12) 2500 MCG TABS Take by mouth daily.    . cyclobenzaprine (FLEXERIL) 10 MG tablet Take 10 mg by mouth as needed for muscle spasms.    . diclofenac sodium (VOLTAREN) 1 % GEL Apply 2 g topically as needed (artritisis). 100 g 11  . docusate sodium (STOOL SOFTENER) 100 MG capsule Take 100 mg by mouth 2 (two) times daily as needed for mild constipation.    Marland Kitchen ELDERBERRY PO Take by mouth 2 (two) times daily.    Marland Kitchen ESTRACE VAGINAL 0.1 MG/GM vaginal cream  Apply 1 application topically daily. Apply small amount externally to vaginal area daily    . fluticasone (FLONASE) 50 MCG/ACT nasal spray Place 2 sprays into both nostrils at bedtime as needed for allergies.   4  . gabapentin (NEURONTIN) 300 MG capsule Take 1 capsule (300 mg total) by mouth at bedtime. 90 capsule 4  . hydrocortisone (ANUSOL-HC) 2.5 % rectal cream Place 1 application rectally 2 (two) times daily. 30 g 1  . ibuprofen (ADVIL,MOTRIN) 600 MG tablet Take 600 mg by mouth every 6 (six) hours as needed (pain).    Marland Kitchen lidocaine (XYLOCAINE) 5 % ointment Apply daily as needed    . Multiple Vitamins-Minerals (HAIR/SKIN/NAILS/BIOTIN PO) Take by mouth daily.    . Niacin (VITAMIN B-3 PO) Take by mouth daily.    Marland Kitchen omeprazole (PRILOSEC) 20 MG capsule Take 1 capsule 30 min prior to breakfast. 90 capsule 0  . OVER THE COUNTER MEDICATION Take 1 capsule by mouth 3 (three) times daily. Hydro eye    . OVER THE COUNTER MEDICATION 50 mg at bedtime. Zyquil    . pantoprazole (PROTONIX) 40 MG tablet Take 1 tablet (40 mg total) by mouth 2 (two) times daily before a meal. 60 tablet 3  . PARoxetine (PAXIL) 40 MG tablet Take 40 mg by mouth daily.     . Polyvinyl Alcohol-Povidone (REFRESH OP) Apply to eye as needed.    . propranolol (INDERAL) 20 MG tablet Take 1 tablet (20 mg total) by mouth 2 (two) times daily. 180 tablet 4  . traMADol (ULTRAM) 50 MG tablet Take 1 tablet (50 mg total) by mouth every 8 (eight) hours as needed. 30 tablet 1  . trimethoprim (TRIMPEX) 100 MG tablet Take 1 tablet by mouth daily.    . Vitamins-Lipotropics (LIPO-FLAVONOID PLUS PO) Take 1,000 mg by mouth 2 (two) times daily as needed.     No current facility-administered medications for this visit.     Family History  Problem Relation Age of Onset  . Coronary artery disease Father 66       CABG 2002, followd by Dr, Ron Parker  . Prostate cancer Father   . Bladder Cancer Father   . Skin cancer Father   . Diabetes Father 5       DM  type 2  . Atrial fibrillation Mother   . Multiple myeloma Mother   . Skin cancer Mother   . Coronary artery disease Mother   . Irritable bowel syndrome Mother   . Cancer Mother        multiple myeloma  . Diabetes Maternal Grandmother   .  Hypertension Maternal Grandmother   . Diabetes Maternal Grandfather   . Hypertension Maternal Grandfather   . Osteoarthritis Paternal Grandmother   . Cancer Maternal Aunt        bone cancer  . Colon cancer Neg Hx     ROS:  Pertinent items are noted in HPI.  Otherwise, a comprehensive ROS was negative.  Exam:   BP 112/60 (BP Location: Right Arm, Patient Position: Sitting, Cuff Size: Normal)   Pulse 64   Resp 16   Ht 5' 10.5" (1.791 m)   Wt 188 lb (85.3 kg)   LMP 02/09/2002 (Approximate)   BMI 26.59 kg/m     General appearance: alert, cooperative and appears stated age Head: Normocephalic, without obvious abnormality, atraumatic Neck: no adenopathy, supple, symmetrical, trachea midline and thyroid normal to inspection and palpation Lungs: clear to auscultation bilaterally Breasts: normal appearance, no masses or tenderness, No nipple retraction or dimpling, No nipple discharge or bleeding, No axillary or supraclavicular adenopathy Heart: regular rate and rhythm Abdomen: soft, non-tender; no masses, no organomegaly Extremities: extremities normal, atraumatic, no cyanosis or edema Skin: Skin color, texture, turgor normal. No rashes or lesions Lymph nodes: Cervical, supraclavicular, and axillary nodes normal. No abnormal inguinal nodes palpated Neurologic: Grossly normal  Pelvic: External genitalia:  no lesions              Urethra:  normal appearing urethra with no masses, tenderness or lesions              Bartholins and Skenes: normal                 Vagina: normal appearing vagina with normal color and discharge, no lesions              Cervix:  Absent.              Pap taken: No. Bimanual Exam:  Uterus:   Absent.               Adnexa:   Bowel versus ovary at vaginal cuff.               Rectal exam: Yes.  .  Confirms.              Anus:  normal sphincter tone, no lesions  Chaperone was present for exam.  Assessment:   Well woman visit with normal exam. Hx fragility fracture and normal bone density.  Elevated cholesterol. Normal pelvic US last year.  Recurrent UTI.  Tx through [name].  Plan: Mammogram screening discussed. Recommended self breast awareness. Pap and HR HPV as above. Guidelines for Calcium, Vitamin D, regular exercise program including cardiovascular and weight bearing exercise. Will see Dr. [name] to discuss bone health.  Routine labs.   If her cholesterol is elevated, she will see PCP.  We talked about reducing cholesterol through diet, exercise, Omega 3 fatty acids, and red yeast rice. Follow up annually and prn.   After visit summary provided.

## 2017-03-06 ENCOUNTER — Ambulatory Visit (INDEPENDENT_AMBULATORY_CARE_PROVIDER_SITE_OTHER): Payer: BLUE CROSS/BLUE SHIELD | Admitting: Obstetrics and Gynecology

## 2017-03-06 ENCOUNTER — Encounter: Payer: Self-pay | Admitting: Obstetrics and Gynecology

## 2017-03-06 VITALS — BP 112/60 | HR 64 | Resp 16 | Ht 70.5 in | Wt 188.0 lb

## 2017-03-06 DIAGNOSIS — Z01419 Encounter for gynecological examination (general) (routine) without abnormal findings: Secondary | ICD-10-CM | POA: Diagnosis not present

## 2017-03-06 NOTE — Patient Instructions (Signed)

## 2017-03-07 LAB — CBC
HEMATOCRIT: 41.5 % (ref 34.0–46.6)
HEMOGLOBIN: 13.6 g/dL (ref 11.1–15.9)
MCH: 31.3 pg (ref 26.6–33.0)
MCHC: 32.8 g/dL (ref 31.5–35.7)
MCV: 96 fL (ref 79–97)
Platelets: 240 10*3/uL (ref 150–379)
RBC: 4.34 x10E6/uL (ref 3.77–5.28)
RDW: 13.2 % (ref 12.3–15.4)
WBC: 4.1 10*3/uL (ref 3.4–10.8)

## 2017-03-07 LAB — LIPID PANEL
CHOL/HDL RATIO: 5.1 ratio — AB (ref 0.0–4.4)
Cholesterol, Total: 248 mg/dL — ABNORMAL HIGH (ref 100–199)
HDL: 49 mg/dL (ref >39)
LDL Calculated: 173 mg/dL — ABNORMAL HIGH (ref 0–99)
TRIGLYCERIDES: 131 mg/dL (ref 0–149)
VLDL Cholesterol Cal: 26 mg/dL (ref 5–40)

## 2017-03-07 LAB — COMPREHENSIVE METABOLIC PANEL
A/G RATIO: 2 (ref 1.2–2.2)
ALBUMIN: 4.6 g/dL (ref 3.6–4.8)
ALT: 26 IU/L (ref 0–32)
AST: 32 IU/L (ref 0–40)
Alkaline Phosphatase: 38 IU/L — ABNORMAL LOW (ref 39–117)
BUN / CREAT RATIO: 15 (ref 12–28)
BUN: 14 mg/dL (ref 8–27)
Bilirubin Total: 0.6 mg/dL (ref 0.0–1.2)
CALCIUM: 9.1 mg/dL (ref 8.7–10.3)
CO2: 28 mmol/L (ref 20–29)
Chloride: 102 mmol/L (ref 96–106)
Creatinine, Ser: 0.92 mg/dL (ref 0.57–1.00)
GFR, EST AFRICAN AMERICAN: 76 mL/min/{1.73_m2} (ref >59)
GFR, EST NON AFRICAN AMERICAN: 66 mL/min/{1.73_m2} (ref >59)
Globulin, Total: 2.3 g/dL (ref 1.5–4.5)
Glucose: 98 mg/dL (ref 65–99)
POTASSIUM: 4.8 mmol/L (ref 3.5–5.2)
Sodium: 143 mmol/L (ref 134–144)
TOTAL PROTEIN: 6.9 g/dL (ref 6.0–8.5)

## 2017-03-07 LAB — VITAMIN D 25 HYDROXY (VIT D DEFICIENCY, FRACTURES): Vit D, 25-Hydroxy: 46.9 ng/mL (ref 30.0–100.0)

## 2017-03-27 ENCOUNTER — Telehealth: Payer: Self-pay | Admitting: *Deleted

## 2017-03-27 NOTE — Telephone Encounter (Signed)
Received results from Dr. Melissa Noon office for [facility]; forwarded to provider/SLS 11/16

## 2017-04-11 NOTE — Progress Notes (Signed)
[place] at [facility] [address] [place], [place] [address] [phone] [id number]  Date:  04/13/2017   Name:  Angela Ray   DOB:  [birth year]   MRN:  [id number]  PCP:  Darreld Mclean, MD    Chief Complaint: Follow-up (Pt here for 6 month f/u visit. )   History of Present Illness:  Angela Ray is a 66 y.o. very pleasant female patient who presents with the following:  6 month follow-up visit Just saw [name] for her well woman exam I last saw her in July as follows: Here today as a new patient to establish care.  She has GYN ([name]), GI (Dr. Stevphen Rochester), neurology (Dr. Krista Blue) and urology (Dr. Alona Bene with [phone]) care already  She is changing her primary care as she recently moved to [facility] which is close to our location She loves living at [place]- she and her husband moved there from [place].  They did a lot of gardening and outdoor work, but as her health became not as good they downsized.  She still does some container gardening.  The move is an adjustment but overall is a good thing Her dog passed away a few months ago, and her elderly mother passed away a few years ago. She and her husband went away to the beach just the two of them this summer which they had not been able to do in years.   She notes TMJ for years, GERD with hiatal hernia She has had cervical spine surgery in the past, and has herniated discs in her lumbar spine as well She was told that she had "full blown" RA in the past- however she got a 2nd opinion from [facility] and was found to have mild RA and also OA- she is not on any immunomodulator right now/  However since [facility] has retired she needs to seek a new rheumatologist.  She has noted nodules along the flexor tendon of the long finger- in the palm of her hand- on her bilateral hands for a few months.  She also has essential tremor- inderal does help her She has "osteopenia in  my hips" per her report  She had hysterectomy due to fibroids.  She still has her ovaries   She uses gabapentin to help prevent cramps in her bilateral legs- mostly has her cramping at night She does use omeprazole for her GERD.  Also has history of hiatal hernia.  Notes that she has felt "a gas bubble" in her stomach for the last several months and wonders what she should do She takes klonopin, paxil ,buspar for her panic   She notes that she saw an RA specialist who said that she did not have RA but just has OA.  This is good news Over the last weekend she noted a pea sized nodule in her left breast. It is tender to the touch but she is not sure if this is in her mind or real   She also may have injuyred her left wrist a few months ago  She is trying to take care of herself- she is walking more and has been trying to lose weight  Wt Readings from Last 3 Encounters:  04/13/17 184 lb 6.4 oz (83.6 kg)  03/06/17 188 lb (85.3 kg)  12/09/16 184 lb (83.5 kg)   Discussed her high cholesterol recently noted by [name]- she does not want to start on a  cholesterol med however as her father and brother reacted poorly to statins.  She is working on her diet and exercise, and we planned to repeat a cholesterol panel in 6 months or so   Patient Active Problem List   Diagnosis Date Noted  . External hemorrhoid 12/09/2016  . Globus sensation 12/09/2016  . Gas pain 12/09/2016  . Abdominal pain, epigastric 12/09/2016  . Chronic insomnia 12/04/2016  . Essential tremor 12/04/2016  . Fibromyalgia 11/25/2016  . Lumbar stenosis 11/25/2016  . Paresthesia 05/25/2015  . Tremor 05/25/2015  . IBS (irritable bowel syndrome) 02/07/2015  . Recurrent UTI 02/07/2015  . Functional constipation 12/21/2013  . Mixed incontinence 12/16/2012  . Chest pain 06/24/2011  . Anxiety 07/11/2008  . GERD 07/05/2008  . GASTRITIS 07/05/2008  . HIATAL HERNIA 07/05/2008  . GALLSTONES 07/05/2008    Past Medical History:   Diagnosis Date  . Anxiety   . Barrett's esophagus    in the past  . Colon polyp   . Depression   . Dupuytren's contracture of both hands 10/2016   sees Dr.Gramig--[facility]  . Essential tremor 2016   legs, arm,lower jaw  . Female bladder prolapse   . Fibroid   . Fibromyalgia   . GERD (gastroesophageal reflux disease)   . Heat exhaustion   . Herniated disc, cervical    C3  . Hiatal hernia   . History of cardiovascular stress test    Lexiscan Myoview 7/16:  EF 65%, inferolateral defect (prob artifactual), no ischemia  . History of recurrent UTIs   . Hyperlipidemia   . IBS (irritable bowel syndrome)    constipation  . Leg cramps   . Low back pain   . Lumbar herniated disc    L4,L5  . Osteoarthritis   . Osteopenia   . Rheumatoid arthritis, adult ([place]) 2016   both feet  . Skin cancer   . Spinal stenosis    lumbar  . Tremors of nervous system   . Vertigo     Past Surgical History:  Procedure Laterality Date  . ABDOMINAL HYSTERECTOMY  2003   TAH still has ovaries--[name]  . CERVICAL LAMINECTOMY  2002   C5/C6  . CHOLECYSTECTOMY  2008  . fractured ankle Left 10/2014  . ROTATOR CUFF REPAIR Right 2011    Social History   Tobacco Use  . Smoking status: Never Smoker  . Smokeless tobacco: Never Used  Substance Use Topics  . Alcohol use: No    Alcohol/week: 0.0 oz    Comment: occ glass of wine  . Drug use: No    Family History  Problem Relation Age of Onset  . Coronary artery disease Father 78       CABG 2002, followd by Dr, Ron Parker  . Prostate cancer Father   . Bladder Cancer Father   . Skin cancer Father   . Diabetes Father 14       DM type 2  . Atrial fibrillation Mother   . Multiple myeloma Mother   . Skin cancer Mother   . Coronary artery disease Mother   . Irritable bowel syndrome Mother   . Cancer Mother        multiple myeloma  . Diabetes Maternal Grandmother   . Hypertension Maternal Grandmother   . Diabetes Maternal Grandfather   .  Hypertension Maternal Grandfather   . Osteoarthritis Paternal Grandmother   . Cancer Maternal Aunt        bone cancer  . Colon cancer Neg Hx  Allergies  Allergen Reactions  . Aciphex [Rabeprazole Sodium] Other (See Comments)    REACTION: "FELT LIKE INDIGESTION"  . Bupropion Hcl Other (See Comments)    REACTION: "DIDN'T MAKE ME FEEL RIGHT IN THE HEAD"  . Cephalexin Nausea Only and Other (See Comments)    REACTION: "GAVE YEAST INFECTION"  . Codeine Other (See Comments)    REACTION: "SEVERE NAUSEA, HEART PALPITATION, PASSING OUT, HEART ATTACK LIKE SYMPTOMS"  . Codeine Phosphate Other (See Comments)    REACTION: "SEVERE NAUSEA, HEART PALPITATION, PASSING OUT, HEART ATTACK LIKE SYMPTOMS"  . Esomeprazole Magnesium Other (See Comments)    Nexium=REACTION: " CAUSE TASTE BUDS TO FALL OUT"  . Hydrocodone-Acetaminophen Other (See Comments)    REACTION: "SEVERE NAUSEA, HEART PALPITATION, PASSING OUT, HEART ATTACK LIKE SYMPTOMS"  . Oxycodone-Acetaminophen Other (See Comments)    REACTION: "SEVERE NAUSEA, HEART PALPITATION, PASSING OUT, HEART ATTACK LIKE SYMPTOMS"  . Sertraline Hcl Other (See Comments)    REACTION: "DIDN'T MAKE ME FEEL RIGHT IN THE HEAD"  . Doxycycline Hyclate Nausea Only    Medication list has been reviewed and updated.  Current Outpatient Medications on File Prior to Visit  Medication Sig Dispense Refill  . Apoaequorin (PREVAGEN PO) Take 70 mg by mouth daily.    . busPIRone (BUSPAR) 15 MG tablet Take 15 mg by mouth 2 (two) times daily.     . Ca Phosphate-Cholecalciferol (CALTRATE GUMMY BITES PO) Take by mouth QID.    Marland Kitchen cetirizine (ZYRTEC) 10 MG tablet Take 10 mg by mouth daily as needed for allergies.     . clonazePAM (KLONOPIN) 0.5 MG tablet Take 0.5 mg by mouth 3 (three) times daily.     . Coenzyme Q10 (CO Q 10 PO) Take by mouth daily.    . Cyanocobalamin (B-12) 2500 MCG TABS Take by mouth daily.    . cyclobenzaprine (FLEXERIL) 10 MG tablet Take 10 mg by mouth as  needed for muscle spasms.    . diclofenac sodium (VOLTAREN) 1 % GEL Apply 2 g topically as needed (artritisis). 100 g 11  . docusate sodium (STOOL SOFTENER) 100 MG capsule Take 100 mg by mouth 2 (two) times daily as needed for mild constipation.    Marland Kitchen ESTRACE VAGINAL 0.1 MG/GM vaginal cream Apply 1 application topically daily. Apply small amount externally to vaginal area daily    . fluticasone (FLONASE) 50 MCG/ACT nasal spray Place 2 sprays into both nostrils at bedtime as needed for allergies.   4  . gabapentin (NEURONTIN) 300 MG capsule Take 1 capsule (300 mg total) by mouth at bedtime. 90 capsule 4  . hydrocortisone (ANUSOL-HC) 2.5 % rectal cream Place 1 application rectally 2 (two) times daily. 30 g 1  . ibuprofen (ADVIL,MOTRIN) 600 MG tablet Take 600 mg by mouth every 6 (six) hours as needed (pain).    Marland Kitchen lidocaine (XYLOCAINE) 5 % ointment Apply daily as needed    . Multiple Vitamins-Minerals (HAIR/SKIN/NAILS/BIOTIN PO) Take by mouth daily.    . Niacin (VITAMIN B-3 PO) Take by mouth daily.    Marland Kitchen OVER THE COUNTER MEDICATION Take 1 capsule by mouth 3 (three) times daily. Hydro eye    . OVER THE COUNTER MEDICATION 50 mg at bedtime. Zyquil    . pantoprazole (PROTONIX) 40 MG tablet Take 1 tablet (40 mg total) by mouth 2 (two) times daily before a meal. 60 tablet 3  . PARoxetine (PAXIL) 40 MG tablet Take 40 mg by mouth daily.     . propranolol (INDERAL) 20 MG tablet  Take 1 tablet (20 mg total) by mouth 2 (two) times daily. 180 tablet 4  . traMADol (ULTRAM) 50 MG tablet Take 1 tablet (50 mg total) by mouth every 8 (eight) hours as needed. 30 tablet 1  . trimethoprim (TRIMPEX) 100 MG tablet Take 1 tablet by mouth daily.    . [DISCONTINUED] tolterodine (DETROL LA) 4 MG 24 hr capsule Take 4 mg by mouth daily. Needs name brand only--generic does not work for patient.     No current facility-administered medications on file prior to visit.     Review of Systems:  As per HPI- otherwise  negative.   Physical Examination: Vitals:   04/13/17 1104  BP: 112/70  Pulse: 75  Temp: 98.5 F (36.9 C)  SpO2: 98%   Vitals:   04/13/17 1104  Weight: 184 lb 6.4 oz (83.6 kg)  Height: 5' 10.5" (1.791 m)   Body mass index is 26.08 kg/m. Ideal Body Weight: Weight in (lb) to have BMI = 25: 176.4  GEN: WDWN, NAD, Non-toxic, A & O x 3, mild overweight, looks well HEENT: Atraumatic, Normocephalic. Neck supple. No masses, No LAD. Ears and Nose: No external deformity.  Bilateral TM wnl, oropharynx normal.  PEERL,EOMI.   CV: RRR, No M/G/R. No JVD. No thrill. No extra heart sounds. PULM: CTA B, no wheezes, crackles, rhonchi. No retractions. No resp. distress. No accessory muscle use. ABD: S, NT, ND EXTR: No c/c/e NEURO Normal gait.  PSYCH: Normally interactive. Conversant. Not depressed or anxious appearing.  Calm demeanor.  Tiny nodule in left breast at 2:00 location- pea sized, feels fixed to breast  Left wrist : she has a positive finkelstein test. Otherwise wrist and hand are normal.  No swelling, redness, or tenderness to palpation or ROM    Assessment and Plan: Mass of left breast - Plan: MM DIAG BREAST TOMO UNI LEFT, US BREAST COMPLETE UNI LEFT INC AXILLA  Sprain of left wrist, initial encounter  Mixed hyperlipidemia  Referral for further eval of breast nodule asap She will get an OTC wrist splint to use as needed- will let me know if not better in the next 2-3 weeks She plans to try and control her lipids with diet and exercise, and we will recheck in 6 months   Signed Lamar Blinks, MD

## 2017-04-13 ENCOUNTER — Encounter: Payer: Self-pay | Admitting: Family Medicine

## 2017-04-13 ENCOUNTER — Ambulatory Visit: Payer: BLUE CROSS/BLUE SHIELD | Admitting: Family Medicine

## 2017-04-13 VITALS — BP 112/70 | HR 75 | Temp 98.5°F | Ht 70.5 in | Wt 184.4 lb

## 2017-04-13 DIAGNOSIS — S63502A Unspecified sprain of left wrist, initial encounter: Secondary | ICD-10-CM | POA: Diagnosis not present

## 2017-04-13 DIAGNOSIS — N6320 Unspecified lump in the left breast, unspecified quadrant: Secondary | ICD-10-CM | POA: Diagnosis not present

## 2017-04-13 DIAGNOSIS — E782 Mixed hyperlipidemia: Secondary | ICD-10-CM | POA: Diagnosis not present

## 2017-04-13 NOTE — Patient Instructions (Signed)
Good to see you today!  I am ordering a diagnostic mammogram/ ultrasound of your left breast.  Please let me know if you don't hear about this appt soon For your left wrist, try wearing an OTC splint at night and as needed during the day for 2-3 weeks. Let me know if not helpful for you!

## 2017-05-04 ENCOUNTER — Other Ambulatory Visit: Payer: Self-pay

## 2017-05-04 MED ORDER — PANTOPRAZOLE SODIUM 40 MG PO TBEC: 40.0000 mg | DELAYED_RELEASE_TABLET | Freq: Two times a day (BID) | ORAL | 0 refills | Status: DC

## 2017-05-04 NOTE — Telephone Encounter (Signed)
90 day supply of pantoprazole sent in as requested by [facility].

## 2017-05-11 ENCOUNTER — Telehealth: Payer: Self-pay | Admitting: Family Medicine

## 2017-05-11 DIAGNOSIS — N6320 Unspecified lump in the left breast, unspecified quadrant: Secondary | ICD-10-CM

## 2017-05-11 NOTE — Telephone Encounter (Signed)
Copied from [place]. Topic: Referral - Status >> Apr 16, 2017  4:04 PM Arletha Grippe wrote: Reason for CRM:  pt called - she has not heard back from [facility] to do her ultrasound, please call pt at [phone] >> May 11, 2017 12:27 PM Oneta Rack wrote: Patient wanted to inform Dr. Lorelei Pont lump is no longer there and everything appears to be fine but wanted to make PCP aware that she never heard from [facility].

## 2017-05-11 NOTE — Telephone Encounter (Signed)
Called [facility]- they did not receive this order. It is in computer so we are not sure what went wrong.  Will re-order now She was to have a LEFT diagnostic mammo with [place]  Called pt to let her know- LMOM. Also encouraged her to go ahead and call [facility] to get her appt time secured, let me know if any other problems come up

## 2017-05-11 NOTE — Telephone Encounter (Signed)
FYI

## 2017-05-11 NOTE — Telephone Encounter (Signed)
Copied from [place]. Topic: Referral - Status >> Apr 16, 2017  4:04 PM Arletha Grippe wrote: Reason for CRM:  pt called - she has not heard back from [facility] to do her ultrasound, please call pt at [phone]

## 2017-05-13 MED FILL — busPIRone HCL 30 MG TABS: 30 | 30 days supply | Qty: 30 | Fill #0

## 2017-06-11 ENCOUNTER — Encounter: Payer: Self-pay | Admitting: Family Medicine

## 2017-06-12 ENCOUNTER — Telehealth: Payer: Self-pay | Admitting: *Deleted

## 2017-06-12 NOTE — Telephone Encounter (Signed)
Received Ultrasound results from [facility]; forwarded to provider/SLS 02/01

## 2017-06-19 MED FILL — busPIRone HCL 30 MG TABS: 30 | 30 days supply | Qty: 30 | Fill #1

## 2017-07-27 ENCOUNTER — Other Ambulatory Visit: Payer: Self-pay | Admitting: Emergency Medicine

## 2017-07-27 MED ORDER — PANTOPRAZOLE SODIUM 40 MG PO TBEC: 40.0000 mg | DELAYED_RELEASE_TABLET | Freq: Two times a day (BID) | ORAL | 3 refills | Status: DC

## 2017-07-27 MED FILL — busPIRone HCL 30 MG TABS: 30 | 30 days supply | Qty: 30 | Fill #2

## 2017-08-23 MED FILL — busPIRone HCL 30 MG TABS: 30 | 30 days supply | Qty: 30 | Fill #3

## 2017-09-18 ENCOUNTER — Encounter: Payer: Self-pay | Admitting: Family Medicine

## 2017-09-18 ENCOUNTER — Ambulatory Visit: Payer: BLUE CROSS/BLUE SHIELD | Admitting: Family Medicine

## 2017-09-18 VITALS — BP 108/72 | HR 60 | Temp 98.6°F | Ht 70.5 in | Wt 175.1 lb

## 2017-09-18 DIAGNOSIS — J0100 Acute maxillary sinusitis, unspecified: Secondary | ICD-10-CM

## 2017-09-18 MED ORDER — AMOXICILLIN-POT CLAVULANATE 875-125 MG PO TABS
1.0000 | ORAL_TABLET | Freq: Two times a day (BID) | ORAL | 0 refills | Status: DC
Start: 2017-09-18 — End: 2017-10-12

## 2017-09-18 NOTE — Progress Notes (Signed)
Chief Complaint  Patient presents with  . Sinusitis  . Fever    Angela Ray here for URI complaints.  Duration: 9 days  Associated symptoms: Fever (100 F), sinus congestion, sinus pain, rhinorrhea, cough and ear fullness Denies: ear pain, ear drainage, sore throat, wheezing, shortness of breath, myalgia Treatment to date: INCS, saline rinse Sick contacts: No  ROS:  Const: Denies current fevers HEENT: As noted in HPI Lungs: No SOB  Past Medical History:  Diagnosis Date  . Anxiety   . Barrett's esophagus    in the past  . Colon polyp   . Depression   . Dupuytren's contracture of both hands 10/2016   sees Dr.Gramig--[facility]  . Essential tremor 2016   legs, arm,lower jaw  . Female bladder prolapse   . Fibroid   . Fibromyalgia   . GERD (gastroesophageal reflux disease)   . Heat exhaustion   . Herniated disc, cervical    C3  . Hiatal hernia   . History of cardiovascular stress test    Lexiscan Myoview 7/16:  EF 65%, inferolateral defect (prob artifactual), no ischemia  . History of recurrent UTIs   . Hyperlipidemia   . IBS (irritable bowel syndrome)    constipation  . Leg cramps   . Low back pain   . Lumbar herniated disc    L4,L5  . Osteoarthritis   . Osteopenia   . Rheumatoid arthritis, adult ([place]) 2016   both feet  . Skin cancer   . Spinal stenosis    lumbar  . Tremors of nervous system   . Vertigo    BP 108/72 (BP Location: Left Arm, Patient Position: Sitting, Cuff Size: Normal)   Pulse 60   Temp 98.6 F (37 C) (Oral)   Ht 5' 10.5" (1.791 m)   Wt 175 lb 1 oz (79.4 kg)   LMP 02/09/2002 (Approximate)   SpO2 96%   BMI 24.76 kg/m  General: Awake, alert, appears stated age HEENT: AT, [place], ears patent b/l and TM's neg, nares patent w/o discharge, +TTP over max sinuses b/l, pharynx pink and without exudates, MMM Neck: No masses or asymmetry Heart: RRR Lungs: CTAB, no accessory muscle use Psych: Age appropriate judgment and insight, normal  mood and affect  Acute non-recurrent maxillary sinusitis - Plan: amoxicillin-clavulanate (AUGMENTIN) 875-125 MG tablet  Orders as above. Pocket rx instructions provided. Continue to push fluids, practice good hand hygiene, cover mouth when coughing. F/u prn. If starting to experience fevers, shaking, or shortness of breath, seek immediate care. Pt voiced understanding and agreement to the plan.  [place], DO 09/18/17 1:50 PM

## 2017-09-18 NOTE — Progress Notes (Signed)
Pre visit review using our clinic review tool, if applicable. No additional management support is needed unless otherwise documented below in the visit note. 

## 2017-09-18 NOTE — Patient Instructions (Signed)
Continue to push fluids, practice good hand hygiene, and cover your mouth if you cough.  If you start having fevers, shaking or shortness of breath, seek immediate care.  Most sinus infections are viral in etiology and antibiotics will not be helpful. That being said, if you start having worsening symptoms over 3 days, you are worsening by day 10 or not improving by day 14, go ahead and take it. You are on Day 9 as of now.   Let us know if you need anything.

## 2017-09-22 MED FILL — busPIRone HCL 30 MG TABS: 30 | 30 days supply | Qty: 30 | Fill #4

## 2017-09-29 IMAGING — RF DG UGI W/ HIGH DENSITY W/KUB
12 of 17 series · 13 of 24 positions shown · non-contrast
Comparison: None.

CLINICAL DATA: Bubble sensation in epigastric region for 2 months,
getting better after change in reflux medication.Globus sensation.

EXAM:
UPPER GI SERIES WITH KUB
TECHNIQUE: After obtaining a scout radiograph a routine upper GI series was
performed using thin and high density barium.
FLUOROSCOPY TIME:  Fluoroscopy Time:  2 minutes 18 seconds
Radiation Exposure Index (if provided by the fluoroscopic device):
20.8 mGy
Number of Acquired Spot Images: 5

[Series 1: t abdomen supine · 0.15mm/px · 1 of 1 slices shown]
[im 1/1]
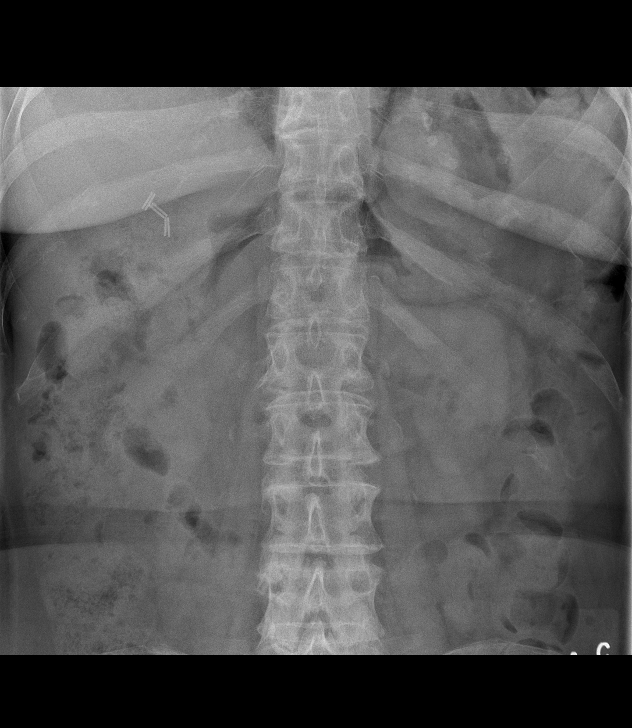

[Series 2: cp_standard · 0.36mm/px · 1 of 36 frames shown (1 of 9)]
[frame 31/36]
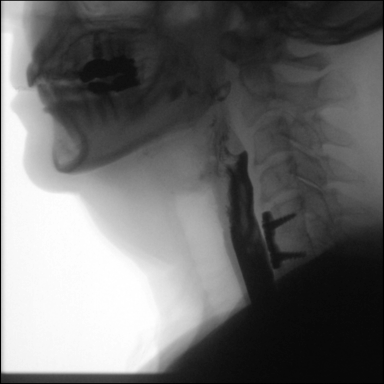

[Series 3: cp_standard · 0.36mm/px · 1 of 58 frames shown (2 of 9)]
[frame 26/58]
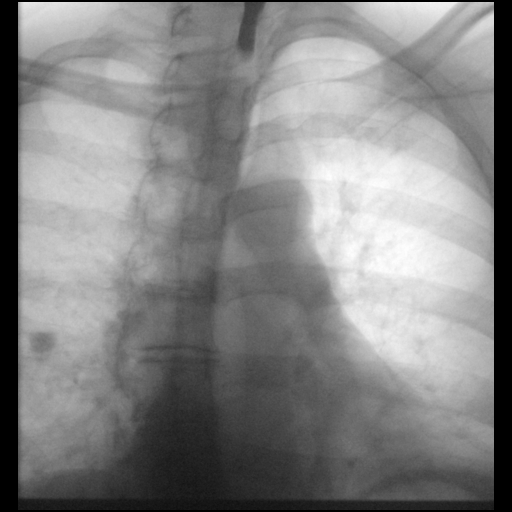

[Series 4: cp_standard · 0.36mm/px · 1 of 10 frames shown (3 of 9)]
[frame 5/10]
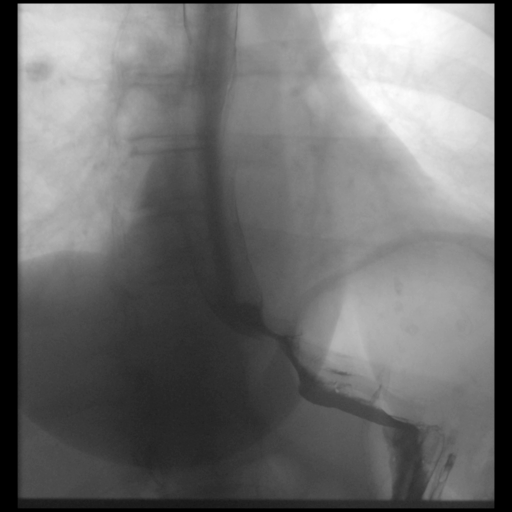

[Series 5: cp_standard · 0.36mm/px · 2 of 19 frames shown (4 of 9)]
[frame 3/19]
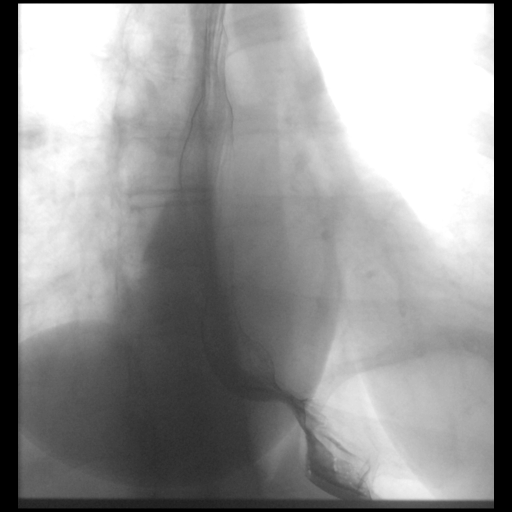
[frame 18/19]
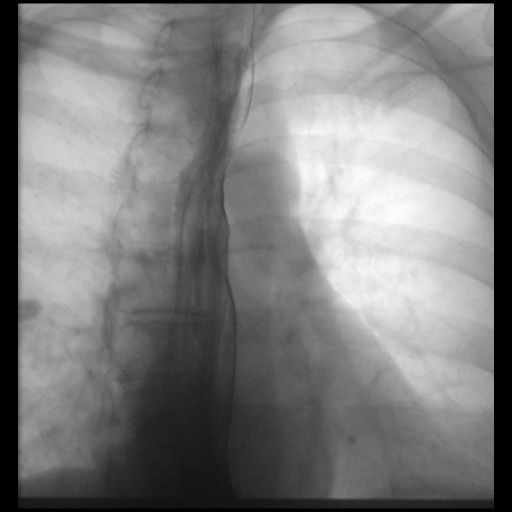

[Series 8: cp_standard · 0.18mm/px · 1 of 1 slices shown (5 of 9)]
[im 1/1]
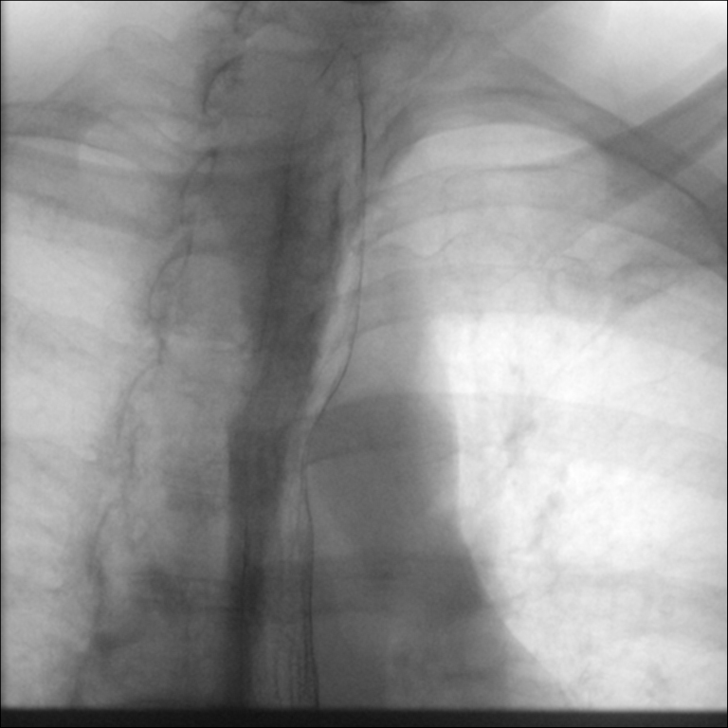

[Series 10: fluoro_barium 2fps_bw · 0.18mm/px · 1 of 2 frames shown (1 of 2)]
[frame 1/2]
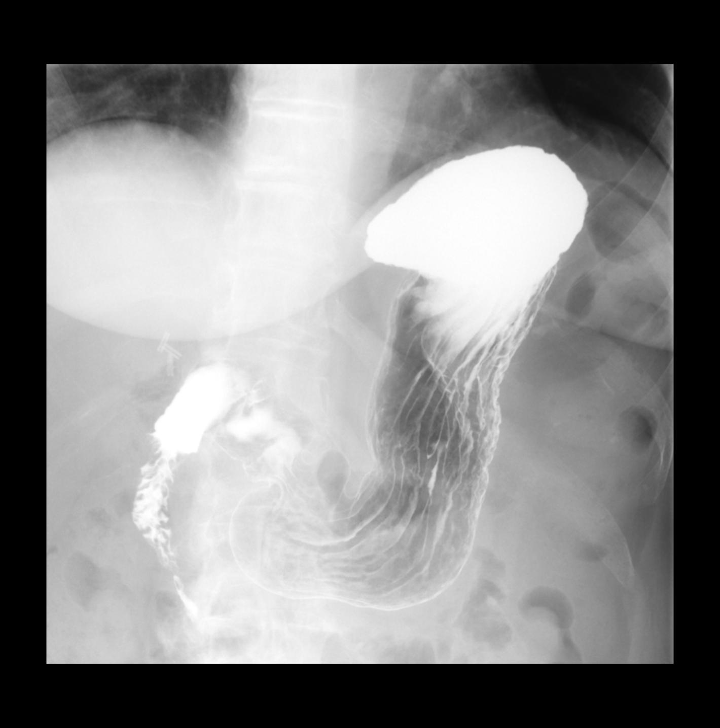

[Series 12: fluoro_barium 2fps_bw · 0.18mm/px · 1 of 1 slices shown (2 of 2)]
[im 1/1]
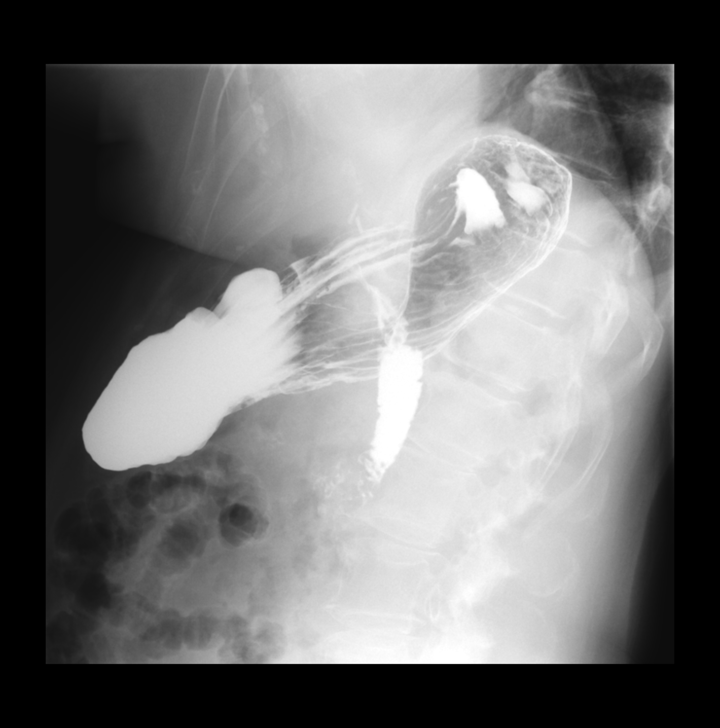

[Series 13: cp_standard · 0.55mm/px · 1 of 22 frames shown (6 of 9)]
[frame 19/22]
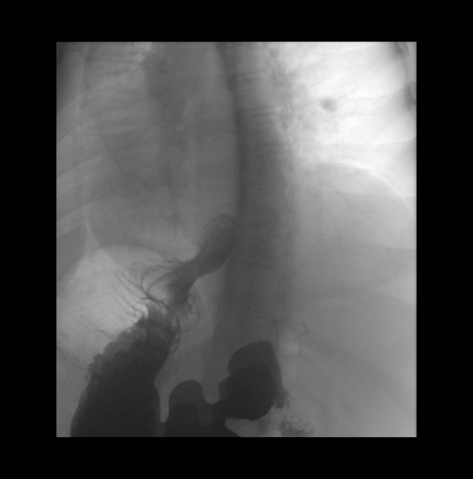

[Series 16: cp_standard · 0.28mm/px · 1 of 1 slices shown (7 of 9)]
[im 1/1]
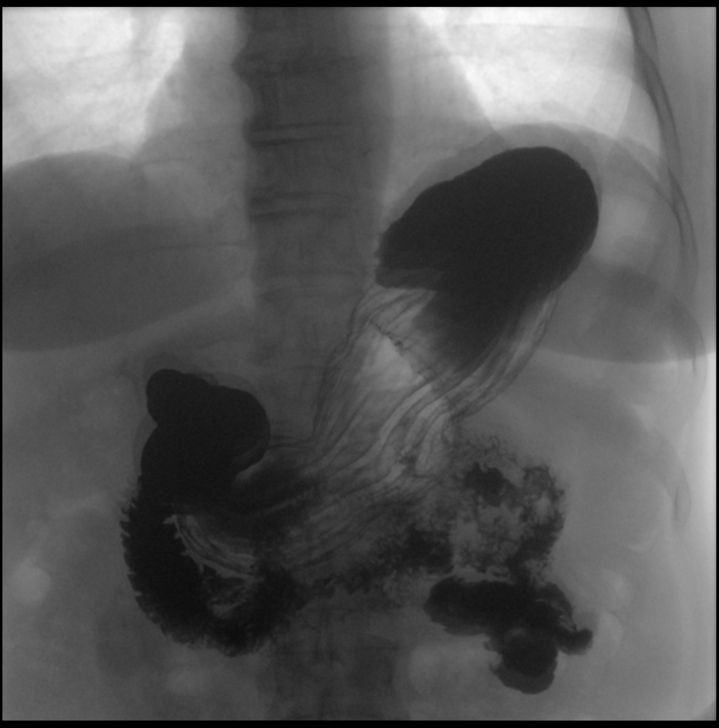

[Series 19: cp_standard · 0.19mm/px · 1 of 1 slices shown (8 of 9)]
[im 1/1]
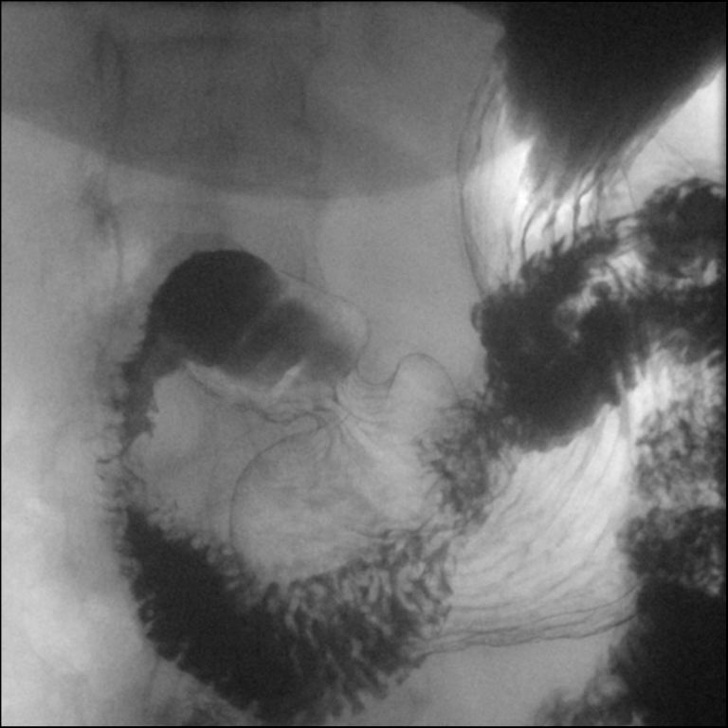

[Series 22: cp_standard · 0.19mm/px · 1 of 1 slices shown (9 of 9)]
[im 1/1]
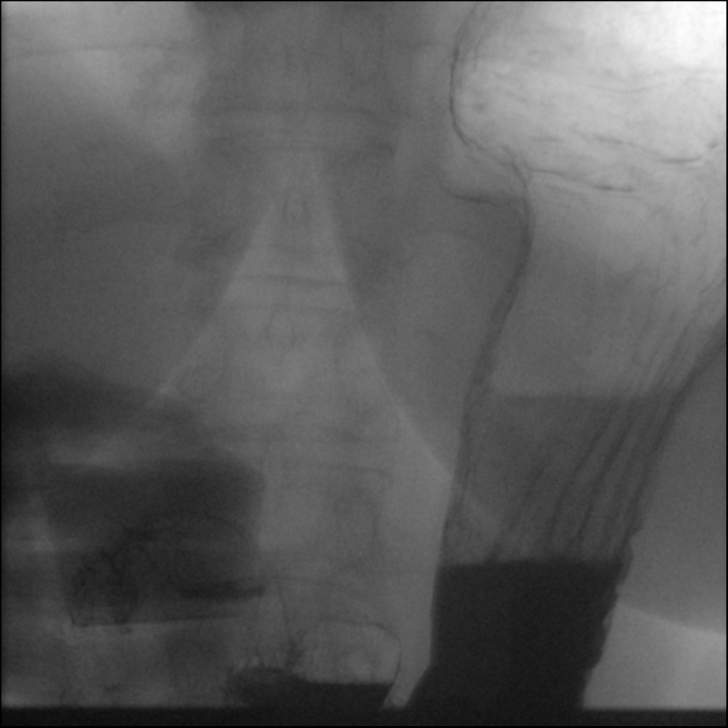

[13 of 24 positions shown; findings below may reference images not displayed]

FINDINGS: KUB shows normal bowel gas pattern. No concerning mass effect.
Cholecystectomy clips.

Lateral pharyngeal imaging is normal. No obstructive process,
aspiration, or concerning stasis. C5-6 ACDF with solid arthrodesis.

A calcified granuloma is noted in the right lung.

Esophagus has normal distensibility and smooth mucosal contour.
There was suspected Candida esophagitis on prior EGD, no esophagitis
changes seen today. When recumbent, motility was normal. No hiatal
hernia or stricture. A 13 mm barium tablet easily traversed the
pharynx and esophagus.

The stomach has normal distensibility and shape. Normal fold
pattern. No hiatal hernia. Normal duodenal C-loop location and fold
pattern.
IMPRESSION: Normal upper GI.

## 2017-10-09 ENCOUNTER — Encounter (HOSPITAL_BASED_OUTPATIENT_CLINIC_OR_DEPARTMENT_OTHER): Payer: Self-pay | Admitting: Emergency Medicine

## 2017-10-09 ENCOUNTER — Emergency Department (HOSPITAL_BASED_OUTPATIENT_CLINIC_OR_DEPARTMENT_OTHER)
Admission: EM | Admit: 2017-10-09 | Discharge: 2017-10-09 | Disposition: A | Discharge status: Home / Self Care | Payer: BLUE CROSS/BLUE SHIELD | Attending: Emergency Medicine | Admitting: Emergency Medicine

## 2017-10-09 ENCOUNTER — Other Ambulatory Visit: Payer: Self-pay

## 2017-10-09 DIAGNOSIS — R197 Diarrhea, unspecified: Secondary | ICD-10-CM | POA: Insufficient documentation

## 2017-10-09 DIAGNOSIS — E860 Dehydration: Secondary | ICD-10-CM

## 2017-10-09 DIAGNOSIS — Z79899 Other long term (current) drug therapy: Secondary | ICD-10-CM | POA: Insufficient documentation

## 2017-10-09 DIAGNOSIS — R110 Nausea: Secondary | ICD-10-CM

## 2017-10-09 DIAGNOSIS — R109 Unspecified abdominal pain: Secondary | ICD-10-CM | POA: Diagnosis not present

## 2017-10-09 DIAGNOSIS — R112 Nausea with vomiting, unspecified: Secondary | ICD-10-CM | POA: Diagnosis not present

## 2017-10-09 DIAGNOSIS — Z85828 Personal history of other malignant neoplasm of skin: Secondary | ICD-10-CM | POA: Insufficient documentation

## 2017-10-09 LAB — COMPREHENSIVE METABOLIC PANEL
ALT: 19 U/L (ref 14–54)
ANION GAP: 14 (ref 5–15)
AST: 26 U/L (ref 15–41)
Albumin: 4.4 g/dL (ref 3.5–5.0)
Alkaline Phosphatase: 33 U/L — ABNORMAL LOW (ref 38–126)
BILIRUBIN TOTAL: 0.8 mg/dL (ref 0.3–1.2)
BUN: 8 mg/dL (ref 6–20)
CO2: 23 mmol/L (ref 22–32)
Calcium: 9.3 mg/dL (ref 8.9–10.3)
Chloride: 102 mmol/L (ref 101–111)
Creatinine, Ser: 0.7 mg/dL (ref 0.44–1.00)
Glucose, Bld: 121 mg/dL — ABNORMAL HIGH (ref 65–99)
POTASSIUM: 3.6 mmol/L (ref 3.5–5.1)
Sodium: 139 mmol/L (ref 135–145)
TOTAL PROTEIN: 7.2 g/dL (ref 6.5–8.1)

## 2017-10-09 LAB — LIPASE, BLOOD: LIPASE: 26 U/L (ref 11–51)

## 2017-10-09 LAB — CBC WITH DIFFERENTIAL/PLATELET
Basophils Absolute: 0 10*3/uL (ref 0.0–0.1)
Basophils Relative: 0 %
Eosinophils Absolute: 0.1 10*3/uL (ref 0.0–0.7)
Eosinophils Relative: 1 %
HEMATOCRIT: 41.4 % (ref 36.0–46.0)
Hemoglobin: 14.7 g/dL (ref 12.0–15.0)
LYMPHS ABS: 1.4 10*3/uL (ref 0.7–4.0)
LYMPHS PCT: 27 %
MCH: 32.9 pg (ref 26.0–34.0)
MCHC: 35.5 g/dL (ref 30.0–36.0)
MCV: 92.6 fL (ref 78.0–100.0)
MONO ABS: 0.5 10*3/uL (ref 0.1–1.0)
MONOS PCT: 10 %
NEUTROS ABS: 3.2 10*3/uL (ref 1.7–7.7)
Neutrophils Relative %: 62 %
Platelets: 220 10*3/uL (ref 150–400)
RBC: 4.47 MIL/uL (ref 3.87–5.11)
RDW: 12.5 % (ref 11.5–15.5)
WBC: 5.3 10*3/uL (ref 4.0–10.5)

## 2017-10-09 LAB — URINALYSIS, MICROSCOPIC (REFLEX): RBC / HPF: NONE SEEN RBC/hpf (ref 0–5)

## 2017-10-09 LAB — URINALYSIS, ROUTINE W REFLEX MICROSCOPIC
Bilirubin Urine: NEGATIVE
Glucose, UA: NEGATIVE mg/dL
Hgb urine dipstick: NEGATIVE
KETONES UR: 15 mg/dL — AB
Nitrite: NEGATIVE
PROTEIN: NEGATIVE mg/dL
Specific Gravity, Urine: <1.005 — ABNORMAL LOW (ref 1.005–1.030)
pH: 7 (ref 5.0–8.0)

## 2017-10-09 MED ORDER — LORAZEPAM 0.5 MG PO TABS: 0.5000 mg | ORAL_TABLET | Freq: Three times a day (TID) | ORAL | 0 refills | Status: DC | PRN

## 2017-10-09 MED ORDER — LORAZEPAM 2 MG/ML IJ SOLN
1.0000 mg | Freq: Once | INTRAMUSCULAR | Status: AC
  Administered 2017-10-09: 1 mg via INTRAVENOUS
  Filled 2017-10-09: qty 1

## 2017-10-09 MED ORDER — SODIUM CHLORIDE 0.9 % IV BOLUS
1000.0000 mL | Freq: Once | INTRAVENOUS | Status: AC
Start: 2017-10-09 — End: 2017-10-09
  Administered 2017-10-09: 1000 mL via INTRAVENOUS

## 2017-10-09 MED ORDER — ONDANSETRON HCL 4 MG/2ML IJ SOLN
4.0000 mg | Freq: Once | INTRAMUSCULAR | Status: AC
  Administered 2017-10-09: 4 mg via INTRAVENOUS
  Filled 2017-10-09: qty 2

## 2017-10-09 MED ORDER — SODIUM CHLORIDE 0.9 % IV BOLUS
1000.0000 mL | Freq: Once | INTRAVENOUS | Status: AC
  Administered 2017-10-09: 1000 mL via INTRAVENOUS

## 2017-10-09 MED FILL — LORazepam 0.5 MG TABS: 0.5 | 4 days supply | Qty: 12 | Fill #0

## 2017-10-09 NOTE — ED Provider Notes (Signed)
[facility] Provider Note   CSN: [id number] Arrival date & time: 10/09/17  9357     History   Chief Complaint Chief Complaint  Patient presents with  . Abdominal Pain  . Nausea  . Diarrhea    HPI Angela Ray is a 67 y.o. female.  The history is provided by the patient, the spouse and medical records.  Diarrhea   This is a new problem. The current episode started more than 2 days ago. The problem occurs 2 to 4 times per day. The problem has not changed since onset.The stool consistency is described as watery. There has been no fever. Associated symptoms include abdominal pain (mild cramp) and vomiting. Pertinent negatives include no chills, no sweats, no headaches, no URI and no cough. She has tried nothing for the symptoms. The treatment provided no relief.  Emesis   This is a new problem. The current episode started more than 2 days ago. The problem occurs 2 to 4 times per day. The problem has not changed since onset.The emesis has an appearance of stomach contents. Associated symptoms include abdominal pain (mild cramp) and diarrhea. Pertinent negatives include no chills, no cough, no fever, no headaches, no sweats and no URI.    Past Medical History:  Diagnosis Date  . Anxiety   . Barrett's esophagus    in the past  . Colon polyp   . Depression   . Dupuytren's contracture of both hands 10/2016   sees Dr.Gramig--[facility]  . Essential tremor 2016   legs, arm,lower jaw  . Female bladder prolapse   . Fibroid   . Fibromyalgia   . GERD (gastroesophageal reflux disease)   . Heat exhaustion   . Herniated disc, cervical    C3  . Hiatal hernia   . History of cardiovascular stress test    Lexiscan Myoview 7/16:  EF 65%, inferolateral defect (prob artifactual), no ischemia  . History of recurrent UTIs   . Hyperlipidemia   . IBS (irritable bowel syndrome)    constipation  . Leg cramps   . Low back pain   . Lumbar herniated disc     L4,L5  . Osteoarthritis   . Osteopenia   . Rheumatoid arthritis, adult ([place]) 2016   both feet  . Skin cancer   . Spinal stenosis    lumbar  . Tremors of nervous system   . Vertigo     Patient Active Problem List   Diagnosis Date Noted  . External hemorrhoid 12/09/2016  . Globus sensation 12/09/2016  . Gas pain 12/09/2016  . Abdominal pain, epigastric 12/09/2016  . Chronic insomnia 12/04/2016  . Essential tremor 12/04/2016  . Fibromyalgia 11/25/2016  . Lumbar stenosis 11/25/2016  . Paresthesia 05/25/2015  . Tremor 05/25/2015  . IBS (irritable bowel syndrome) 02/07/2015  . Recurrent UTI 02/07/2015  . Functional constipation 12/21/2013  . Mixed incontinence 12/16/2012  . Chest pain 06/24/2011  . Anxiety 07/11/2008  . GERD 07/05/2008  . GASTRITIS 07/05/2008  . HIATAL HERNIA 07/05/2008  . GALLSTONES 07/05/2008    Past Surgical History:  Procedure Laterality Date  . ABDOMINAL HYSTERECTOMY  2003   TAH still has ovaries--[name]  . CERVICAL LAMINECTOMY  2002   C5/C6  . CHOLECYSTECTOMY  2008  . fractured ankle Left 10/2014  . ROTATOR CUFF REPAIR Right 2011     OB History    Gravida  0   Para  0   Term  0   Preterm  0   AB  0   Living  0     SAB  0   TAB  0   Ectopic  0   Multiple  0   Live Births  0            Home Medications    Prior to Admission medications   Medication Sig Start Date End Date Taking? Authorizing Provider  amoxicillin-clavulanate (AUGMENTIN) 875-125 MG tablet Take 1 tablet by mouth 2 (two) times daily. 09/18/17   Wendling, Crosby Oyster, DO  busPIRone (BUSPAR) 15 MG tablet Take 15 mg by mouth 2 (two) times daily.  05/16/11   [provider]  Ca Phosphate-Cholecalciferol (CALTRATE GUMMY BITES PO) Take by mouth QID.    [provider]  cetirizine (ZYRTEC) 10 MG tablet Take 10 mg by mouth daily as needed for allergies.     [provider]  clonazePAM (KLONOPIN) 0.5 MG tablet Take 0.5 mg by mouth  3 (three) times daily.  05/25/11   [provider]  Cyanocobalamin (B-12) 2500 MCG TABS Take by mouth daily.    [provider]  cyclobenzaprine (FLEXERIL) 10 MG tablet Take 10 mg by mouth as needed for muscle spasms.    [provider]  diclofenac sodium (VOLTAREN) 1 % GEL Apply 2 g topically as needed (artritisis). 12/04/16   Marcial Pacas, MD  ESTRACE VAGINAL 0.1 MG/GM vaginal cream Apply 1 application topically daily. Apply small amount externally to vaginal area daily 01/31/15   [provider]  fluticasone (FLONASE) 50 MCG/ACT nasal spray Place 2 sprays into both nostrils at bedtime as needed for allergies.  08/29/14   [provider]  gabapentin (NEURONTIN) 300 MG capsule Take 1 capsule (300 mg total) by mouth at bedtime. 12/04/16   Marcial Pacas, MD  hydrocortisone (ANUSOL-HC) 2.5 % rectal cream Place 1 application rectally 2 (two) times daily. 12/09/16   Zehr, Laban Emperor, PA-C  ibuprofen (ADVIL,MOTRIN) 600 MG tablet Take 600 mg by mouth every 6 (six) hours as needed (pain).    [provider]  lidocaine (XYLOCAINE) 5 % ointment Apply daily as needed 12/30/16   [provider]  Multiple Vitamins-Minerals (HAIR/SKIN/NAILS/BIOTIN PO) Take by mouth daily.    [provider]  Niacin (VITAMIN B-3 PO) Take by mouth daily.    [provider]  OVER THE COUNTER MEDICATION Take 1 capsule by mouth 3 (three) times daily. Hydro eye    [provider]  OVER THE COUNTER MEDICATION 50 mg at bedtime. Zyquil    [provider]  pantoprazole (PROTONIX) 40 MG tablet Take 1 tablet (40 mg total) by mouth 2 (two) times daily before a meal. 07/27/17   Zehr, Laban Emperor, PA-C  propranolol (INDERAL) 20 MG tablet Take 1 tablet (20 mg total) by mouth 2 (two) times daily. 12/04/16   Marcial Pacas, MD  Sertraline HCl (ZOLOFT PO) Take 20 mg by mouth daily.    [provider]  traMADol (ULTRAM) 50 MG tablet Take 1 tablet (50 mg total) by  mouth every 8 (eight) hours as needed. 11/10/16   Copland, Gay Filler, MD  trimethoprim (TRIMPEX) 100 MG tablet Take 1 tablet by mouth daily. 01/31/15   [provider]  tolterodine (DETROL LA) 4 MG 24 hr capsule Take 4 mg by mouth daily. Needs name brand only--generic does not work for patient.  12/16/12  [provider]    Family History Family History  Problem Relation Age of Onset  . Coronary  artery disease Father 77       CABG 2002, followd by Dr, Ron Parker  . Prostate cancer Father   . Bladder Cancer Father   . Skin cancer Father   . Diabetes Father 51       DM type 2  . Atrial fibrillation Mother   . Multiple myeloma Mother   . Skin cancer Mother   . Coronary artery disease Mother   . Irritable bowel syndrome Mother   . Cancer Mother        multiple myeloma  . Diabetes Maternal Grandmother   . Hypertension Maternal Grandmother   . Diabetes Maternal Grandfather   . Hypertension Maternal Grandfather   . Osteoarthritis Paternal Grandmother   . Cancer Maternal Aunt        bone cancer  . Colon cancer Neg Hx     Social History Social History   Tobacco Use  . Smoking status: Never Smoker  . Smokeless tobacco: Never Used  Substance Use Topics  . Alcohol use: No    Alcohol/week: 0.0 oz    Comment: occ glass of wine  . Drug use: No     Allergies   Aciphex [rabeprazole sodium]; Bupropion hcl; Cephalexin; Codeine; Codeine phosphate; Esomeprazole magnesium; Hydrocodone-acetaminophen; Oxycodone-acetaminophen; Sertraline hcl; and Doxycycline hyclate   Review of Systems Review of Systems  Constitutional: Positive for fatigue. Negative for chills, diaphoresis and fever.  HENT: Negative for congestion and rhinorrhea.   Eyes: Negative for photophobia and visual disturbance.  Respiratory: Negative for cough, chest tightness, shortness of breath and wheezing.   Cardiovascular: Negative for chest pain and palpitations.  Gastrointestinal: Positive for abdominal pain  (mild cramp), diarrhea, nausea and vomiting. Negative for constipation.  Genitourinary: Negative for difficulty urinating, flank pain, frequency, hematuria, pelvic pain and urgency.  Musculoskeletal: Negative for back pain, neck pain and neck stiffness.  Skin: Negative for rash and wound.  Neurological: Negative for light-headedness, numbness and headaches.  Psychiatric/Behavioral: Negative for agitation.  All other systems reviewed and are negative.    Physical Exam Updated Vital Signs BP 117/85 (BP Location: Left Arm)   Pulse 77   Temp 99 F (37.2 C) (Oral)   Resp 14   Ht 5' 10.5" (1.791 m)   Wt 78.5 kg (173 lb)   LMP 02/09/2002 (Approximate)   SpO2 100%   BMI 24.47 kg/m   Physical Exam  Constitutional: She is oriented to person, place, and time. She appears well-developed and well-nourished.  Non-toxic appearance. She does not appear ill. No distress.  HENT:  Head: Normocephalic and atraumatic.  Nose: Nose normal.  Mouth/Throat: Oropharynx is clear and moist. No oropharyngeal exudate.  Eyes: Pupils are equal, round, and reactive to light. Conjunctivae and EOM are normal. No scleral icterus.  Neck: Neck supple.  Cardiovascular: Normal rate and intact distal pulses.  No murmur heard. Pulmonary/Chest: Effort normal and breath sounds normal. No respiratory distress. She has no wheezes. She exhibits no tenderness.  Abdominal: Soft. Bowel sounds are normal. She exhibits no distension. There is no tenderness.  Musculoskeletal: She exhibits no edema or tenderness.  Lymphadenopathy:    She has no cervical adenopathy.  Neurological: She is alert and oriented to person, place, and time. No sensory deficit. She exhibits normal muscle tone.  Skin: No rash noted. She is not diaphoretic. No erythema.  Nursing note and vitals reviewed.    ED Treatments / Results  Labs (all labs ordered are listed, but only abnormal results are displayed) Labs Reviewed  COMPREHENSIVE METABOLIC  PANEL - Abnormal; Notable for the following components:      Result Value   Glucose, Bld 121 (*)    Alkaline Phosphatase 33 (*)    All other components within normal limits  URINALYSIS, ROUTINE W REFLEX MICROSCOPIC - Abnormal; Notable for the following components:   Color, Urine STRAW (*)    Specific Gravity, Urine <1.005 (*)    Ketones, ur 15 (*)    Leukocytes, UA TRACE (*)    All other components within normal limits  URINALYSIS, MICROSCOPIC (REFLEX) - Abnormal; Notable for the following components:   Bacteria, UA MANY (*)    All other components within normal limits  URINE CULTURE  CBC WITH DIFFERENTIAL/PLATELET  LIPASE, BLOOD    EKG EKG Interpretation  Date/Time:  Friday Oct 09 2017 09:13:40 EDT Ventricular Rate:  72 PR Interval:    QRS Duration: 102 QT Interval:  443 QTC Calculation: 485 R Axis:   102 Text Interpretation:  Sinus rhythm Right axis deviation Low voltage, extremity and precordial leads Minimal ST depression, inferior leads Baseline wander in lead(s) II III aVF V2 When comapred to prior, no significant changes seen.  No STEMI Confirmed by Antony Blackbird [phone]) on 10/09/2017 10:22:34 AM   Radiology No results found.  Procedures Procedures (including critical care time)  Medications Ordered in ED Medications  ondansetron (ZOFRAN) injection 4 mg (4 mg Intravenous Given 10/09/17 0646)  ondansetron (ZOFRAN) injection 4 mg (4 mg Intravenous Given 10/09/17 0745)  sodium chloride 0.9 % bolus 1,000 mL (0 mLs Intravenous Stopped 10/09/17 0836)  LORazepam (ATIVAN) injection 1 mg (1 mg Intravenous Given 10/09/17 0916)  sodium chloride 0.9 % bolus 1,000 mL (0 mLs Intravenous Stopped 10/09/17 1017)     Initial Impression / Assessment and Plan / ED Course  I have reviewed the triage vital signs and the nursing notes.  Pertinent labs & imaging results that were available during my care of the patient were reviewed by me and considered in my medical decision making (see  chart for details).     Angela Ray is a 67 y.o. female with a past medical history significant for anxiety, depression, hyper myalgia, GERD, and irritable bowel syndrome who presents with nausea, vomiting, diarrhea, fatigue, and mild abdominal cramping.  Patient reports that her symptoms been ongoing for the last 3 days.  She also reports that she thinks she is having panic attacks due to the nausea and vomiting.  She says that she has not been able to take her medicines at home or eat or drink.  She reports that she had feelings of heat exhaustion leading up to the GI symptoms and thought she might be very dehydrated.  She denies fevers, chills, chest pain, or new shortness of breath.  She denies any traumatic injuries.  She denies any urinary symptoms at all.  She denies any constipation but reports loose watery diarrhea.  She is unsure of sick contacts.  She is feeling very anxious.  On exam, abdomen is nontender.  Lungs are clear and chest is nontender.  No significant edema was seen.  Patient appeared well but was nauseous.  Given her history and exam I am concerned about severe dehydration given her feeling dehydrated before her diarrhea and vomiting started.  Patient will be given fluids and have work-up to look for electrolyte imbalances.  Laboratory testing showed leukocytes and bacteria in the urine however patient vehemently denies any urinary symptoms.  Culture was sent but will not treat for UTI at  this time.  Patient did have ketones likely reflective of the dehydration.  Other laboratory testing was reassuring.  Patient felt better after Ativan for nausea and anxiety.  Patient felt better after fluids.  Patient was able to tolerate p.o. as well as her oral medications and we feel she is safe for discharge home to continue her outpatient regimen of medications and she is going to see her PCP on Monday.  Patient given prescription for Ativan a low-dose oral amount to take if she is  having increased anxiety/panic attack as well as the nausea.  Suspect viral gastroenteritis as the cause of her symptoms.    Patient and husband agreed with plan of care and she was feeling better.  Patient discharged in good condition with improved symptoms.  Patient's last blood pressure when I was evaluating her was 536 systolic.  She reports this is normal for her.    Final Clinical Impressions(s) / ED Diagnoses   Final diagnoses:  Dehydration  Nausea  Diarrhea, unspecified type    ED Discharge Orders        Ordered    LORazepam (ATIVAN) 0.5 MG tablet  3 times daily PRN     10/09/17 1139     Clinical Impression: 1. Dehydration   2. Nausea   3. Diarrhea, unspecified type     Disposition: Discharge  Condition: Good  I have discussed the results, Dx and Tx plan with the pt(& family if present). He/she/they expressed understanding and agree(s) with the plan. Discharge instructions discussed at great length. Strict return precautions discussed and pt &/or family have verbalized understanding of the instructions. No further questions at time of discharge.    New Prescriptions   LORAZEPAM (ATIVAN) 0.5 MG TABLET    Take 1 tablet (0.5 mg total) by mouth 3 (three) times daily as needed for anxiety (and nausea).    Follow Up: Darreld Mclean, MD [address] [place] [address] [phone]     [facility] [address] [id number] CW [facility] [place] [phone]       Tegeler, Gwenyth Allegra, MD 10/09/17 1215

## 2017-10-09 NOTE — ED Notes (Signed)
Pt on auto VS  

## 2017-10-09 NOTE — ED Triage Notes (Signed)
Pt presents with c/o N/V/D since Wednesday

## 2017-10-09 NOTE — ED Notes (Signed)
MD talked to patient and husband and informed them that it is okay to take her home meds.  Patient took her morning meds without any problem.

## 2017-10-09 NOTE — Discharge Instructions (Signed)
Your work-up today showed evidence of dehydration.  This is likely secondary to having your diarrhea and decreased oral intake with nausea and vomiting.  Please follow-up with your primary doctor on Monday and use the medicines to help with your nausea.  If any symptoms change or worsen, please return to the nearest emergency department.

## 2017-10-09 NOTE — ED Notes (Signed)
ED Provider at bedside. 

## 2017-10-10 LAB — URINE CULTURE

## 2017-10-10 NOTE — Progress Notes (Addendum)
[facility] [address], [place], [place] [address] [phone]  Date:  10/12/2017   Name:  Angela Ray   DOB:  Oct 19, 1950   MRN:  [id number]  PCP:  Darreld Mclean, MD    Chief Complaint: Hyperlipidemia (6 month check) and Dehydration (seen in ER last week, received fluid and anti nausea)   History of Present Illness:  Angela Ray is a 67 y.o. very pleasant female patient who presents with the following:  Follow-up visit today, but also recent ER visit as below Last seen here in December:  She loves living at [place]- she and her husband moved there from [place]. They did a lot of gardening and outdoor work, but as her health became not as good they downsized. She still does some container gardening. The move is an adjustment but overall is a good thing Her dog passed away a few months ago, and her elderly mother passed away a few years ago. She and her husband went away to the beach just the two of them this summerwhich they had not been able to do in years.  She notes TMJ for years, GERD with hiatal hernia She has had cervical spine surgery in the past, and has herniated discs in her lumbar spine as well Shewas told that she had "full blown"RA in the past- however she got a 2nd opinion from [place] and was found to have mild RA and also OA- she is not on any immunomodulator right now/ However since [employer] has retired she needs to seek a new rheumatologist. She has noted nodules along the flexor tendon of the long finger- in the palm of her hand- on her bilateral hands for a few months.  She also has essential tremor- inderaldoes help her She has"osteopenia inmyhips"per her report  She had hysterectomy due to fibroids. She still has her ovaries   She uses gabapentin to help prevent cramps in her bilateral legs- mostly has her cramping at night She does use omeprazole for her GERD.  Also has history of hiatal hernia. Notes that she has felt "a gas bubble" in her stomach for the last several months and wonders what she should do She takes klonopin, paxil ,buspar for her panic  She has not wanted to go on cholesterol medication due to family history of reaction to statin   Lipids:    Component Value Date/Time   CHOL 248 (H) 03/06/2017 1106   TRIG 131 03/06/2017 1106   HDL 49 03/06/2017 1106   VLDL 26 02/22/2016 1347   CHOLHDL 5.1 (H) 03/06/2017 1106   CHOLHDL 4.5 02/22/2016 1347   History of lumbar stenosis, GERD, anxiety   This past weekend she was in the ER with dehydration/ nausea and vomiting Note from 5/31 Angela Ray is a 67 y.o. female with a past medical history significant for anxiety, depression, hyper myalgia, GERD, and irritable bowel syndrome who presents with nausea, vomiting, diarrhea, fatigue, and mild abdominal cramping.  Patient reports that her symptoms been ongoing for the last 3 days.  She also reports that she thinks she is having panic attacks due to the nausea and vomiting.  She says that she has not been able to take her medicines at home or eat or drink.  She reports that she had feelings of heat exhaustion leading up to the GI symptoms and thought she might be very dehydrated.  She denies fevers, chills, chest  pain, or new shortness of breath.  She denies any traumatic injuries.  She denies any urinary symptoms at all.  She denies any constipation but reports loose watery diarrhea.  She is unsure of sick contacts.  She is feeling very anxious.  Given her history and exam I am concerned about severe dehydration given her feeling dehydrated before her diarrhea and vomiting started.  Patient will be given fluids and have work-up to look for electrolyte imbalances. Laboratory testing showed leukocytes and bacteria in the urine however patient vehemently denies any urinary symptoms.  Culture was sent but will not treat for UTI at this time.   Patient did have ketones likely reflective of the dehydration.  Other laboratory testing was reassuring. Patient felt better after Ativan for nausea and anxiety.  Patient felt better after fluids. Patient was able to tolerate p.o. as well as her oral medications and we feel she is safe for discharge home to continue her outpatient regimen of medications and she is going to see her PCP on Monday.  Patient given prescription for Ativan a low-dose oral amount to take if she is having increased anxiety/panic attack as well as the nausea.  Suspect viral gastroenteritis as the cause of her symptoms.   Patient and husband agreed with plan of care and she was feeling better.  Patient discharged in good condition with improved symptoms. Patient's last blood pressure when I was evaluating her was 203 systolic.  She reports this is normal for her.  She needs RF of her propranolol that she uses BID for tremor She uses phenergan for nausea and would like a RF of this as well She is not using zofran at home although she did get some in the ER on 5/31  Pt reports that she is feeling a bit weak and lightheaded- she is fasting. Gave her some juice and a cliff bar and she felt much better  Wt Readings from Last 3 Encounters:  10/09/17 173 lb (78.5 kg)  09/18/17 175 lb 1 oz (79.4 kg)  04/13/17 184 lb 6.4 oz (83.6 kg)   She is trying to lose weight- she has changed her diet some, she is eating lean cuisine and is watching her calories well  BP Readings from Last 3 Encounters:  10/12/17 (!) 92/52  10/09/17 (!) 98/54  09/18/17 108/72     Patient Active Problem List   Diagnosis Date Noted  . External hemorrhoid 12/09/2016  . Globus sensation 12/09/2016  . Gas pain 12/09/2016  . Abdominal pain, epigastric 12/09/2016  . Chronic insomnia 12/04/2016  . Essential tremor 12/04/2016  . Fibromyalgia 11/25/2016  . Lumbar stenosis 11/25/2016  . Paresthesia 05/25/2015  . Tremor 05/25/2015  . IBS (irritable bowel  syndrome) 02/07/2015  . Recurrent UTI 02/07/2015  . Functional constipation 12/21/2013  . Mixed incontinence 12/16/2012  . Chest pain 06/24/2011  . Anxiety 07/11/2008  . GERD 07/05/2008  . GASTRITIS 07/05/2008  . HIATAL HERNIA 07/05/2008  . GALLSTONES 07/05/2008    Past Medical History:  Diagnosis Date  . Anxiety   . Barrett's esophagus    in the past  . Colon polyp   . Depression   . Dupuytren's contracture of both hands 10/2016   sees Dr.Gramig--[facility]  . Essential tremor 2016   legs, arm,lower jaw  . Female bladder prolapse   . Fibroid   . Fibromyalgia   . GERD (gastroesophageal reflux disease)   . Heat exhaustion   . Herniated disc, cervical    C3  .  Hiatal hernia   . History of cardiovascular stress test    Lexiscan Myoview 7/16:  EF 65%, inferolateral defect (prob artifactual), no ischemia  . History of recurrent UTIs   . Hyperlipidemia   . IBS (irritable bowel syndrome)    constipation  . Leg cramps   . Low back pain   . Lumbar herniated disc    L4,L5  . Osteoarthritis   . Osteopenia   . Rheumatoid arthritis, adult ([place]) 2016   both feet  . Skin cancer   . Spinal stenosis    lumbar  . Tremors of nervous system   . Vertigo     Past Surgical History:  Procedure Laterality Date  . ABDOMINAL HYSTERECTOMY  2003   TAH still has ovaries--[name]  . CERVICAL LAMINECTOMY  2002   C5/C6  . CHOLECYSTECTOMY  2008  . fractured ankle Left 10/2014  . ROTATOR CUFF REPAIR Right 2011    Social History   Tobacco Use  . Smoking status: Never Smoker  . Smokeless tobacco: Never Used  Substance Use Topics  . Alcohol use: No    Alcohol/week: 0.0 oz    Comment: occ glass of wine  . Drug use: No    Family History  Problem Relation Age of Onset  . Coronary artery disease Father 51       CABG 2002, followd by Dr, Ron Parker  . Prostate cancer Father   . Bladder Cancer Father   . Skin cancer Father   . Diabetes Father 17       DM type 2  . Atrial  fibrillation Mother   . Multiple myeloma Mother   . Skin cancer Mother   . Coronary artery disease Mother   . Irritable bowel syndrome Mother   . Cancer Mother        multiple myeloma  . Diabetes Maternal Grandmother   . Hypertension Maternal Grandmother   . Diabetes Maternal Grandfather   . Hypertension Maternal Grandfather   . Osteoarthritis Paternal Grandmother   . Cancer Maternal Aunt        bone cancer  . Colon cancer Neg Hx     Allergies  Allergen Reactions  . Aciphex [Rabeprazole Sodium] Other (See Comments)    REACTION: "FELT LIKE INDIGESTION"  . Bupropion Hcl Other (See Comments)    REACTION: "DIDN'T MAKE ME FEEL RIGHT IN THE HEAD"  . Cephalexin Nausea Only and Other (See Comments)    REACTION: "GAVE YEAST INFECTION"  . Codeine Other (See Comments)    REACTION: "SEVERE NAUSEA, HEART PALPITATION, PASSING OUT, HEART ATTACK LIKE SYMPTOMS"  . Codeine Phosphate Other (See Comments)    REACTION: "SEVERE NAUSEA, HEART PALPITATION, PASSING OUT, HEART ATTACK LIKE SYMPTOMS"  . Esomeprazole Magnesium Other (See Comments)    Nexium=REACTION: " CAUSE TASTE BUDS TO FALL OUT"  . Hydrocodone-Acetaminophen Other (See Comments)    REACTION: "SEVERE NAUSEA, HEART PALPITATION, PASSING OUT, HEART ATTACK LIKE SYMPTOMS"  . Oxycodone-Acetaminophen Other (See Comments)    REACTION: "SEVERE NAUSEA, HEART PALPITATION, PASSING OUT, HEART ATTACK LIKE SYMPTOMS"  . Sertraline Hcl Other (See Comments)    REACTION: "DIDN'T MAKE ME FEEL RIGHT IN THE HEAD"  . Doxycycline Hyclate Nausea Only    Medication list has been reviewed and updated.  Current Outpatient Medications on File Prior to Visit  Medication Sig Dispense Refill  . busPIRone (BUSPAR) 15 MG tablet Take 15 mg by mouth 2 (two) times daily.     . Ca Phosphate-Cholecalciferol (CALTRATE GUMMY BITES PO) Take  by mouth QID.    Marland Kitchen cetirizine (ZYRTEC) 10 MG tablet Take 10 mg by mouth daily as needed for allergies.     . clonazePAM (KLONOPIN)  0.5 MG tablet Take 0.5 mg by mouth 4 (four) times daily.     . Cyanocobalamin (B-12) 2500 MCG TABS Take by mouth daily.    . cyclobenzaprine (FLEXERIL) 10 MG tablet Take 10 mg by mouth as needed for muscle spasms.    . diclofenac sodium (VOLTAREN) 1 % GEL Apply 2 g topically as needed (artritisis). 100 g 11  . ESTRACE VAGINAL 0.1 MG/GM vaginal cream Apply 1 application topically daily. Apply small amount externally to vaginal area daily    . fluticasone (FLONASE) 50 MCG/ACT nasal spray Place 2 sprays into both nostrils at bedtime as needed for allergies.   4  . gabapentin (NEURONTIN) 300 MG capsule Take 1 capsule (300 mg total) by mouth at bedtime. 90 capsule 4  . hydrocortisone (ANUSOL-HC) 2.5 % rectal cream Place 1 application rectally 2 (two) times daily. 30 g 1  . ibuprofen (ADVIL,MOTRIN) 600 MG tablet Take 600 mg by mouth every 6 (six) hours as needed (pain).    Marland Kitchen lidocaine (XYLOCAINE) 5 % ointment Apply daily as needed    . LORazepam (ATIVAN) 0.5 MG tablet Take 1 tablet (0.5 mg total) by mouth 3 (three) times daily as needed for anxiety (and nausea). 12 tablet 0  . Multiple Vitamins-Minerals (HAIR/SKIN/NAILS/BIOTIN PO) Take by mouth daily.    . Niacin (VITAMIN B-3 PO) Take by mouth daily.    Marland Kitchen OVER THE COUNTER MEDICATION Take 1 capsule by mouth 3 (three) times daily. Hydro eye    . OVER THE COUNTER MEDICATION 50 mg at bedtime. Zyquil    . pantoprazole (PROTONIX) 40 MG tablet Take 1 tablet (40 mg total) by mouth 2 (two) times daily before a meal. 180 tablet 3  . PARoxetine (PAXIL) 10 MG tablet Take 20 mg by mouth 2 (two) times daily.  0  . Sertraline HCl (ZOLOFT PO) Take 20 mg by mouth daily.    . traMADol (ULTRAM) 50 MG tablet Take 1 tablet (50 mg total) by mouth every 8 (eight) hours as needed. 30 tablet 1  . trimethoprim (TRIMPEX) 100 MG tablet Take 1 tablet by mouth daily.    . [DISCONTINUED] tolterodine (DETROL LA) 4 MG 24 hr capsule Take 4 mg by mouth daily. Needs name brand  only--generic does not work for patient.     No current facility-administered medications on file prior to visit.     Review of Systems: As per HPI- otherwise negative.  No fever or chills Her belly feels a bit sore all over, but she is able to eat again and is not having any vomiting or diarrhea She is monitoring her temp and it has been normal   Physical Examination: Vitals:   10/12/17 0858  BP: (!) 92/52  Pulse: 66  Resp: 16  SpO2: 98%   There were no vitals filed for this visit. There is no height or weight on file to calculate BMI. Ideal Body Weight:    GEN: WDWN, NAD, Non-toxic, A & O x 3, looks well, normal weight HEENT: Atraumatic, Normocephalic. Neck supple. No masses, No LAD.  Bilateral TM wnl, oropharynx normal.  PEERL,EOMI.   Ears and Nose: No external deformity. CV: RRR, No M/G/R. No JVD. No thrill. No extra heart sounds. PULM: CTA B, no wheezes, crackles, rhonchi. No retractions. No resp. distress. No accessory muscle use. ABD:  S, ND, +BS. No rebound. No HSM.  Pt endorses that her belly is "tender" slightly all over, but no apparent pain on exam  EXTR: No c/c/e NEURO Normal gait.  PSYCH: Normally interactive. Conversant. Not depressed or anxious appearing.  Calm demeanor.  Looks well, tall build  Assessment and Plan: Dyslipidemia - Plan: Lipid panel  Essential tremor - Plan: propranolol (INDERAL) 20 MG tablet  Nausea and vomiting, intractability of vomiting not specified, unspecified vomiting type - Plan: promethazine (PHENERGAN) 25 MG tablet  Dehydration - Plan: CBC  Following up on dyslipidemia today. Pt came in fasting but was given a snack here as she was feeling lightheaded. Today is Monday- on Friday she was in the ER with 3 days of nausea, vomiting- thought to be dehydrated, she was treated with IVF and her labs all looked ok She is feeling much better now but still a bit weak. Encouraged her to drink plenty of fluids and liberalize her salt intake  for several days, until feeling better She is on inderal at a low dose for her tremor- as she uses this chronically and her dose is low to begin with we will continue this Refilled her prometazine which she uses prn Will plan further follow- up pending labs.   Signed Lamar Blinks, MD  Received her labs 6/4 Results for orders placed or performed in visit on 10/12/17  Lipid panel  Result Value Ref Range   Cholesterol 247 (H) 0 - 200 mg/dL   Triglycerides 119.0 0.0 - 149.0 mg/dL   HDL 38.00 (L) >39.00 mg/dL   VLDL 23.8 0.0 - 40.0 mg/dL   LDL Cholesterol 185 (H) 0 - 99 mg/dL   Total CHOL/HDL Ratio 7    NonHDL 209.27   CBC  Result Value Ref Range   WBC 4.7 4.0 - 10.5 K/uL   RBC 4.26 3.87 - 5.11 Mil/uL   Platelets 203.0 150.0 - 400.0 K/uL   Hemoglobin 13.6 12.0 - 15.0 g/dL   HCT 40.0 36.0 - 46.0 %   MCV 93.9 78.0 - 100.0 fl   MCHC 34.1 30.0 - 36.0 g/dL   RDW 13.0 11.5 - 15.5 %   Message to pt  I hope that you are continuing to feel better! Blood counts are quite normal  Your cholesterol is not great- due to a poor ratio between your total and HDL ("good") cholesterol numbers.  It is certainly up to you but I would encourage you to try taking a statin and see how you do.  You may tolerate it fine!  I do think that a statin may help to reduce your risk of cardiovascular disease later on.  Let me know if you would like to to this

## 2017-10-12 ENCOUNTER — Encounter: Payer: Self-pay | Admitting: Family Medicine

## 2017-10-12 ENCOUNTER — Ambulatory Visit: Payer: BLUE CROSS/BLUE SHIELD | Admitting: Family Medicine

## 2017-10-12 VITALS — BP 92/52 | HR 66 | Resp 16

## 2017-10-12 DIAGNOSIS — E785 Hyperlipidemia, unspecified: Secondary | ICD-10-CM | POA: Diagnosis not present

## 2017-10-12 DIAGNOSIS — G250 Essential tremor: Secondary | ICD-10-CM

## 2017-10-12 DIAGNOSIS — Z09 Encounter for follow-up examination after completed treatment for conditions other than malignant neoplasm: Secondary | ICD-10-CM

## 2017-10-12 DIAGNOSIS — E860 Dehydration: Secondary | ICD-10-CM

## 2017-10-12 DIAGNOSIS — R112 Nausea with vomiting, unspecified: Secondary | ICD-10-CM | POA: Diagnosis not present

## 2017-10-12 LAB — LIPID PANEL
CHOL/HDL RATIO: 7
CHOLESTEROL: 247 mg/dL — AB (ref 0–200)
HDL: 38 mg/dL — ABNORMAL LOW (ref >39.00)
LDL CALC: 185 mg/dL — AB (ref 0–99)
NONHDL: 209.27
Triglycerides: 119 mg/dL (ref 0.0–149.0)
VLDL: 23.8 mg/dL (ref 0.0–40.0)

## 2017-10-12 LAB — CBC
HEMATOCRIT: 40 % (ref 36.0–46.0)
Hemoglobin: 13.6 g/dL (ref 12.0–15.0)
MCHC: 34.1 g/dL (ref 30.0–36.0)
MCV: 93.9 fl (ref 78.0–100.0)
Platelets: 203 10*3/uL (ref 150.0–400.0)
RBC: 4.26 Mil/uL (ref 3.87–5.11)
RDW: 13 % (ref 11.5–15.5)
WBC: 4.7 10*3/uL (ref 4.0–10.5)

## 2017-10-12 MED ORDER — PROMETHAZINE HCL 25 MG PO TABS: 25.0000 mg | ORAL_TABLET | Freq: Four times a day (QID) | ORAL | 0 refills | Status: DC | PRN

## 2017-10-12 MED ORDER — PROPRANOLOL HCL 20 MG PO TABS: 20.0000 mg | ORAL_TABLET | Freq: Two times a day (BID) | ORAL | 3 refills | Status: DC

## 2017-10-12 NOTE — Patient Instructions (Addendum)
It was good to see you today I will be in touch with your cholesterol asap. Take care and let me know if you are not doing ok as far as your lightheadedness. Please eat and drink as you are able, and increase salt intake for a few days.

## 2017-10-13 ENCOUNTER — Encounter: Payer: Self-pay | Admitting: Family Medicine

## 2017-10-22 ENCOUNTER — Encounter: Payer: Self-pay | Admitting: Family Medicine

## 2017-10-26 MED FILL — busPIRone HCL 30 MG TABS: 30 | 30 days supply | Qty: 30 | Fill #5

## 2017-11-23 MED FILL — busPIRone HCL 30 MG TABS: 30 | 30 days supply | Qty: 30 | Fill #6

## 2017-12-03 DIAGNOSIS — Z1231 Encounter for screening mammogram for malignant neoplasm of breast: Secondary | ICD-10-CM | POA: Diagnosis not present

## 2017-12-20 MED FILL — busPIRone HCL 30 MG TABS: 30 | 30 days supply | Qty: 30 | Fill #7

## 2017-12-22 DIAGNOSIS — H2513 Age-related nuclear cataract, bilateral: Secondary | ICD-10-CM | POA: Diagnosis not present

## 2017-12-22 DIAGNOSIS — H04123 Dry eye syndrome of bilateral lacrimal glands: Secondary | ICD-10-CM | POA: Diagnosis not present

## 2017-12-24 ENCOUNTER — Encounter: Payer: Self-pay | Admitting: Obstetrics and Gynecology

## 2017-12-31 DIAGNOSIS — R399 Unspecified symptoms and signs involving the genitourinary system: Secondary | ICD-10-CM | POA: Diagnosis not present

## 2017-12-31 DIAGNOSIS — N390 Urinary tract infection, site not specified: Secondary | ICD-10-CM | POA: Diagnosis not present

## 2017-12-31 DIAGNOSIS — N952 Postmenopausal atrophic vaginitis: Secondary | ICD-10-CM | POA: Diagnosis not present

## 2018-01-18 MED FILL — busPIRone HCL 30 MG TABS: 30 | 30 days supply | Qty: 30 | Fill #8

## 2018-02-23 ENCOUNTER — Other Ambulatory Visit: Payer: Self-pay | Admitting: Neurology

## 2018-02-23 MED FILL — busPIRone HCL 30 MG TABS: 30 | 90 days supply | Qty: 90 | Fill #0

## 2018-03-01 ENCOUNTER — Ambulatory Visit (INDEPENDENT_AMBULATORY_CARE_PROVIDER_SITE_OTHER): Payer: Medicare Other

## 2018-03-01 DIAGNOSIS — Z23 Encounter for immunization: Secondary | ICD-10-CM | POA: Diagnosis not present

## 2018-03-25 NOTE — Progress Notes (Signed)
67 y.o. [name] Married Caucasian female here for annual exam.  Bladder function is improved.  Not taking any specific meds for this.  [name] prescribing vaginal estrogen.  Saw Dr. [name] about her normal BMD but hx of fracture.  No treatment recommended per patient.   Having some anxiety related to her husband's illness. Sees her psychiatrist.  May do a new therapy for PTSD.  Husband with advanced prostate cancer.    Getting a new puppy.   PCP:  [name], MD   Patient's last menstrual period was 02/09/2002 (approximate).           Sexually active: No.  The current method of family planning is status post hysterectomy.    Exercising: No.   Smoker:  no  Health Maintenance: Pap: 11-20-10 Neg History of abnormal Pap:  no MMG: 12-03-17 3D Neg/density B/BiRads2 Colonoscopy: 04/04/15 Tubular Adenoma with Dr.[name];repeat in 3years BMD:  12-01-16  Result :Normal--Hx Osteoporosis TDaP:  05/2014 Gardasil:   no HIV: 02-22-16 NR Hep C: 02-22-16 Neg Screening Labs:  Hb today: PCP. Did flu vaccine.   reports that she has never smoked. She has never used smokeless tobacco. She reports that she does not drink alcohol or use drugs.  Past Medical History:  Diagnosis Date  . Anxiety   . Barrett's esophagus    in the past  . Colon polyp   . Depression   . Dupuytren's contracture of both hands 10/2016   sees Dr.Gramig--[facility]  . Essential tremor 2016   legs, arm,lower jaw  . Female bladder prolapse   . Fibroid   . Fibromyalgia   . GERD (gastroesophageal reflux disease)   . Heat exhaustion   . Herniated disc, cervical    C3  . Hiatal hernia   . History of cardiovascular stress test    Lexiscan Myoview 7/16:  EF 65%, inferolateral defect (prob artifactual), no ischemia  . History of recurrent UTIs   . Hyperlipidemia   . IBS (irritable bowel syndrome)    constipation  . Leg cramps   . Low back pain   . Lumbar herniated disc    L4,L5  .  Osteoarthritis   . Osteopenia   . Rheumatoid arthritis, adult ([place]) 2016   both feet  . Skin cancer   . Spinal stenosis    lumbar  . Tremors of nervous system   . Vertigo     Past Surgical History:  Procedure Laterality Date  . ABDOMINAL HYSTERECTOMY  2003   TAH still has ovaries--[name]  . CERVICAL LAMINECTOMY  2002   C5/C6  . CHOLECYSTECTOMY  2008  . fractured ankle Left 10/2014  . ROTATOR CUFF REPAIR Right 2011    Current Outpatient Medications  Medication Sig Dispense Refill  . busPIRone (BUSPAR) 15 MG tablet Take 15 mg by mouth 2 (two) times daily.     . Ca Phosphate-Cholecalciferol (CALTRATE GUMMY BITES PO) Take by mouth QID.    Marland Kitchen cetirizine (ZYRTEC) 10 MG tablet Take 10 mg by mouth daily as needed for allergies.     . clonazePAM (KLONOPIN) 0.5 MG tablet Take 0.5 mg by mouth 4 (four) times daily.     . Coenzyme Q10 (CO Q 10) 60 MG CAPS Take by mouth.    . Cyanocobalamin (B-12) 2500 MCG TABS Take by mouth daily.    . cyclobenzaprine (FLEXERIL) 10 MG tablet Take 10 mg by mouth as needed for muscle spasms.    . diclofenac sodium (VOLTAREN) 1 % GEL  Apply 2 g topically as needed (artritisis). 100 g 11  . ESTRACE VAGINAL 0.1 MG/GM vaginal cream Apply 1 application topically daily. Apply small amount externally to vaginal area daily    . fluticasone (FLONASE) 50 MCG/ACT nasal spray Place 2 sprays into both nostrils at bedtime as needed for allergies.   4  . gabapentin (NEURONTIN) 300 MG capsule TAKE 1 CAPSULE (300 MG TOTAL) BY MOUTH AT BEDTIME. 30 capsule 0  . hydrocortisone (ANUSOL-HC) 2.5 % rectal cream Place 1 application rectally 2 (two) times daily. 30 g 1  . ibuprofen (ADVIL,MOTRIN) 600 MG tablet Take 600 mg by mouth every 6 (six) hours as needed (pain).    Marland Kitchen lidocaine (XYLOCAINE) 5 % ointment Apply daily as needed    . LORazepam (ATIVAN) 0.5 MG tablet Take 1 tablet (0.5 mg total) by mouth 3 (three) times daily as needed for anxiety (and nausea). 12 tablet 0  . Multiple  Vitamins-Minerals (HAIR/SKIN/NAILS/BIOTIN PO) Take by mouth daily.    . Niacin (VITAMIN B-3 PO) Take by mouth daily.    Marland Kitchen OVER THE COUNTER MEDICATION Take 1 capsule by mouth 3 (three) times daily. Hydro eye    . OVER THE COUNTER MEDICATION 50 mg at bedtime. Zyquil    . PARoxetine (PAXIL) 20 MG tablet Take 20 mg by mouth 2 (two) times daily.  4  . promethazine (PHENERGAN) 25 MG tablet Take 1 tablet (25 mg total) by mouth every 6 (six) hours as needed for nausea or vomiting. 30 tablet 0  . propranolol (INDERAL) 20 MG tablet Take 1 tablet (20 mg total) by mouth 2 (two) times daily. 180 tablet 3  . traMADol (ULTRAM) 50 MG tablet Take 1 tablet (50 mg total) by mouth every 8 (eight) hours as needed. 30 tablet 1  . trimethoprim (TRIMPEX) 100 MG tablet Take 1 tablet by mouth daily.     No current facility-administered medications for this visit.     Family History  Problem Relation Age of Onset  . Coronary artery disease Father 20       CABG 2002, followd by Dr, Ron Parker  . Prostate cancer Father   . Bladder Cancer Father   . Skin cancer Father   . Diabetes Father 53       DM type 2  . Atrial fibrillation Mother   . Multiple myeloma Mother   . Skin cancer Mother   . Coronary artery disease Mother   . Irritable bowel syndrome Mother   . Cancer Mother        multiple myeloma  . Diabetes Maternal Grandmother   . Hypertension Maternal Grandmother   . Diabetes Maternal Grandfather   . Hypertension Maternal Grandfather   . Osteoarthritis Paternal Grandmother   . Cancer Maternal Aunt        bone cancer  . Colon cancer Neg Hx     Review of Systems  HENT: Positive for sinus pain.   Gastrointestinal:       IBS symptoms  Genitourinary: Positive for urgency.  Musculoskeletal: Positive for joint swelling.  Neurological: Positive for headaches.  Psychiatric/Behavioral: The patient is nervous/anxious.   All other systems reviewed and are negative.   Exam:   BP 118/60   Pulse 60   Resp 14    Ht 5' 11"  (1.803 m)   Wt 170 lb 9.6 oz (77.4 kg)   LMP 02/09/2002 (Approximate)   BMI 23.79 kg/m     General appearance: alert, cooperative and appears stated age Head: Normocephalic, without  obvious abnormality, atraumatic Neck: no adenopathy, supple, symmetrical, trachea midline and thyroid normal to inspection and palpation Lungs: clear to auscultation bilaterally Breasts: normal appearance, no masses or tenderness, No nipple retraction or dimpling, No nipple discharge or bleeding, No axillary or supraclavicular adenopathy Heart: regular rate and rhythm Abdomen: soft, non-tender; no masses, no organomegaly Extremities: extremities normal, atraumatic, no cyanosis or edema Skin: Skin color, texture, turgor normal. No rashes or lesions Lymph nodes: Cervical, supraclavicular, and axillary nodes normal. No abnormal inguinal nodes palpated Neurologic: Grossly normal  Pelvic: External genitalia:  no lesions              Urethra:  normal appearing urethra with no masses, tenderness or lesions              Bartholins and Skenes: normal                 Vagina: normal appearing vagina with normal color and discharge, no lesions              Cervix:  absent              Pap taken: No. Bimanual Exam:  Uterus:   absent              Adnexa: no mass, fullness, tenderness              Rectal exam: Yes.  .  Confirms.              Anus:  normal sphincter tone, no lesions  Chaperone was present for exam.  Assessment:   Well woman visit with normal exam. Status post TAH.  Ovaries remain.  Hx fragility fracture and normal bone density.  Elevated cholesterol. Recurrent UTI.  Tx through [name].  Plan: Mammogram screening. Recommended self breast awareness. Pap and HR HPV as above. Guidelines for Calcium, Vitamin D, regular exercise program including cardiovascular and weight bearing exercise. She will call GI to see if she is due for colonoscopy. Follow up annually and prn.   After visit  summary provided.

## 2018-03-26 ENCOUNTER — Encounter: Payer: Self-pay | Admitting: Obstetrics and Gynecology

## 2018-03-26 ENCOUNTER — Other Ambulatory Visit: Payer: Self-pay

## 2018-03-26 ENCOUNTER — Ambulatory Visit (INDEPENDENT_AMBULATORY_CARE_PROVIDER_SITE_OTHER): Payer: Medicare Other | Admitting: Obstetrics and Gynecology

## 2018-03-26 VITALS — BP 118/60 | HR 60 | Resp 14 | Ht 71.0 in | Wt 170.6 lb

## 2018-03-26 DIAGNOSIS — Z01419 Encounter for gynecological examination (general) (routine) without abnormal findings: Secondary | ICD-10-CM

## 2018-03-26 DIAGNOSIS — Z124 Encounter for screening for malignant neoplasm of cervix: Secondary | ICD-10-CM | POA: Diagnosis not present

## 2018-03-26 NOTE — Patient Instructions (Signed)

## 2018-04-15 ENCOUNTER — Ambulatory Visit: Payer: Self-pay

## 2018-04-15 NOTE — Telephone Encounter (Signed)
Incoming call from Patient with complaint of neck and back pain radiating to right arm and shoulder.  Patient states it comes and goes.  States sometimes her right her right hand tingles.  Reports a herniated disc.  Reports a burning sensation. Patient also reports  Sx of  Mild  vertigo.  Patient scheduled for appointment  12/ 9/19 arrive  2:45 FOR A 3:00pm appointment. Provided advice.  Patient voiced understanding.   .    Reason for Disposition . [1] MODERATE back pain (e.g., interferes with normal activities) AND [2] present > 3 days  Answer Assessment - Initial Assessment Questions 1. ONSET: "When did the pain begin?"      *No Answer* 2. LOCATION: "Where does it hurt?" (upper, mid or lower back)     *No Answer* 3. SEVERITY: "How bad is the pain?"  (e.g., Scale 1-10; mild, moderate, or severe)   - MILD (1-3): doesn't interfere with normal activities    - MODERATE (4-7): interferes with normal activities or awakens from sleep    - SEVERE (8-10): excruciating pain, unable to do any normal activities      *No Answer* 4. PATTERN: "Is the pain constant?" (e.g., yes, no; constant, intermittent)      *No Answer* 5. RADIATION: "Does the pain shoot into your legs or elsewhere?"     *No Answer* 6. CAUSE:  "What do you think is causing the back pain?"      *No Answer* 7. BACK OVERUSE:  "Any recent lifting of heavy objects, strenuous work or exercise?"     *No Answer* 8. MEDICATIONS: "What have you taken so far for the pain?" (e.g., nothing, acetaminophen, NSAIDS)     *No Answer* 9. NEUROLOGIC SYMPTOMS: "Do you have any weakness, numbness, or problems with bowel/bladder control?"     *No Answer* 10. OTHER SYMPTOMS: "Do you have any other symptoms?" (e.g., fever, abdominal pain, burning with urination, blood in urine)       *No Answer* 11. PREGNANCY: "Is there any chance you are pregnant?" (e.g., yes, no; LMP)       *No Answer*  Answer Assessment - Initial Assessment Questions 1. DESCRIPTION:  "Describe your dizziness."     Roll ove to right spins 2. VERTIGO: "Do you feel like either you or the room is spinning or tilting?"      yes 3. LIGHTHEADED: "Do you feel lightheaded?" (e.g., somewhat faint, woozy, weak upon standing)     *No Answer* 4. SEVERITY: "How bad is it?"  "Can you walk?"   - MILD - Feels unsteady but walking normally.   - MODERATE - Feels very unsteady when walking, but not falling; interferes with normal activities (e.g., school, work) .   - SEVERE - Unable to walk without falling (requires assistance).     Mild to moderate 5. ONSET:  "When did the dizziness begin?"     39 comes and goes 6. AGGRAVATING FACTORS: "Does anything make it worse?" (e.g., standing, change in head position)     Weather change barametric pressure 7. CAUSE: "What do you think is causing the dizziness?"     8. RECURRENT SYMPTOM: "Have you had dizziness before?" If so, ask: "When was the last time?" "What happened that time?"     *No Answer* 9. OTHER SYMPTOMS: "Do you have any other symptoms?" (e.g., headache, weakness, numbness, vomiting, earache)     na 10. PREGNANCY: "Is there any chance you are pregnant?" "When was your last menstrual period?"  Na, hysterectomy  Protocols used: BACK PAIN-A-AH, DIZZINESS - VERTIGO-A-AH

## 2018-04-18 NOTE — Progress Notes (Addendum)
[facility] at [facility], [place], [place] [address] [phone]  Date:  04/19/2018   Name:  Angela Ray   DOB:  August 21, 1950   MRN:  [id number]  PCP:  Darreld Mclean, MD    Chief Complaint: Back Pain (4 herniated disk in neck and lower back, bruning sensation radiating to right shoulder, tingling in fingers, using heating pad and advil, sleeping on left side due to arm pain)   History of Present Illness:  Angela Ray is a 67 y.o. very pleasant female patient who presents with the following:  Here today with concern of a burning pain in her right neck and right upper arm, which she suspects is due to discs in her neck  I last saw her in June after she was in the ER with GI symptoms: Following up on dyslipidemia today. Pt came in fasting but was given a snack here as she was feeling lightheaded. Today is Monday- on Friday she was in the ER with 3 days of nausea, vomiting- thought to be dehydrated, she was treated with IVF and her labs all looked ok. She is feeling much better now but still a bit weak. Encouraged her to drink plenty of fluids and liberalize her salt intake for several days, until feeling better She is on inderal at a low dose for her tremor- as she uses this chronically and her dose is low to begin with we will continue this Refilled her prometazine which she uses prn Will plan further follow- up pending labs  Pneumonia series: start prevnar today  Flu: done  She did have MRI of her lumbar and cervical spines in June of 2016 which were abnormal  C spine 10/2014: IMPRESSION:  Abnormal MRI cervical spine (without) demonstrating: 1. At C4-5: rightward disc bulging and uncovertebral joint hypertrophy with mild right foraminal stenosis. 2. Anterior discectomy and fusion with metal hardware at C4-C5. 3. No intrinsic or compressive spinal cord lesions.  About 2 weeks ago she started having more  pain in her neck - this lasted for about 2 days, and is now radiating into her right arm as well  She did have a cervical laminectomy in 2002 She has a burning pain in her right shoulder and neck off an on for the last couple of years- however this is now worse than normal Reminds her of when she had her surgery in 2001; she had more severe pain like this  She notes that at the time of her cervical spine surgery she also had numbness in her right hand.  This numbness resolved the operation, and has not returned  She is using ibuprofen 600 which does help some.  She is using a tens unit and heating pad Her operation was done here in [place] but her doctor has moved away, she cannot recall the practice that did her operation  She is not able to use steroids due to suicidal ideation, and cannot tolerate hydro/oxycodone or codeine but she can take tramadol She uses gabapentin as needed for leg cramps   No fall or other acute injury  Wt Readings from Last 3 Encounters:  04/19/18 172 lb (78 kg)  03/26/18 170 lb 9.6 oz (77.4 kg)  10/09/17 173 lb (78.5 kg)   She has lost from 189 lbs on purpose through diet and exercise  She does suffer from depression and anxiety She is on buspar Klonopin 0.5 scheduled  Gabapentin  qhs prn paxil 20  Patient Active Problem List   Diagnosis Date Noted  . External hemorrhoid 12/09/2016  . Globus sensation 12/09/2016  . Gas pain 12/09/2016  . Abdominal pain, epigastric 12/09/2016  . Chronic insomnia 12/04/2016  . Essential tremor 12/04/2016  . Fibromyalgia 11/25/2016  . Lumbar stenosis 11/25/2016  . Paresthesia 05/25/2015  . Tremor 05/25/2015  . IBS (irritable bowel syndrome) 02/07/2015  . Recurrent UTI 02/07/2015  . Functional constipation 12/21/2013  . Mixed incontinence 12/16/2012  . Chest pain 06/24/2011  . Anxiety 07/11/2008  . GERD 07/05/2008  . GASTRITIS 07/05/2008  . HIATAL HERNIA 07/05/2008  . GALLSTONES 07/05/2008    Past Medical History:   Diagnosis Date  . Anxiety   . Barrett's esophagus    in the past  . Colon polyp   . Depression   . Dupuytren's contracture of both hands 10/2016   sees Dr.Gramig--[facility]  . Essential tremor 2016   legs, arm,lower jaw  . Female bladder prolapse   . Fibroid   . Fibromyalgia   . GERD (gastroesophageal reflux disease)   . Heat exhaustion   . Herniated disc, cervical    C3  . Hiatal hernia   . History of cardiovascular stress test    Lexiscan Myoview 7/16:  EF 65%, inferolateral defect (prob artifactual), no ischemia  . History of recurrent UTIs   . Hyperlipidemia   . IBS (irritable bowel syndrome)    constipation  . Leg cramps   . Low back pain   . Lumbar herniated disc    L4,L5  . Osteoarthritis   . Osteopenia   . Rheumatoid arthritis, adult ([facility]) 2016   both feet  . Skin cancer   . Spinal stenosis    lumbar  . Tremors of nervous system   . Vertigo     Past Surgical History:  Procedure Laterality Date  . ABDOMINAL HYSTERECTOMY  2003   TAH still has ovaries--[name]  . CERVICAL LAMINECTOMY  2002   C5/C6  . CHOLECYSTECTOMY  2008  . fractured ankle Left 10/2014  . ROTATOR CUFF REPAIR Right 2011    Social History   Tobacco Use  . Smoking status: Never Smoker  . Smokeless tobacco: Never Used  Substance Use Topics  . Alcohol use: No    Alcohol/week: 0.0 standard drinks    Comment: occ glass of wine  . Drug use: No    Family History  Problem Relation Age of Onset  . Coronary artery disease Father 70       CABG 2002, followd by Dr, Ron Parker  . Prostate cancer Father   . Bladder Cancer Father   . Skin cancer Father   . Diabetes Father 50       DM type 2  . Atrial fibrillation Mother   . Multiple myeloma Mother   . Skin cancer Mother   . Coronary artery disease Mother   . Irritable bowel syndrome Mother   . Cancer Mother        multiple myeloma  . Diabetes Maternal Grandmother   . Hypertension Maternal Grandmother   . Diabetes Maternal  Grandfather   . Hypertension Maternal Grandfather   . Osteoarthritis Paternal Grandmother   . Cancer Maternal Aunt        bone cancer  . Colon cancer Neg Hx     Allergies  Allergen Reactions  . Aciphex [Rabeprazole Sodium] Other (See Comments)    REACTION: "FELT LIKE INDIGESTION"  . Bupropion Hcl Other (See Comments)  REACTION: "DIDN'T MAKE ME FEEL RIGHT IN THE HEAD"  . Cephalexin Nausea Only and Other (See Comments)    REACTION: "GAVE YEAST INFECTION"  . Codeine Other (See Comments)    REACTION: "SEVERE NAUSEA, HEART PALPITATION, PASSING OUT, HEART ATTACK LIKE SYMPTOMS"  . Codeine Phosphate Other (See Comments)    REACTION: "SEVERE NAUSEA, HEART PALPITATION, PASSING OUT, HEART ATTACK LIKE SYMPTOMS"  . Esomeprazole Magnesium Other (See Comments)    Nexium=REACTION: " CAUSE TASTE BUDS TO FALL OUT"  . Hydrocodone-Acetaminophen Other (See Comments)    REACTION: "SEVERE NAUSEA, HEART PALPITATION, PASSING OUT, HEART ATTACK LIKE SYMPTOMS"  . Oxycodone-Acetaminophen Other (See Comments)    REACTION: "SEVERE NAUSEA, HEART PALPITATION, PASSING OUT, HEART ATTACK LIKE SYMPTOMS"  . Sertraline Hcl Other (See Comments)    REACTION: "DIDN'T MAKE ME FEEL RIGHT IN THE HEAD"  . Doxycycline Hyclate Nausea Only    Medication list has been reviewed and updated.  Current Outpatient Medications on File Prior to Visit  Medication Sig Dispense Refill  . busPIRone (BUSPAR) 15 MG tablet Take 15 mg by mouth 2 (two) times daily.     . cetirizine (ZYRTEC) 10 MG tablet Take 10 mg by mouth daily as needed for allergies.     . clonazePAM (KLONOPIN) 0.5 MG tablet Take 0.5 mg by mouth 4 (four) times daily.     . Cyanocobalamin (B-12) 2500 MCG TABS Take by mouth daily.    . cyclobenzaprine (FLEXERIL) 10 MG tablet Take 10 mg by mouth as needed for muscle spasms.    . diclofenac sodium (VOLTAREN) 1 % GEL Apply 2 g topically as needed (artritisis). 100 g 11  . ESTRACE VAGINAL 0.1 MG/GM vaginal cream Apply 1  application topically daily. Apply small amount externally to vaginal area daily    . fluticasone (FLONASE) 50 MCG/ACT nasal spray Place 2 sprays into both nostrils at bedtime as needed for allergies.   4  . gabapentin (NEURONTIN) 300 MG capsule TAKE 1 CAPSULE (300 MG TOTAL) BY MOUTH AT BEDTIME. 30 capsule 0  . hydrocortisone (ANUSOL-HC) 2.5 % rectal cream Place 1 application rectally 2 (two) times daily. 30 g 1  . ibuprofen (ADVIL,MOTRIN) 600 MG tablet Take 600 mg by mouth every 6 (six) hours as needed (pain).    Marland Kitchen lidocaine (XYLOCAINE) 5 % ointment Apply daily as needed    . LORazepam (ATIVAN) 0.5 MG tablet Take 1 tablet (0.5 mg total) by mouth 3 (three) times daily as needed for anxiety (and nausea). 12 tablet 0  . Multiple Vitamins-Minerals (HAIR/SKIN/NAILS/BIOTIN PO) Take by mouth daily.    Marland Kitchen OVER THE COUNTER MEDICATION Take 1 capsule by mouth 3 (three) times daily. Hydro eye    . OVER THE COUNTER MEDICATION 50 mg at bedtime. Zyquil    . PARoxetine (PAXIL) 20 MG tablet Take 20 mg by mouth 2 (two) times daily.  4  . promethazine (PHENERGAN) 25 MG tablet Take 1 tablet (25 mg total) by mouth every 6 (six) hours as needed for nausea or vomiting. 30 tablet 0  . propranolol (INDERAL) 20 MG tablet Take 1 tablet (20 mg total) by mouth 2 (two) times daily. 180 tablet 3  . trimethoprim (TRIMPEX) 100 MG tablet Take 1 tablet by mouth daily.    . [DISCONTINUED] tolterodine (DETROL LA) 4 MG 24 hr capsule Take 4 mg by mouth daily. Needs name brand only--generic does not work for patient.     No current facility-administered medications on file prior to visit.     Review  of Systems:  As per HPI- otherwise negative. No fever or chills, no chest pain or shortness of breath, no rash  Physical Examination: Vitals:   04/19/18 1459  BP: 108/70  Pulse: 66  Resp: 16  Temp: 97.7 F (36.5 C)  SpO2: 97%   Vitals:   04/19/18 1459  Weight: 172 lb (78 kg)  Height: 5' 11"  (1.803 m)   Body mass index is  23.99 kg/m. Ideal Body Weight: Weight in (lb) to have BMI = 25: 178.9  GEN: WDWN, NAD, Non-toxic, A & O x 3, looks well, normal weight.  Fit and spry for her age. HEENT: Atraumatic, Normocephalic. Neck supple. No masses, No LAD. Ears and Nose: No external deformity. CV: RRR, No M/G/R. No JVD. No thrill. No extra heart sounds. PULM: CTA B, no wheezes, crackles, rhonchi. No retractions. No resp. distress. No accessory muscle use. EXTR: No c/c/e NEURO Normal gait.  PSYCH: Normally interactive. Conversant. Not depressed or anxious appearing.  Calm demeanor.  Normal cervical range of motion, to flexion, extension and rotation to either side. She does have tenderness and spasm in her right trapezius and deltoid muscles. Normal range of motion of the right shoulder, normal strength and deep tendon reflex of both upper extremities. No rash or other skin finding.  Assessment and Plan: Cervical radiculopathy due to degenerative joint disease of spine - Plan: traMADol (ULTRAM) 50 MG tablet, MR Cervical Spine Wo Contrast, cyclobenzaprine (FLEXERIL) 10 MG tablet, ibuprofen (ADVIL,MOTRIN) 600 MG tablet, DG Cervical Spine Complete  Immunization due - Plan: Pneumococcal conjugate vaccine 13-valent IM  Here today with recurrent radicular pain in the right arm and neck, likely from her cervical spine.  She had a cervical decompression/fusion in 2002 for similar symptoms.  Will obtain plain films of her neck today, and plan an MRI as well. For symptom control will use ibuprofen as needed.  For more intense pain she may use either Flexeril or tramadol, depending on which makes her feel less more or less drowsy and the time of day. Cautioned that Paxil and tramadol may increase risk of serotonin syndrome, and went over symptoms of this. She is cautioned about risk of sedation with both Flexeril and tramadol, and cautioned not to use them at the same time.  She is not able to take oral prednisone Given Prevnar  13 today Await MRI, she is asked to let me know if any worsening of her symptoms in the meantime.  Signed Lamar Blinks, MD  Meds ordered this encounter  Medications  . traMADol (ULTRAM) 50 MG tablet    Sig: Take 1 tablet (50 mg total) by mouth every 8 (eight) hours as needed.    Dispense:  40 tablet    Refill:  1  . cyclobenzaprine (FLEXERIL) 10 MG tablet    Sig: Take 1 tablet (10 mg total) by mouth 2 (two) times daily as needed for muscle spasms.    Dispense:  30 tablet    Refill:  1  . ibuprofen (ADVIL,MOTRIN) 600 MG tablet    Sig: Take 1 tablet (600 mg total) by mouth every 8 (eight) hours as needed (pain).    Dispense:  40 tablet    Refill:  1   Received her neck films 12/10, gave her a call.  LMOM, will try her back later   Dg Cervical Spine Complete  Result Date: 04/20/2018 CLINICAL DATA:  Cervical pain extending to RIGHT arm, burning sensation, constant pain for a year worse in last 3 weeks  and worse at night EXAM: CERVICAL SPINE - COMPLETE 4+ VIEW COMPARISON:  MR cervical spine 11/01/2014 FINDINGS: Prevertebral soft tissues normal thickness. Bones appear demineralized. Prior anterior fusion of C5-C6 with good bony union. Mild degenerative disc and facet disease changes throughout cervical spine. No acute fracture, subluxation, or bone destruction. Small uncovertebral spurs noted at the RIGHT C4-C5 and LEFT C3-C4 neural foramina. Lung apices clear. IMPRESSION: Prior C5-C6 anterior fusion. Scattered degenerative disc and facet disease changes of the cervical spine as above. If patient has persistent radicular symptoms to the upper extremities, consider further assessment by MR imaging of the cervical spine to evaluate. Electronically Signed   By: Lavonia Dana M.D.   On: 04/20/2018 08:36   Called her on 12/10-was able to reach.  Whenever x-ray reports. She is getting her MRI this coming Saturday. Unfortunately, she did come down with diarrhea and a mild fever after our visit in  the office.  She has an appoint with my partner Dr. Larose Kells tomorrow, if her symptoms persist.  Reminded her to be judicious in her use of tramadol and Flexeril especially as she is on Klonopin.  She is to avoid using these medications in combination due to risk of excessive sedation.

## 2018-04-19 ENCOUNTER — Ambulatory Visit (INDEPENDENT_AMBULATORY_CARE_PROVIDER_SITE_OTHER): Payer: Medicare Other | Admitting: Family Medicine

## 2018-04-19 ENCOUNTER — Encounter: Payer: Self-pay | Admitting: Family Medicine

## 2018-04-19 ENCOUNTER — Ambulatory Visit (HOSPITAL_BASED_OUTPATIENT_CLINIC_OR_DEPARTMENT_OTHER)
Admission: RE | Admit: 2018-04-19 | Discharge: 2018-04-19 | Disposition: A | Discharge status: Home / Self Care | Payer: Medicare Other | Source: Ambulatory Visit | Attending: Family Medicine | Admitting: Family Medicine

## 2018-04-19 VITALS — BP 108/70 | HR 66 | Temp 97.7°F | Resp 16 | Ht 71.0 in | Wt 172.0 lb

## 2018-04-19 DIAGNOSIS — Z23 Encounter for immunization: Secondary | ICD-10-CM | POA: Diagnosis not present

## 2018-04-19 DIAGNOSIS — M4722 Other spondylosis with radiculopathy, cervical region: Secondary | ICD-10-CM | POA: Diagnosis not present

## 2018-04-19 DIAGNOSIS — M5032 Other cervical disc degeneration, mid-cervical region, unspecified level: Secondary | ICD-10-CM | POA: Diagnosis not present

## 2018-04-19 MED ORDER — TRAMADOL HCL 50 MG PO TABS: 50.0000 mg | ORAL_TABLET | Freq: Three times a day (TID) | ORAL | 1 refills | Status: DC | PRN

## 2018-04-19 MED ORDER — CYCLOBENZAPRINE HCL 10 MG PO TABS: 10.0000 mg | ORAL_TABLET | Freq: Two times a day (BID) | ORAL | 1 refills | Status: DC | PRN

## 2018-04-19 MED ORDER — IBUPROFEN 600 MG PO TABS: 600.0000 mg | ORAL_TABLET | Freq: Three times a day (TID) | ORAL | 1 refills | Status: DC | PRN

## 2018-04-19 NOTE — Patient Instructions (Signed)
As we discussed, using Paxil and tramadol together can increase your risk of serotonin syndrome.  This is a relatively rare condition, but something to think about.  Please see the list of symptoms below, and if any of these occur stop tramadol and let me know right away.  Agitation or restlessness. Confusion. Rapid heart rate and high blood pressure. Dilated pupils. Loss of muscle coordination or twitching muscles. Muscle rigidity. Heavy sweating. Diarrhea.   Please go to the ground floor to have x-rays of your neck.  I will be in touch of these results ASAP. We will also set you up for an MRI of your neck. Use the ibuprofen as needed for neck pain.  You may use tramadol or Flexeril also as needed for more severe pain.  Remember that both of these medications can cause drowsiness, use extreme caution if combine with any other sedating medication.  Also, fewer of these medications could affect your ability to drive safely. Please let me know if any worsening or change in your symptoms while we wait on the MRI.

## 2018-04-20 ENCOUNTER — Ambulatory Visit: Payer: Self-pay

## 2018-04-20 NOTE — Telephone Encounter (Signed)
Pt called stating that she was in the office yesterday for a pneumonia vaccine. Today she has had uncontrolled diarrhea a fever of 100.4 and generalized aches.  She states the diarrhea was severe but has stopped for now.  She reports 2 episodes of diarrhea. She has avoided eating but has had 2 gaiteraids and lots of water.  She states her urine is clear.  She had eaten a piece of toast. She has had no nausea . Patient was advised that the pneumonia can cause some mild symptoms but that she should have started to feel better. Pt agreed to schedule appointment for her symptoms after I spoke to [facility] and verified that she would not be charged for canceling if she feel better tomorrow. Appointment scheduled per protocol. Care advice read to patient. Pt verbalized understanding of all instructions.  Reason for Disposition . [1] MILD diarrhea (e.g., 1-3 or more stools than normal in past 24 hours) without known cause AND [2] present >  7 days  Answer Assessment - Initial Assessment Questions 1. DIARRHEA SEVERITY: "How bad is the diarrhea?" "How many extra stools have you had in the past 24 hours than normal?"    - NO DIARRHEA (SCALE 0)   - MILD (SCALE 1-3): Few loose or mushy BMs; increase of 1-3 stools over normal daily number of stools; mild increase in ostomy output.   -  MODERATE (SCALE 4-7): Increase of 4-6 stools daily over normal; moderate increase in ostomy output. * SEVERE (SCALE 8-10; OR 'WORST POSSIBLE'): Increase of 7 or more stools daily over normal; moderate increase in ostomy output; incontinence.     Loose watery 2 times 2. ONSET: "When did the diarrhea begin?"      today 3. BM CONSISTENCY: "How loose or watery is the diarrhea?"      Loose watery 4. VOMITING: "Are you also vomiting?" If so, ask: "How many times in the past 24 hours?"      no 5. ABDOMINAL PAIN: "Are you having any abdominal pain?" If yes: "What does it feel like?" (e.g., crampy, dull, intermittent, constant)       no 6. ABDOMINAL PAIN SEVERITY: If present, ask: "How bad is the pain?"  (e.g., Scale 1-10; mild, moderate, or severe)   - MILD (1-3): doesn't interfere with normal activities, abdomen soft and not tender to touch    - MODERATE (4-7): interferes with normal activities or awakens from sleep, tender to touch    - SEVERE (8-10): excruciating pain, doubled over, unable to do any normal activities       none 7. ORAL INTAKE: If vomiting, "Have you been able to drink liquids?" "How much fluids have you had in the past 24 hours?"     2 gatoraide 8. HYDRATION: "Any signs of dehydration?" (e.g., dry mouth [not just dry lips], too weak to stand, dizziness, new weight loss) "When did you last urinate?"     Clear urine 9. EXPOSURE: "Have you traveled to a foreign country recently?" "Have you been exposed to anyone with diarrhea?" "Could you have eaten any food that was spoiled?"     no 10. ANTIBIOTIC USE: "Are you taking antibiotics now or have you taken antibiotics in the past 2 months?"       no 11. OTHER SYMPTOMS: "Do you have any other symptoms?" (e.g., fever, blood in stool)       Fever 100.4 achy 12. PREGNANCY: "Is there any chance you are pregnant?" "When was your last menstrual period?"  [name]  Protocols used: DIARRHEA-A-AH

## 2018-04-21 ENCOUNTER — Ambulatory Visit: Payer: Medicare Other | Admitting: Internal Medicine

## 2018-04-24 ENCOUNTER — Ambulatory Visit (HOSPITAL_BASED_OUTPATIENT_CLINIC_OR_DEPARTMENT_OTHER)
Admission: RE | Admit: 2018-04-24 | Discharge: 2018-04-24 | Disposition: A | Discharge status: Home / Self Care | Payer: Medicare Other | Source: Ambulatory Visit | Attending: Family Medicine | Admitting: Family Medicine

## 2018-04-24 DIAGNOSIS — M4802 Spinal stenosis, cervical region: Secondary | ICD-10-CM | POA: Diagnosis not present

## 2018-04-24 DIAGNOSIS — M4722 Other spondylosis with radiculopathy, cervical region: Secondary | ICD-10-CM | POA: Insufficient documentation

## 2018-04-24 DIAGNOSIS — Z981 Arthrodesis status: Secondary | ICD-10-CM | POA: Diagnosis not present

## 2018-04-24 DIAGNOSIS — M542 Cervicalgia: Secondary | ICD-10-CM | POA: Diagnosis not present

## 2018-04-27 ENCOUNTER — Telehealth: Payer: Self-pay | Admitting: *Deleted

## 2018-04-27 ENCOUNTER — Telehealth: Payer: Self-pay | Admitting: Family Medicine

## 2018-04-27 ENCOUNTER — Encounter: Payer: Self-pay | Admitting: Family Medicine

## 2018-04-27 DIAGNOSIS — M4722 Other spondylosis with radiculopathy, cervical region: Secondary | ICD-10-CM

## 2018-04-27 NOTE — Telephone Encounter (Signed)
Called her back- she did not really like Dr. Ellene Route the one time she saw him in ?2016.  As it has been a few years we may be able to have her see a different doc.  Will suggest maybe Dr. Saintclair Halsted of [facility]  Dg Cervical Spine Complete  Result Date: 04/20/2018 CLINICAL DATA:  Cervical pain extending to RIGHT arm, burning sensation, constant pain for a year worse in last 3 weeks and worse at night EXAM: CERVICAL SPINE - COMPLETE 4+ VIEW COMPARISON:  MR cervical spine 11/01/2014 FINDINGS: Prevertebral soft tissues normal thickness. Bones appear demineralized. Prior anterior fusion of C5-C6 with good bony union. Mild degenerative disc and facet disease changes throughout cervical spine. No acute fracture, subluxation, or bone destruction. Small uncovertebral spurs noted at the RIGHT C4-C5 and LEFT C3-C4 neural foramina. Lung apices clear. IMPRESSION: Prior C5-C6 anterior fusion. Scattered degenerative disc and facet disease changes of the cervical spine as above. If patient has persistent radicular symptoms to the upper extremities, consider further assessment by MR imaging of the cervical spine to evaluate. Electronically Signed   By: Lavonia Dana M.D.   On: 04/20/2018 08:36   Mr Cervical Spine Wo Contrast  Result Date: 04/24/2018 CLINICAL DATA:  67 year old female with 3-4 months of progressed chronic neck pain. Pain radiating down the right arm with numbness in the right hand. Prior surgery. EXAM: MRI CERVICAL SPINE WITHOUT CONTRAST TECHNIQUE: Multiplanar, multisequence MR imaging of the cervical spine was performed. No intravenous contrast was administered. COMPARISON:  Cervical spine MRI 11/01/2014.  Brain MRI 02/22/2015. FINDINGS: Alignment: Stable since 2016. Mild retrolisthesis of C4 on C5 with relatively preserved cervical lordosis. Vertebrae: Mild hardware susceptibility artifact in the lower cervical spine related to previous C5-C6 ACDF. No marrow edema or evidence of acute osseous abnormality.  Background bone marrow signal is normal. Cord: Spinal cord signal is within normal limits at all visualized levels. Posterior Fossa, vertebral arteries, paraspinal tissues: Negative visible brain parenchyma. Cervicomedullary junction is within normal limits. Preserved major vascular flow voids in the neck. Negative neck soft tissues and visible lung apices. Disc levels: C2-C3:  Negative. C3-C4: Left greater than right foraminal disc bulging and endplate spurring with mild facet hypertrophy. No spinal stenosis. Moderate left C4 foraminal stenosis appears progressed since 2016. C4-C5: Disc space loss with right eccentric circumferential disc bulge. Right foraminal disc and endplate spurring. Mild ligament flavum hypertrophy. Mild spinal stenosis. Mild if any spinal cord mass effect. Mild left and moderate right C5 foraminal stenosis. This level has progressed. C5-C6:  Prior ACDF with evidence of solid arthrodesis.  No stenosis. C6-C7: Mild disc bulging with small central disc protrusion. No significant spinal stenosis. Moderate right facet hypertrophy. Mild to moderate right C7 foraminal stenosis. This level is stable. C7-T1: Mild to moderate facet hypertrophy greater on the left. No stenosis. IMPRESSION: 1. Chronic C5-C6 ACDF with evidence of solid arthrodesis. 2. Stable adjacent segment disease at C6-C7, but increased C4-C5 degeneration with mild spinal stenosis, mild left and moderate right C5 foraminal stenosis. Mild if any spinal cord mass effect, no cord signal abnormality. 3. C3-C4 degeneration also appears progressed since 2016 with moderate left C4 foraminal stenosis. Electronically Signed   By: Genevie Ann M.D.   On: 04/24/2018 15:54

## 2018-04-27 NOTE — Telephone Encounter (Signed)
Spoke with patient about results.  She stated that she still feel the same as when she was here, sore to touch and no changes. Pain is constant.  She is taking ibuprofen, which helps with the pain a little.  She stated that she is afraid to take the pain medication that was given.  The doctor who did her surgery moved to [place].  She has seen Dr. Ellene Route in the past but did not like what he had to say.  Patient stated that "that she would like to see who Dr. Lorelei Ray would see if she had to see someone for her back and neck."  So patient is willing to see a neurosurgeon.

## 2018-04-27 NOTE — Telephone Encounter (Signed)
You do some some worsening of your spine disease/ arthritis at C4/5 and C3/4. There is some mild stenosis of the spinal cord canal and some stenosis of the "nerve holes" at C5.   How are you feeling? How is your arm? I would recommend having you see neurosurgery for their opinion about the next step. Are you still in touch with the doctor who did your surgery years ago?  [place]

## 2018-04-27 NOTE — Telephone Encounter (Signed)
Received her MRI  Dg Cervical Spine Complete  Result Date: 04/20/2018 CLINICAL DATA:  Cervical pain extending to RIGHT arm, burning sensation, constant pain for a year worse in last 3 weeks and worse at night EXAM: CERVICAL SPINE - COMPLETE 4+ VIEW COMPARISON:  MR cervical spine 11/01/2014 FINDINGS: Prevertebral soft tissues normal thickness. Bones appear demineralized. Prior anterior fusion of C5-C6 with good bony union. Mild degenerative disc and facet disease changes throughout cervical spine. No acute fracture, subluxation, or bone destruction. Small uncovertebral spurs noted at the RIGHT C4-C5 and LEFT C3-C4 neural foramina. Lung apices clear. IMPRESSION: Prior C5-C6 anterior fusion. Scattered degenerative disc and facet disease changes of the cervical spine as above. If patient has persistent radicular symptoms to the upper extremities, consider further assessment by MR imaging of the cervical spine to evaluate. Electronically Signed   By: Lavonia Dana M.D.   On: 04/20/2018 08:36   Mr Cervical Spine Wo Contrast  Result Date: 04/24/2018 CLINICAL DATA:  67 year old female with 3-4 months of progressed chronic neck pain. Pain radiating down the right arm with numbness in the right hand. Prior surgery. EXAM: MRI CERVICAL SPINE WITHOUT CONTRAST TECHNIQUE: Multiplanar, multisequence MR imaging of the cervical spine was performed. No intravenous contrast was administered. COMPARISON:  Cervical spine MRI 11/01/2014.  Brain MRI 02/22/2015. FINDINGS: Alignment: Stable since 2016. Mild retrolisthesis of C4 on C5 with relatively preserved cervical lordosis. Vertebrae: Mild hardware susceptibility artifact in the lower cervical spine related to previous C5-C6 ACDF. No marrow edema or evidence of acute osseous abnormality. Background bone marrow signal is normal. Cord: Spinal cord signal is within normal limits at all visualized levels. Posterior Fossa, vertebral arteries, paraspinal tissues: Negative visible brain  parenchyma. Cervicomedullary junction is within normal limits. Preserved major vascular flow voids in the neck. Negative neck soft tissues and visible lung apices. Disc levels: C2-C3:  Negative. C3-C4: Left greater than right foraminal disc bulging and endplate spurring with mild facet hypertrophy. No spinal stenosis. Moderate left C4 foraminal stenosis appears progressed since 2016. C4-C5: Disc space loss with right eccentric circumferential disc bulge. Right foraminal disc and endplate spurring. Mild ligament flavum hypertrophy. Mild spinal stenosis. Mild if any spinal cord mass effect. Mild left and moderate right C5 foraminal stenosis. This level has progressed. C5-C6:  Prior ACDF with evidence of solid arthrodesis.  No stenosis. C6-C7: Mild disc bulging with small central disc protrusion. No significant spinal stenosis. Moderate right facet hypertrophy. Mild to moderate right C7 foraminal stenosis. This level is stable. C7-T1: Mild to moderate facet hypertrophy greater on the left. No stenosis. IMPRESSION: 1. Chronic C5-C6 ACDF with evidence of solid arthrodesis. 2. Stable adjacent segment disease at C6-C7, but increased C4-C5 degeneration with mild spinal stenosis, mild left and moderate right C5 foraminal stenosis. Mild if any spinal cord mass effect, no cord signal abnormality. 3. C3-C4 degeneration also appears progressed since 2016 with moderate left C4 foraminal stenosis. Electronically Signed   By: Genevie Ann M.D.   On: 04/24/2018 15:54   Called pt to discuss but di not reach [facility] Will send a [employer]

## 2018-04-29 ENCOUNTER — Encounter: Payer: Self-pay | Admitting: Family Medicine

## 2018-04-29 NOTE — Telephone Encounter (Signed)
Pt states that she would like to know if Dr. Lorelei Pont would recommend Dr. Ovidio Kin.  Pt states that a nurse friend of hers recommends that physician. Pt can be reached at [phone]

## 2018-04-29 NOTE — Telephone Encounter (Signed)
Pt also sent me a mychart message which I replied to

## 2018-05-12 NOTE — Progress Notes (Signed)
[place] at [facility] [address], [place], [place] [address] [phone]  Date:  05/13/2018   Name:  Angela Ray   DOB:  Mar 19, 1951   MRN:  [id number]  PCP:  Darreld Mclean, MD    Chief Complaint: Leg Swelling (fell on right leg, soreness to touch, bruising, "knots in vein" , 3 weeks ago)   History of Present Illness:  Angela Ray is a 68 y.o. very pleasant female patient who presents with the following:  Patient with history of insomnia, tremor, fibromyalgia, lumbar stenosis.   I saw her just about 3 weeks ago with burning in her right neck and upper arm, for which we got a cervical MRI.  This did show worsening of her degenerative spine disease, and I referred her to see neurosurgery She does have a neurosurgery appointment coming up at the end of this month  Here today with concern of a recent fall over a baby gate that she is using for her new puppy- this occurred 3 weeks ago  She tripped over the gate and cracked her right shin on the gate Her shin was was black and blue, swollen It is still tender to walk on, but she is able to walk normally It is still sore to touch  She was concerned about a "hairline fracture" so she came in for evaluation Patient Active Problem List   Diagnosis Date Noted  . External hemorrhoid 12/09/2016  . Globus sensation 12/09/2016  . Gas pain 12/09/2016  . Abdominal pain, epigastric 12/09/2016  . Chronic insomnia 12/04/2016  . Essential tremor 12/04/2016  . Fibromyalgia 11/25/2016  . Lumbar stenosis 11/25/2016  . Paresthesia 05/25/2015  . Tremor 05/25/2015  . IBS (irritable bowel syndrome) 02/07/2015  . Recurrent UTI 02/07/2015  . Functional constipation 12/21/2013  . Mixed incontinence 12/16/2012  . Chest pain 06/24/2011  . Anxiety 07/11/2008  . GERD 07/05/2008  . GASTRITIS 07/05/2008  . HIATAL HERNIA 07/05/2008  . GALLSTONES 07/05/2008    Past Medical History:   Diagnosis Date  . Anxiety   . Barrett's esophagus    in the past  . Colon polyp   . Depression   . Dupuytren's contracture of both hands 10/2016   sees Dr.Gramig--[facility]  . Essential tremor 2016   legs, arm,lower jaw  . Female bladder prolapse   . Fibroid   . Fibromyalgia   . GERD (gastroesophageal reflux disease)   . Heat exhaustion   . Herniated disc, cervical    C3  . Hiatal hernia   . History of cardiovascular stress test    Lexiscan Myoview 7/16:  EF 65%, inferolateral defect (prob artifactual), no ischemia  . History of recurrent UTIs   . Hyperlipidemia   . IBS (irritable bowel syndrome)    constipation  . Leg cramps   . Low back pain   . Lumbar herniated disc    L4,L5  . Osteoarthritis   . Osteopenia   . Rheumatoid arthritis, adult ([place]) 2016   both feet  . Skin cancer   . Spinal stenosis    lumbar  . Tremors of nervous system   . Vertigo     Past Surgical History:  Procedure Laterality Date  . ABDOMINAL HYSTERECTOMY  2003   TAH still has ovaries--[name]  . CERVICAL LAMINECTOMY  2002   C5/C6  . CHOLECYSTECTOMY  2008  . fractured ankle Left 10/2014  . ROTATOR CUFF REPAIR Right  2011    Social History   Tobacco Use  . Smoking status: Never Smoker  . Smokeless tobacco: Never Used  Substance Use Topics  . Alcohol use: No    Alcohol/week: 0.0 standard drinks    Comment: occ glass of wine  . Drug use: No    Family History  Problem Relation Age of Onset  . Coronary artery disease Father 72       CABG 2002, followd by Dr, Ron Parker  . Prostate cancer Father   . Bladder Cancer Father   . Skin cancer Father   . Diabetes Father 96       DM type 2  . Atrial fibrillation Mother   . Multiple myeloma Mother   . Skin cancer Mother   . Coronary artery disease Mother   . Irritable bowel syndrome Mother   . Cancer Mother        multiple myeloma  . Diabetes Maternal Grandmother   . Hypertension Maternal Grandmother   . Diabetes Maternal  Grandfather   . Hypertension Maternal Grandfather   . Osteoarthritis Paternal Grandmother   . Cancer Maternal Aunt        bone cancer  . Colon cancer Neg Hx     Allergies  Allergen Reactions  . Aciphex [Rabeprazole Sodium] Other (See Comments)    REACTION: "FELT LIKE INDIGESTION"  . Bupropion Hcl Other (See Comments)    REACTION: "DIDN'T MAKE ME FEEL RIGHT IN THE HEAD"  . Cephalexin Nausea Only and Other (See Comments)    REACTION: "GAVE YEAST INFECTION"  . Codeine Other (See Comments)    REACTION: "SEVERE NAUSEA, HEART PALPITATION, PASSING OUT, HEART ATTACK LIKE SYMPTOMS"  . Esomeprazole Magnesium Other (See Comments)    Nexium=REACTION: " CAUSE TASTE BUDS TO FALL OUT"  . Sertraline Hcl Other (See Comments)    REACTION: "DIDN'T MAKE ME FEEL RIGHT IN THE HEAD"  . Doxycycline Hyclate Nausea Only    Medication list has been reviewed and updated.  Current Outpatient Medications on File Prior to Visit  Medication Sig Dispense Refill  . busPIRone (BUSPAR) 15 MG tablet Take 15 mg by mouth 2 (two) times daily.     . cetirizine (ZYRTEC) 10 MG tablet Take 10 mg by mouth daily as needed for allergies.     . clonazePAM (KLONOPIN) 0.5 MG tablet Take 0.5 mg by mouth 4 (four) times daily.     . Cyanocobalamin (B-12) 2500 MCG TABS Take by mouth daily.    . cyclobenzaprine (FLEXERIL) 10 MG tablet Take 1 tablet (10 mg total) by mouth 2 (two) times daily as needed for muscle spasms. 30 tablet 1  . diclofenac sodium (VOLTAREN) 1 % GEL Apply 2 g topically as needed (artritisis). 100 g 11  . ESTRACE VAGINAL 0.1 MG/GM vaginal cream Apply 1 application topically daily. Apply small amount externally to vaginal area daily    . fluticasone (FLONASE) 50 MCG/ACT nasal spray Place 2 sprays into both nostrils at bedtime as needed for allergies.   4  . gabapentin (NEURONTIN) 300 MG capsule TAKE 1 CAPSULE (300 MG TOTAL) BY MOUTH AT BEDTIME. 30 capsule 0  . hydrocortisone (ANUSOL-HC) 2.5 % rectal cream Place 1  application rectally 2 (two) times daily. 30 g 1  . ibuprofen (ADVIL,MOTRIN) 600 MG tablet Take 1 tablet (600 mg total) by mouth every 8 (eight) hours as needed (pain). 40 tablet 1  . lidocaine (XYLOCAINE) 5 % ointment Apply daily as needed    . Multiple Vitamins-Minerals (HAIR/SKIN/NAILS/BIOTIN PO)  Take by mouth daily.    Marland Kitchen OVER THE COUNTER MEDICATION Take 1 capsule by mouth 3 (three) times daily. Hydro eye    . OVER THE COUNTER MEDICATION 50 mg at bedtime. Zyquil    . PARoxetine (PAXIL) 20 MG tablet Take 20 mg by mouth 2 (two) times daily.  4  . promethazine (PHENERGAN) 25 MG tablet Take 1 tablet (25 mg total) by mouth every 6 (six) hours as needed for nausea or vomiting. 30 tablet 0  . propranolol (INDERAL) 20 MG tablet Take 1 tablet (20 mg total) by mouth 2 (two) times daily. 180 tablet 3  . traMADol (ULTRAM) 50 MG tablet Take 1 tablet (50 mg total) by mouth every 8 (eight) hours as needed. 40 tablet 1  . trimethoprim (TRIMPEX) 100 MG tablet Take 1 tablet by mouth daily.    . [DISCONTINUED] tolterodine (DETROL LA) 4 MG 24 hr capsule Take 4 mg by mouth daily. Needs name brand only--generic does not work for patient.     No current facility-administered medications on file prior to visit.     Review of Systems:  As per HPI- otherwise negative. She did not otherwise get hurt in the fall. Otherwise feeling well  Physical Examination: Vitals:   05/13/18 1641  BP: 126/76  Pulse: (!) 59  Resp: 16  SpO2: 97%   Vitals:   05/13/18 1641  Weight: 168 lb (76.2 kg)  Height: 5' 11"  (1.803 m)   Body mass index is 23.43 kg/m. Ideal Body Weight: Weight in (lb) to have BMI = 25: 178.9  GEN: WDWN, NAD, Non-toxic, A & O x 3, looks well, normal weight HEENT: Atraumatic, Normocephalic. Neck supple. No masses, No LAD. Ears and Nose: No external deformity. CV: RRR, No M/G/R. No JVD. No thrill. No extra heart sounds. PULM: CTA B, no wheezes, crackles, rhonchi. No retractions. No resp. distress.  No accessory muscle use. EXTR: No c/c/e NEURO Normal gait.  PSYCH: Normally interactive. Conversant. Not depressed or anxious appearing.  Calm demeanor.  Her legs are not swollen, there is no calf tenderness or cord on exam. Right anterior lower leg displays a bruise along the mid tibia with accompanying tenderness.  The bruise appears to be resolving as it there is now a light green color. There is minimal to no swelling at the site of the injury at this point. Ankle and foot are normal, knee is normal    Assessment and Plan: Shin injury, right, initial encounter - Plan: DG Tibia/Fibula Right  Here today following a fall about 3 weeks ago when she hit her shin on a baby gate.  There is no fracture apparent on her films as above, she feels reassured and will let me know if symptoms do not continue to improve  Signed Lamar Blinks, MD  CLINICAL DATA:  Pain.  Injury 3 weeks ago.  EXAM: RIGHT TIBIA AND FIBULA - 2 VIEW COMPARISON:  None. FINDINGS: There is no evidence of fracture or other focal bone lesions. Soft tissues are unremarkable. IMPRESSION: Negative.

## 2018-05-13 ENCOUNTER — Ambulatory Visit (HOSPITAL_BASED_OUTPATIENT_CLINIC_OR_DEPARTMENT_OTHER)
Admission: RE | Admit: 2018-05-13 | Discharge: 2018-05-13 | Disposition: A | Discharge status: Home / Self Care | Payer: Medicare Other | Source: Ambulatory Visit | Attending: Family Medicine | Admitting: Family Medicine

## 2018-05-13 ENCOUNTER — Ambulatory Visit (INDEPENDENT_AMBULATORY_CARE_PROVIDER_SITE_OTHER): Payer: Medicare Other | Admitting: Family Medicine

## 2018-05-13 ENCOUNTER — Encounter: Payer: Self-pay | Admitting: Family Medicine

## 2018-05-13 VITALS — BP 126/76 | HR 59 | Resp 16 | Ht 71.0 in | Wt 168.0 lb

## 2018-05-13 DIAGNOSIS — S8991XA Unspecified injury of right lower leg, initial encounter: Secondary | ICD-10-CM

## 2018-05-13 DIAGNOSIS — M79661 Pain in right lower leg: Secondary | ICD-10-CM | POA: Diagnosis not present

## 2018-05-13 NOTE — Patient Instructions (Addendum)
No fracture seen in your leg today   EXAM: RIGHT TIBIA AND FIBULA - 2 VIEW  COMPARISON:  None.  FINDINGS: There is no evidence of fracture or other focal bone lesions. Soft tissues are unremarkable.  IMPRESSION: Negative.    Continue ice, and let me know if not getting better over the next few weeks

## 2018-05-14 DIAGNOSIS — D0462 Carcinoma in situ of skin of left upper limb, including shoulder: Secondary | ICD-10-CM | POA: Diagnosis not present

## 2018-05-14 DIAGNOSIS — D485 Neoplasm of uncertain behavior of skin: Secondary | ICD-10-CM | POA: Diagnosis not present

## 2018-05-14 DIAGNOSIS — C44629 Squamous cell carcinoma of skin of left upper limb, including shoulder: Secondary | ICD-10-CM | POA: Diagnosis not present

## 2018-05-14 DIAGNOSIS — L700 Acne vulgaris: Secondary | ICD-10-CM | POA: Diagnosis not present

## 2018-05-21 ENCOUNTER — Telehealth: Payer: Self-pay | Admitting: *Deleted

## 2018-05-21 ENCOUNTER — Other Ambulatory Visit: Payer: Self-pay | Admitting: Family Medicine

## 2018-05-21 DIAGNOSIS — M4722 Other spondylosis with radiculopathy, cervical region: Secondary | ICD-10-CM

## 2018-05-21 NOTE — Telephone Encounter (Signed)
Received Dermatopathology Report results from [facility]; forwarded to provider/SLS 01/10

## 2018-05-24 ENCOUNTER — Encounter: Payer: Self-pay | Admitting: Family Medicine

## 2018-05-24 DIAGNOSIS — D049 Carcinoma in situ of skin, unspecified: Secondary | ICD-10-CM | POA: Insufficient documentation

## 2018-05-24 MED FILL — busPIRone HCL 30 MG TABS: 30 | 90 days supply | Qty: 90 | Fill #1

## 2018-06-05 ENCOUNTER — Other Ambulatory Visit: Payer: Self-pay | Admitting: Family Medicine

## 2018-06-05 DIAGNOSIS — M4722 Other spondylosis with radiculopathy, cervical region: Secondary | ICD-10-CM

## 2018-06-10 DIAGNOSIS — M47816 Spondylosis without myelopathy or radiculopathy, lumbar region: Secondary | ICD-10-CM | POA: Diagnosis not present

## 2018-06-10 DIAGNOSIS — M545 Low back pain: Secondary | ICD-10-CM | POA: Diagnosis not present

## 2018-06-11 DIAGNOSIS — L814 Other melanin hyperpigmentation: Secondary | ICD-10-CM | POA: Diagnosis not present

## 2018-06-11 DIAGNOSIS — L700 Acne vulgaris: Secondary | ICD-10-CM | POA: Diagnosis not present

## 2018-06-11 DIAGNOSIS — Z85828 Personal history of other malignant neoplasm of skin: Secondary | ICD-10-CM | POA: Diagnosis not present

## 2018-06-11 DIAGNOSIS — D225 Melanocytic nevi of trunk: Secondary | ICD-10-CM | POA: Diagnosis not present

## 2018-06-11 DIAGNOSIS — L821 Other seborrheic keratosis: Secondary | ICD-10-CM | POA: Diagnosis not present

## 2018-06-18 DIAGNOSIS — M5416 Radiculopathy, lumbar region: Secondary | ICD-10-CM | POA: Diagnosis not present

## 2018-06-18 DIAGNOSIS — M5116 Intervertebral disc disorders with radiculopathy, lumbar region: Secondary | ICD-10-CM | POA: Diagnosis not present

## 2018-06-23 ENCOUNTER — Telehealth: Payer: Self-pay

## 2018-06-23 ENCOUNTER — Ambulatory Visit: Payer: Self-pay | Admitting: *Deleted

## 2018-06-23 DIAGNOSIS — R197 Diarrhea, unspecified: Secondary | ICD-10-CM

## 2018-06-23 NOTE — Telephone Encounter (Signed)
Copied from [place] [phone]. Topic: Referral - Request for Referral >> Jun 23, 2018  1:45 PM [name] wrote: Has patient seen PCP for this complaint? No (Pt has had explosive diarrhea(light brown) and gas with a really bad odor for 1 month. Pt has not changed her diet or medications and doesn't know why this is happening. Pt usually Pt has IBS constipation. There is no stomach pains  *If NO, is insurance requiring patient see PCP for this issue before PCP can refer them?unknown Referral for which specialty: Gertie Fey Preferred provider/office: [facility]/ Dr. Dell Ponto [phone] Reason for referral: Diarrhea and gas

## 2018-06-23 NOTE — Telephone Encounter (Signed)
Pt called with having diarrhea for over a month now. She has a hx of IBS . When she feels like she needs to pass gas, it is almost too late and she is incontinent of loose stool. When she has the urge to go, it is explosive diarrhea. The stool is brown in color almost look like "chilli". The smell of the gas is so bad it almost makes her nauseated. And it is lingering.  No bloody or black stools, no dehydration. No stomach cramps or fevers. With IBS she has those periods of constipation and will have to take a laxative. It has been over a month since she took a laxative.  Now she is taking imodium for the diarrhea. She was prescribed an antibiotic in January. It was doxycline, she feels like it upset her stomach. Only took 4 days of the antibiotic, before stopping it. Has not traveled to a foreign country in the last month. Has an appointment with her GI provider on the [address].  She is requesting a female provider. Appointment scheduled as per requested. Advised to call back for any other concerns. Pt voiced understanding.  Reason for Disposition . [1] SEVERE diarrhea (e.g., 7 or more times / day more than normal) AND [2] present > 24 hours (1 day)  Answer Assessment - Initial Assessment Questions 1. DIARRHEA SEVERITY: "How bad is the diarrhea?" "How many extra stools have you had in the past 24 hours than normal?"    - NO DIARRHEA (SCALE 0)   - MILD (SCALE 1-3): Few loose or mushy BMs; increase of 1-3 stools over normal daily number of stools; mild increase in ostomy output.   -  MODERATE (SCALE 4-7): Increase of 4-6 stools daily over normal; moderate increase in ostomy output. * SEVERE (SCALE 8-10; OR 'WORST POSSIBLE'): Increase of 7 or more stools daily over normal; moderate increase in ostomy output; incontinence.     Moderate to severe 2. ONSET: "When did the diarrhea begin?"      Over a month ago 3. BM CONSISTENCY: "How loose or watery is the diarrhea?"      loose 4. VOMITING: "Are  you also vomiting?" If so, ask: "How many times in the past 24 hours?"      no 5. ABDOMINAL PAIN: "Are you having any abdominal pain?" If yes: "What does it feel like?" (e.g., crampy, dull, intermittent, constant)      no 6. ABDOMINAL PAIN SEVERITY: If present, ask: "How bad is the pain?"  (e.g., Scale 1-10; mild, moderate, or severe)   - MILD (1-3): doesn't interfere with normal activities, abdomen soft and not tender to touch    - MODERATE (4-7): interferes with normal activities or awakens from sleep, tender to touch    - SEVERE (8-10): excruciating pain, doubled over, unable to do any normal activities       No pain 7. ORAL INTAKE: If vomiting, "Have you been able to drink liquids?" "How much fluids have you had in the past 24 hours?"     yes 8. HYDRATION: "Any signs of dehydration?" (e.g., dry mouth [not just dry lips], too weak to stand, dizziness, new weight loss) "When did you last urinate?"     no 9. EXPOSURE: "Have you traveled to a foreign country recently?" "Have you been exposed to anyone with diarrhea?" "Could you have eaten any food that was spoiled?"     no 10. ANTIBIOTIC USE: "Are you taking antibiotics now or have you taken antibiotics in the  past 2 months?"       Yes , doxycycline given to her January. 11. OTHER SYMPTOMS: "Do you have any other symptoms?" (e.g., fever, blood in stool)       no 12. PREGNANCY: "Is there any chance you are pregnant?" "When was your last menstrual period?"       n/a  Protocols used: DIARRHEA-A-AH

## 2018-06-23 NOTE — Telephone Encounter (Signed)
Message from Sharene Skeans sent at 06/23/2018 1:52 PM EST   Summary: advise   Pt has had explosive diarrhea(light brown) and gas with a really bad odor for 1 month. Pt has not changed her diet or medications and doesn't know why this is happening. Pt usually has IBS constipation. There is no stomach pains / please advise

## 2018-06-24 ENCOUNTER — Encounter: Payer: Self-pay | Admitting: Family Medicine

## 2018-06-24 NOTE — Addendum Note (Signed)
Addended by: Lamar Blinks C on: 06/24/2018 11:19 AM   Modules accepted: Orders

## 2018-06-25 ENCOUNTER — Encounter: Payer: Self-pay | Admitting: Family

## 2018-06-25 ENCOUNTER — Ambulatory Visit (INDEPENDENT_AMBULATORY_CARE_PROVIDER_SITE_OTHER): Payer: Medicare Other | Admitting: Family

## 2018-06-25 VITALS — BP 114/70 | HR 66 | Temp 98.4°F | Resp 16 | Ht 71.0 in | Wt 163.0 lb

## 2018-06-25 DIAGNOSIS — R197 Diarrhea, unspecified: Secondary | ICD-10-CM

## 2018-06-25 LAB — CBC WITH DIFFERENTIAL/PLATELET
Absolute Monocytes: 743 cells/uL (ref 200–950)
Basophils Absolute: 24 cells/uL (ref 0–200)
Basophils Relative: 0.3 %
Eosinophils Absolute: 24 cells/uL (ref 15–500)
Eosinophils Relative: 0.3 %
HCT: 38.3 % (ref 35.0–45.0)
Hemoglobin: 13.4 g/dL (ref 11.7–15.5)
Lymphs Abs: 940 cells/uL (ref 850–3900)
MCH: 32.4 pg (ref 27.0–33.0)
MCHC: 35 g/dL (ref 32.0–36.0)
MCV: 92.5 fL (ref 80.0–100.0)
MPV: 9.5 fL (ref 7.5–12.5)
Monocytes Relative: 9.4 %
NEUTROS PCT: 78.1 %
Neutro Abs: 6170 cells/uL (ref 1500–7800)
Platelets: 299 10*3/uL (ref 140–400)
RBC: 4.14 10*6/uL (ref 3.80–5.10)
RDW: 12.6 % (ref 11.0–15.0)
Total Lymphocyte: 11.9 %
WBC: 7.9 10*3/uL (ref 3.8–10.8)

## 2018-06-25 MED ORDER — CHOLESTYRAMINE 4 G PO PACK: 4.0000 g | PACK | Freq: Two times a day (BID) | ORAL | 1 refills | Status: DC | PRN

## 2018-06-25 NOTE — Patient Instructions (Addendum)
Please return stool samples at your earliest convenience.  You may use questran twice daily as needed for diarrhea. Go to ER if you develop severe/worsening diarrhea or abdominal pain. Please keep your upcoming appointment with Gastroenterology.

## 2018-06-25 NOTE — Progress Notes (Signed)
Subjective:    Patient ID: Angela Ray, female    DOB: 10-13-1950, 68 y.o.   MRN: [id number]  HPI  Patient is a 68 yr old female with hx of IBS and cholecystectomy who presents today with chief complaint of diarrhea.  Reports that this has been present x 1 month and is associated with gas.  Denies abdominal pain.   Reports that her IBS is typically constipation not diarrhea.  Reports that she was treated on 1/4 with an antibiotic (doxycycline). No recent travel.  She has tried immodium with brief improvement.    Denies black or bloody stools.  Notes that she is having explosive stools. Has had a few episodes of stool incontinence since stool is so watery.   Review of Systems See HPI  Past Medical History:  Diagnosis Date  . Anxiety   . Barrett's esophagus    in the past  . Colon polyp   . Depression   . Dupuytren's contracture of both hands 10/2016   sees Dr.Gramig--[facility]  . Essential tremor 2016   legs, arm,lower jaw  . Female bladder prolapse   . Fibroid   . Fibromyalgia   . GERD (gastroesophageal reflux disease)   . Heat exhaustion   . Herniated disc, cervical    C3  . Hiatal hernia   . History of cardiovascular stress test    Lexiscan Myoview 7/16:  EF 65%, inferolateral defect (prob artifactual), no ischemia  . History of recurrent UTIs   . Hyperlipidemia   . IBS (irritable bowel syndrome)    constipation  . Leg cramps   . Low back pain   . Lumbar herniated disc    L4,L5  . Osteoarthritis   . Osteopenia   . Rheumatoid arthritis, adult ([place]) 2016   both feet  . Skin cancer   . Spinal stenosis    lumbar  . Tremors of nervous system   . Vertigo      Social History   Socioeconomic History  . Marital status: Married    Spouse name: Not on file  . Number of children: 0  . Years of education: 13  . Highest education level: Not on file  Occupational History  . Occupation: Retired    Fish farm manager: RETIRED  Social Needs  . Financial resource  strain: Not on file  . Food insecurity:    Worry: Not on file    Inability: Not on file  . Transportation needs:    Medical: Not on file    Non-medical: Not on file  Tobacco Use  . Smoking status: Never Smoker  . Smokeless tobacco: Never Used  Substance and Sexual Activity  . Alcohol use: No    Alcohol/week: 0.0 standard drinks    Comment: occ glass of wine  . Drug use: No  . Sexual activity: Not Currently    Partners: Male    Birth control/protection: Surgical    Comment: TAH still has ovaries  Lifestyle  . Physical activity:    Days per week: Not on file    Minutes per session: Not on file  . Stress: Not on file  Relationships  . Social connections:    Talks on phone: Not on file    Gets together: Not on file    Attends religious service: Not on file    Active member of club or organization: Not on file    Attends meetings of clubs or organizations: Not on file    Relationship status: Not on  file  . Intimate partner violence:    Fear of current or ex partner: Not on file    Emotionally abused: Not on file    Physically abused: Not on file    Forced sexual activity: Not on file  Other Topics Concern  . Not on file  Social History Narrative   Lives at home with her husband.   Right-handed.   Rarely uses caffeine.    Past Surgical History:  Procedure Laterality Date  . ABDOMINAL HYSTERECTOMY  2003   TAH still has ovaries--[name]  . CERVICAL LAMINECTOMY  2002   C5/C6  . CHOLECYSTECTOMY  2008  . fractured ankle Left 10/2014  . ROTATOR CUFF REPAIR Right 2011    Family History  Problem Relation Age of Onset  . Coronary artery disease Father 71       CABG 2002, followd by Dr, Ron Parker  . Prostate cancer Father   . Bladder Cancer Father   . Skin cancer Father   . Diabetes Father 26       DM type 2  . Atrial fibrillation Mother   . Multiple myeloma Mother   . Skin cancer Mother   . Coronary artery disease Mother   . Irritable bowel syndrome Mother   .  Cancer Mother        multiple myeloma  . Diabetes Maternal Grandmother   . Hypertension Maternal Grandmother   . Diabetes Maternal Grandfather   . Hypertension Maternal Grandfather   . Osteoarthritis Paternal Grandmother   . Cancer Maternal Aunt        bone cancer  . Colon cancer Neg Hx     Allergies  Allergen Reactions  . Aciphex [Rabeprazole Sodium] Other (See Comments)    REACTION: "FELT LIKE INDIGESTION"  . Bupropion Hcl Other (See Comments)    REACTION: "DIDN'T MAKE ME FEEL RIGHT IN THE HEAD"  . Cephalexin Nausea Only and Other (See Comments)    REACTION: "GAVE YEAST INFECTION"  . Codeine Other (See Comments)    REACTION: "SEVERE NAUSEA, HEART PALPITATION, PASSING OUT, HEART ATTACK LIKE SYMPTOMS"  . Esomeprazole Magnesium Other (See Comments)    Nexium=REACTION: " CAUSE TASTE BUDS TO FALL OUT"  . Sertraline Hcl Other (See Comments)    REACTION: "DIDN'T MAKE ME FEEL RIGHT IN THE HEAD"  . Doxycycline Hyclate Nausea Only    Current Outpatient Medications on File Prior to Visit  Medication Sig Dispense Refill  . busPIRone (BUSPAR) 15 MG tablet Take 15 mg by mouth 2 (two) times daily.     . cetirizine (ZYRTEC) 10 MG tablet Take 10 mg by mouth daily as needed for allergies.     . clonazePAM (KLONOPIN) 0.5 MG tablet Take 0.5 mg by mouth 4 (four) times daily.     . Cyanocobalamin (B-12) 2500 MCG TABS Take by mouth daily.    . cyclobenzaprine (FLEXERIL) 10 MG tablet TAKE 1 TABLET BY MOUTH TWICE A DAY AS NEEDED FOR MUSCLE SPASMS 30 tablet 2  . diclofenac sodium (VOLTAREN) 1 % GEL Apply 2 g topically as needed (artritisis). 100 g 11  . ESTRACE VAGINAL 0.1 MG/GM vaginal cream Apply 1 application topically daily. Apply small amount externally to vaginal area daily    . fluticasone (FLONASE) 50 MCG/ACT nasal spray Place 2 sprays into both nostrils at bedtime as needed for allergies.   4  . gabapentin (NEURONTIN) 300 MG capsule TAKE 1 CAPSULE (300 MG TOTAL) BY MOUTH AT BEDTIME. 30  capsule 0  . hydrocortisone (ANUSOL-HC)  2.5 % rectal cream Place 1 application rectally 2 (two) times daily. 30 g 1  . ibuprofen (ADVIL,MOTRIN) 600 MG tablet TAKE 1 TABLET (600 MG TOTAL) BY MOUTH EVERY 8 (EIGHT) HOURS AS NEEDED (PAIN). 40 tablet 1  . lidocaine (XYLOCAINE) 5 % ointment Apply daily as needed    . Multiple Vitamins-Minerals (HAIR/SKIN/NAILS/BIOTIN PO) Take by mouth daily.    Marland Kitchen OVER THE COUNTER MEDICATION Take 1 capsule by mouth 3 (three) times daily. Hydro eye    . OVER THE COUNTER MEDICATION 50 mg at bedtime. Zyquil    . PARoxetine (PAXIL) 20 MG tablet Take 20 mg by mouth 2 (two) times daily.  4  . promethazine (PHENERGAN) 25 MG tablet Take 1 tablet (25 mg total) by mouth every 6 (six) hours as needed for nausea or vomiting. 30 tablet 0  . propranolol (INDERAL) 20 MG tablet Take 1 tablet (20 mg total) by mouth 2 (two) times daily. 180 tablet 3  . traMADol (ULTRAM) 50 MG tablet Take 1 tablet (50 mg total) by mouth every 8 (eight) hours as needed. 40 tablet 1  . trimethoprim (TRIMPEX) 100 MG tablet Take 1 tablet by mouth daily.    . [DISCONTINUED] tolterodine (DETROL LA) 4 MG 24 hr capsule Take 4 mg by mouth daily. Needs name brand only--generic does not work for patient.     No current facility-administered medications on file prior to visit.     BP 114/70 (BP Location: Right Arm, Patient Position: Sitting, Cuff Size: Small)   Pulse 66   Temp 98.4 F (36.9 C) (Oral)   Resp 16   Ht _0  (1.803 m)   Wt 163 lb (73.9 kg)   LMP 02/09/2002 (Approximate)   SpO2 100%   BMI 22.73 kg/m       Objective:   Physical Exam Constitutional:      Appearance: She is well-developed.  Neck:     Musculoskeletal: Neck supple.     Thyroid: No thyromegaly.  Cardiovascular:     Rate and Rhythm: Normal rate and regular rhythm.     Heart sounds: Normal heart sounds. No murmur.  Pulmonary:     Effort: Pulmonary effort is normal. No respiratory distress.     Breath sounds: Normal breath  sounds. No wheezing.  Abdominal:     General: There is no distension.     Palpations: Abdomen is soft.     Tenderness: There is no abdominal tenderness.  Skin:    General: Skin is warm and dry.  Neurological:     Mental Status: She is alert and oriented to person, place, and time.  Psychiatric:        Behavior: Behavior normal.        Thought Content: Thought content normal.        Judgment: Judgment normal.           Assessment & Plan:  Diarrhea- ? IBS.  Need to rule out C diff or other infectious cause. Will obtain stool panel. She is advised to keep her upcoming appointment with GI. Will add questran for now to help with the loose stools.

## 2018-06-28 ENCOUNTER — Other Ambulatory Visit: Payer: Medicare Other

## 2018-06-28 DIAGNOSIS — R197 Diarrhea, unspecified: Secondary | ICD-10-CM | POA: Diagnosis not present

## 2018-06-28 NOTE — Addendum Note (Signed)
Addended by: Kelle Darting A on: 06/28/2018 08:27 AM   Modules accepted: Orders

## 2018-07-01 ENCOUNTER — Telehealth: Payer: Self-pay | Admitting: *Deleted

## 2018-07-01 ENCOUNTER — Telehealth: Payer: Self-pay | Admitting: Family

## 2018-07-01 LAB — GI PROFILE, STOOL, PCR
Adenovirus F 40/41: NOT DETECTED
Astrovirus: NOT DETECTED
C difficile toxin A/B: NOT DETECTED
Campylobacter: NOT DETECTED
Cryptosporidium: NOT DETECTED
Cyclospora cayetanensis: NOT DETECTED
Entamoeba histolytica: NOT DETECTED
Enteroaggregative E coli: NOT DETECTED
Enteropathogenic E coli: NOT DETECTED
Enterotoxigenic E coli: NOT DETECTED
Giardia lamblia: NOT DETECTED
Norovirus GI/GII: NOT DETECTED
PLESIOMONAS SHIGELLOIDES: NOT DETECTED
Rotavirus A: NOT DETECTED
SAPOVIRUS: NOT DETECTED
SHIGELLA/ENTEROINVASIVE E COLI: NOT DETECTED
Salmonella: NOT DETECTED
Shiga-toxin-producing E coli: NOT DETECTED
Vibrio cholerae: NOT DETECTED
Vibrio: NOT DETECTED
Yersinia enterocolitica: NOT DETECTED

## 2018-07-01 NOTE — Telephone Encounter (Signed)
Spoke to patient.  Reviewed negative stool study results.  She reports some improvement in her diarrhea.  She has not tried Questran because she was afraid it would cause constipation.  She did have an appointment scheduled for tomorrow with GI but had to reschedule due to impending weather.  She will see them in early March.  I have advised her to call if symptoms worsen otherwise to keep upcoming appointment with GI.  We discussed that her symptoms are likely either related to IBS or a resolving viral illness.  Patient verbalizes understanding.

## 2018-07-01 NOTE — Telephone Encounter (Signed)
Received Lab Report results from [facility]; forwarded to provider/SLS 02/20

## 2018-07-02 ENCOUNTER — Ambulatory Visit: Payer: Medicare Other | Admitting: Physician Assistant

## 2018-07-13 ENCOUNTER — Ambulatory Visit: Payer: Medicare Other | Admitting: Physician Assistant

## 2018-07-23 ENCOUNTER — Other Ambulatory Visit: Payer: Self-pay

## 2018-07-23 ENCOUNTER — Encounter: Payer: Self-pay | Admitting: Physician Assistant

## 2018-07-23 ENCOUNTER — Ambulatory Visit (INDEPENDENT_AMBULATORY_CARE_PROVIDER_SITE_OTHER): Payer: Medicare Other | Admitting: Physician Assistant

## 2018-07-23 VITALS — BP 98/60 | HR 66 | Temp 98.6°F | Ht 70.5 in | Wt 162.1 lb

## 2018-07-23 DIAGNOSIS — R143 Flatulence: Secondary | ICD-10-CM

## 2018-07-23 DIAGNOSIS — R197 Diarrhea, unspecified: Secondary | ICD-10-CM

## 2018-07-23 NOTE — Patient Instructions (Signed)
Take Align daily for 2 months  You may take Gas X before meals   Please follow up as needed.  If you are age 66 or older, your body mass index should be between 23-30. Your Body mass index is 22.93 kg/m. If this is out of the aforementioned range listed, please consider follow up with your Primary Care Provider.  If you are age 81 or younger, your body mass index should be between 19-25. Your Body mass index is 22.93 kg/m. If this is out of the aformentioned range listed, please consider follow up with your Primary Care Provider.

## 2018-07-23 NOTE — Progress Notes (Signed)
Chief Complaint: Diarrhea and gas  HPI:    Angela Ray is a 68 year old Caucasian female with a past medical history as listed below, known to Dr. Silverio Decamp, who presents to clinic today with a complaint of diarrhea x6 weeks with gas.    03/2015 colonoscopy with 2 polyps, one was a tubular adenoma and the other benign lymphoid aggregate.  EGD at the same time showed irregular Z line, candidal esophagitis for which she was treated with nystatin swish and swallow and gastropathy.  Gastric biopsy showed chronic gastritis without H. pylori, distal esophageal biopsies showed normal mucosa with changes consistent with reflux, mid esophageal biopsies confirmed Candida esophagitis.  At that time patient was changed to Pantoprazole 40 mg twice daily.  Upper GI series was ordered and she was tried on daily probiotic attics and IBgard for gas.  Hydrocortisone cream was given for external hemorrhoid/mild rectal prolapse.   12/09/2016 last office visit with several complaints including increasing heartburn and reflux as well as globus sensation as well as hemorrhoids and "a lot of flatulence".    06/28/2018 GI profile of stool was negative.  CBC was normal.    Today, the patient tells me that 6 weeks ago she started with severe diarrhea.  Tell me that she would have urgency and frequency which made it unable for her to control her bowels and it would "run like water" sometimes she would have accidents just getting out of her chair and it would run down her legs.  Along with this it smelled "horrific".  She also had "gag-matic" gas.  Over the past week or so she has now gone back to her normal constipation.  Tells me she typically has IBS-C.  Has continued with an increased amount of gas and this is "even more smelly than normal".  Does describe being on Dicyclomine for 3 days prior to symptoms starting, this was supposed to be for adult acne but patient tells me it made her stomach upset so she stopped it.  Over the  past 2 days she has had normal soft solid bowel movements.  Has tried Electronics engineer and IBgard in the past which did not seem to make a difference.  Has also tried Gas-X but this was hours after eating.    Denies fever, chills, weight loss, nausea, vomiting, heartburn or reflux.  Past Medical History:  Diagnosis Date   Anxiety    Barrett's esophagus    in the past   Colon polyp    Depression    Dupuytren's contracture of both hands 10/2016   sees Dr.Gramig--[facility]   Essential tremor 2016   legs, arm,lower jaw   Female bladder prolapse    Fibroid    Fibromyalgia    GERD (gastroesophageal reflux disease)    Heat exhaustion    Herniated disc, cervical    C3   Hiatal hernia    History of cardiovascular stress test    Lexiscan Myoview 7/16:  EF 65%, inferolateral defect (prob artifactual), no ischemia   History of recurrent UTIs    Hyperlipidemia    IBS (irritable bowel syndrome)    constipation   Leg cramps    Low back pain    Lumbar herniated disc    L4,L5   Osteoarthritis    Osteopenia    Rheumatoid arthritis, adult ([place]) 2016   both feet   Skin cancer    Spinal stenosis    lumbar   Tremors of nervous system    Vertigo  Past Surgical History:  Procedure Laterality Date   ABDOMINAL HYSTERECTOMY  2003   TAH still has ovaries--[name]   CERVICAL LAMINECTOMY  2002   C5/C6   CHOLECYSTECTOMY  2008   fractured ankle Left 10/2014   ROTATOR CUFF REPAIR Right 2011    Current Outpatient Medications  Medication Sig Dispense Refill   busPIRone (BUSPAR) 15 MG tablet Take 15 mg by mouth 2 (two) times daily.      cetirizine (ZYRTEC) 10 MG tablet Take 10 mg by mouth daily as needed for allergies.      clonazePAM (KLONOPIN) 0.5 MG tablet Take 0.5 mg by mouth 4 (four) times daily.      Cyanocobalamin (B-12) 2500 MCG TABS Take by mouth daily.     cyclobenzaprine (FLEXERIL) 10 MG tablet TAKE 1 TABLET BY MOUTH TWICE A DAY AS NEEDED FOR  MUSCLE SPASMS 30 tablet 2   ESTRACE VAGINAL 0.1 MG/GM vaginal cream Apply 1 application topically daily. Apply small amount externally to vaginal area daily     fluticasone (FLONASE) 50 MCG/ACT nasal spray Place 2 sprays into both nostrils at bedtime as needed for allergies.   4   gabapentin (NEURONTIN) 300 MG capsule TAKE 1 CAPSULE (300 MG TOTAL) BY MOUTH AT BEDTIME. 30 capsule 0   hydrocortisone (ANUSOL-HC) 2.5 % rectal cream Place 1 application rectally 2 (two) times daily. (Patient taking differently: Place 1 application rectally as needed. ) 30 g 1   ibuprofen (ADVIL,MOTRIN) 600 MG tablet TAKE 1 TABLET (600 MG TOTAL) BY MOUTH EVERY 8 (EIGHT) HOURS AS NEEDED (PAIN). 40 tablet 1   lidocaine (XYLOCAINE) 5 % ointment Apply daily as needed     Multiple Vitamins-Minerals (HAIR/SKIN/NAILS/BIOTIN PO) Take by mouth daily.     OVER THE COUNTER MEDICATION Take 1 capsule by mouth 3 (three) times daily. Hydro eye     OVER THE COUNTER MEDICATION 50 mg at bedtime. Zyquil     PARoxetine (PAXIL) 20 MG tablet Take 20 mg by mouth 2 (two) times daily.  4   promethazine (PHENERGAN) 25 MG tablet Take 1 tablet (25 mg total) by mouth every 6 (six) hours as needed for nausea or vomiting. (Patient taking differently: Take 25 mg by mouth as needed for nausea or vomiting. ) 30 tablet 0   propranolol (INDERAL) 20 MG tablet Take 1 tablet (20 mg total) by mouth 2 (two) times daily. 180 tablet 3   traMADol (ULTRAM) 50 MG tablet Take 1 tablet (50 mg total) by mouth every 8 (eight) hours as needed. 40 tablet 1   trimethoprim (TRIMPEX) 100 MG tablet Take 1 tablet by mouth daily.     No current facility-administered medications for this visit.     Allergies as of 07/23/2018 - Review Complete 07/23/2018  Allergen Reaction Noted   Aciphex [rabeprazole sodium] Other (See Comments) 06/24/2011   Bupropion hcl Other (See Comments) 05/19/2006   Cephalexin Nausea Only and Other (See Comments) 05/19/2006   Codeine  Other (See Comments) 06/24/2011   Esomeprazole magnesium Other (See Comments) 06/24/2011   Sertraline hcl Other (See Comments) 05/19/2006   Doxycycline hyclate Nausea Only 05/19/2006    Family History  Problem Relation Age of Onset   Coronary artery disease Father 22       CABG 2002, followd by Dr, Ron Parker   Prostate cancer Father    Bladder Cancer Father    Skin cancer Father    Diabetes Father 60       DM type 2   Atrial  fibrillation Mother    Multiple myeloma Mother    Skin cancer Mother    Coronary artery disease Mother    Irritable bowel syndrome Mother    Cancer Mother        multiple myeloma   Diabetes Maternal Grandmother    Hypertension Maternal Grandmother    Diabetes Maternal Grandfather    Hypertension Maternal Grandfather    Osteoarthritis Paternal Grandmother    Cancer Maternal Aunt        bone cancer   Colon cancer Neg Hx     Social History   Socioeconomic History   Marital status: Married    Spouse name: Not on file   Number of children: 0   Years of education: 12   Highest education level: Not on file  Occupational History   Occupation: Retired    Fish farm manager: RETIRED  Scientist, product/process development strain: Not on file   Food insecurity:    Worry: Not on file    Inability: Not on file   Transportation needs:    Medical: Not on file    Non-medical: Not on file  Tobacco Use   Smoking status: Never Smoker   Smokeless tobacco: Never Used  Substance and Sexual Activity   Alcohol use: No    Alcohol/week: 0.0 standard drinks    Comment: occ glass of wine   Drug use: No   Sexual activity: Not Currently    Partners: Male    Birth control/protection: Surgical    Comment: TAH still has ovaries  Lifestyle   Physical activity:    Days per week: Not on file    Minutes per session: Not on file   Stress: Not on file  Relationships   Social connections:    Talks on phone: Not on file    Gets together: Not on file     Attends religious service: Not on file    Active member of club or organization: Not on file    Attends meetings of clubs or organizations: Not on file    Relationship status: Not on file   Intimate partner violence:    Fear of current or ex partner: Not on file    Emotionally abused: Not on file    Physically abused: Not on file    Forced sexual activity: Not on file  Other Topics Concern   Not on file  Social History Narrative   Lives at home with her husband.   Right-handed.   Rarely uses caffeine.    Review of Systems:    Constitutional: No weight loss, fever or chills Cardiovascular: No chest pain Respiratory: No SOB  Gastrointestinal: See HPI and otherwise negative   Physical Exam:  Vital signs: Ht 5' 10.5" (1.791 m) Comment: height measured without shoes   Wt 162 lb 2 oz (73.5 kg)    LMP 02/09/2002 (Approximate)    BMI 22.93 kg/m   Constitutional:   Pleasant Caucasian female appears to be in NAD, Well developed, Well nourished, alert and cooperative Respiratory: Respirations even and unlabored. Lungs clear to auscultation bilaterally.   No wheezes, crackles, or rhonchi.  Cardiovascular: Normal S1, S2. No MRG. Regular rate and rhythm. No peripheral edema, cyanosis or pallor.  Gastrointestinal:  Soft, nondistended, nontender. No rebound or guarding. Normal bowel sounds. No appreciable masses or hepatomegaly. Psychiatric: Demonstrates good judgement and reason without abnormal affect or behaviors.  MOST RECENT LABS AND IMAGING: CBC    Component Value Date/Time   WBC 7.9 06/25/2018 1455  RBC 4.14 06/25/2018 1455   HGB 13.4 06/25/2018 1455   HGB 13.6 03/06/2017 1106   HGB 13.6 02/02/2015 0854   HCT 38.3 06/25/2018 1455   HCT 41.5 03/06/2017 1106   PLT 299 06/25/2018 1455   PLT 240 03/06/2017 1106   MCV 92.5 06/25/2018 1455   MCV 96 03/06/2017 1106   MCH 32.4 06/25/2018 1455   MCHC 35.0 06/25/2018 1455   RDW 12.6 06/25/2018 1455   RDW 13.2 03/06/2017 1106     LYMPHSABS 940 06/25/2018 1455   MONOABS 0.5 10/09/2017 0647   EOSABS 24 06/25/2018 1455   BASOSABS 24 06/25/2018 1455    CMP     Component Value Date/Time   NA 139 10/09/2017 0647   NA 143 03/06/2017 1106   K 3.6 10/09/2017 0647   CL 102 10/09/2017 0647   CO2 23 10/09/2017 0647   GLUCOSE 121 (H) 10/09/2017 0647   BUN 8 10/09/2017 0647   BUN 14 03/06/2017 1106   CREATININE 0.70 10/09/2017 0647   CREATININE 0.75 02/22/2016 1347   CALCIUM 9.3 10/09/2017 0647   PROT 7.2 10/09/2017 0647   PROT 6.9 03/06/2017 1106   ALBUMIN 4.4 10/09/2017 0647   ALBUMIN 4.6 03/06/2017 1106   AST 26 10/09/2017 0647   ALT 19 10/09/2017 0647   ALKPHOS 33 (L) 10/09/2017 0647   BILITOT 0.8 10/09/2017 0647   BILITOT 0.6 03/06/2017 1106   GFRNONAA >60 10/09/2017 0647   GFRAA >60 10/09/2017 0647    Assessment: 1.  Diarrhea: For 6 weeks, now resolved, GI path panel negative during that time, labs normal; consider viral versus relation to Dicyclomine 2.  Flatulence: Continues per patient, so bad that it runs her husband out of the room; consider relation to recent diarrhea, question viral +/- IBS  Plan: 1.  Discussed the patient she should try Align again once daily for the next 2 months and then discontinue. 2.  Also she can try Gas-X but take this prior to eating. 3.  Patient to follow in clinic if she has return of diarrhea or continued bad smelling flatulence over the next 1 to 2 months. 4.  Patient to follow in clinic with me or Dr. Silverio Decamp as needed in the future.  Ellouise Newer, PA-C [facility] 07/23/2018, 9:22 AM  Cc: Darreld Mclean, MD

## 2018-08-02 ENCOUNTER — Other Ambulatory Visit: Payer: Self-pay | Admitting: Family

## 2018-08-03 NOTE — Progress Notes (Signed)
Reviewed and agree with documentation and assessment and plan. K. Veena Nandigam , MD   

## 2018-08-07 MED FILL — busPIRone HCL 30 MG TABS: 30 | 90 days supply | Qty: 90 | Fill #2

## 2018-09-13 ENCOUNTER — Telehealth: Payer: Self-pay | Admitting: Physician Assistant

## 2018-09-13 NOTE — Telephone Encounter (Signed)
Patient was last seen by APP for diarrhea. This has resolved. She is "back to the constipation." She states she eats fiber "and if I eat any more, I will just go eat the bark off the tree" " not helping." She takes Docusate and Align 5x strength. She has tried Miralax but stopped it "because it causes gas and bloating." In 2016 she tried Linzess 145 mcg. It was stopped later due to diarrhea. She would like to try it again at the 72 mcg dosage. Please advise.

## 2018-09-13 NOTE — Telephone Encounter (Signed)
Pt called wanting to know if she can have a prescription for the lowest dose of linzess.

## 2018-09-13 NOTE — Telephone Encounter (Signed)
This message needs to go to Dr Silverio Decamp.  Anderson Malta last saw him but she is not in the office

## 2018-09-13 NOTE — Telephone Encounter (Signed)
Please see below note thanks.

## 2018-09-13 NOTE — Telephone Encounter (Signed)
Ok

## 2018-09-14 ENCOUNTER — Other Ambulatory Visit: Payer: Self-pay

## 2018-09-14 MED ORDER — LINZESS 72 MCG PO CAPS: 72.0000 ug | ORAL_CAPSULE | Freq: Every day | ORAL | 0 refills | Status: DC

## 2018-09-14 NOTE — Telephone Encounter (Signed)
Left her a message that the medication has been called into the [facility].

## 2018-09-14 NOTE — Telephone Encounter (Signed)
Okay, please send prescription for Linzess 72 mcg daily, 90-day supply.  Schedule tele follow-up visit, next available. Thanks

## 2018-09-27 ENCOUNTER — Telehealth: Payer: Self-pay | Admitting: Gastroenterology

## 2018-09-27 NOTE — Telephone Encounter (Signed)
Dr Nandigam please advise 

## 2018-09-27 NOTE — Telephone Encounter (Signed)
Is it Linzess that she is asking about ? Please verify Ye,s she can take 2 capsules 86mcg daily until she gets the new Rx 151mcg daily. Thanks

## 2018-09-27 NOTE — Telephone Encounter (Signed)
Pt called wanting to ask if she can take two of the pills that were prescribe because just the one is not working.

## 2018-09-27 NOTE — Telephone Encounter (Signed)
Called patient and informed ok to take 2 23mcg linzess and if it works we will send in a new prescription to her pharmacy

## 2018-10-01 ENCOUNTER — Telehealth: Payer: Self-pay | Admitting: Gastroenterology

## 2018-10-01 NOTE — Telephone Encounter (Signed)
Patient said she is still not having a BM.

## 2018-10-01 NOTE — Telephone Encounter (Signed)
Dr Lyndel Safe , you are Doc of the Day.... Please advise  She is currently taking 290 mcg linzess and still not having a bowel movement

## 2018-10-01 NOTE — Telephone Encounter (Signed)
FYI Dr Lillie Columbia over Dr Leland Her recommendations with patient, She stated she had also been taking fiber, I told her to hold off on taking any more fiber until the Miralalx starts working she will call next week with an update

## 2018-10-01 NOTE — Telephone Encounter (Signed)
I have reviewed the previous notes. She was having diarrhea previously. Plan: -Trial of MiraLAX 17 g p.o. once a day -Increase water intake. -For now can continue Linzess at the current dose.  Once MiraLAX start working, can stop Linzess. -Avoid nonsteroidals  Thx  Merrie Roof

## 2018-10-05 NOTE — Telephone Encounter (Signed)
Left message to call back and set up telemed (phone or Doxcimity) appointment with Dr Silverio Decamp.

## 2018-10-05 NOTE — Telephone Encounter (Signed)
Ok thanks 

## 2018-10-05 NOTE — Telephone Encounter (Signed)
Patient called back said she is feeling better and does not need to have a visit.

## 2018-10-05 NOTE — Telephone Encounter (Signed)
Okay thank you, please schedule next available telemedicine visit.

## 2018-10-07 ENCOUNTER — Telehealth: Payer: Self-pay | Admitting: Family Medicine

## 2018-10-07 DIAGNOSIS — G250 Essential tremor: Secondary | ICD-10-CM

## 2018-10-07 NOTE — Telephone Encounter (Signed)
Copied from [place] [phone]. Topic: Quick Communication - Rx Refill/Question >> Oct 07, 2018  1:28 PM Sheran Luz wrote: Medication: propranolol (INDERAL) 20 MG tablet  Patient is requesting refill. 90 day supply.  Preferred Pharmacy (with phone number or street name):[id number] - [place], [place] - [address] [phone] (Phone) [phone] (Fax)

## 2018-10-08 MED ORDER — PROPRANOLOL HCL 20 MG PO TABS: 20.0000 mg | ORAL_TABLET | Freq: Two times a day (BID) | ORAL | 3 refills | Status: DC

## 2018-10-08 NOTE — Telephone Encounter (Signed)
Refilled and sent to pharmacy

## 2018-10-23 ENCOUNTER — Emergency Department (HOSPITAL_BASED_OUTPATIENT_CLINIC_OR_DEPARTMENT_OTHER): Payer: Medicare Other

## 2018-10-23 ENCOUNTER — Encounter: Payer: Self-pay | Admitting: Family Medicine

## 2018-10-23 ENCOUNTER — Emergency Department (HOSPITAL_BASED_OUTPATIENT_CLINIC_OR_DEPARTMENT_OTHER)
Admission: EM | Admit: 2018-10-23 | Discharge: 2018-10-24 | Disposition: A | Discharge status: Home / Self Care | Payer: Medicare Other | Attending: Emergency Medicine | Admitting: Emergency Medicine

## 2018-10-23 ENCOUNTER — Other Ambulatory Visit: Payer: Self-pay

## 2018-10-23 ENCOUNTER — Encounter (HOSPITAL_BASED_OUTPATIENT_CLINIC_OR_DEPARTMENT_OTHER): Payer: Self-pay | Admitting: Emergency Medicine

## 2018-10-23 DIAGNOSIS — Z85828 Personal history of other malignant neoplasm of skin: Secondary | ICD-10-CM | POA: Diagnosis not present

## 2018-10-23 DIAGNOSIS — R072 Precordial pain: Secondary | ICD-10-CM | POA: Diagnosis not present

## 2018-10-23 DIAGNOSIS — Z79899 Other long term (current) drug therapy: Secondary | ICD-10-CM | POA: Diagnosis not present

## 2018-10-23 DIAGNOSIS — R0789 Other chest pain: Secondary | ICD-10-CM | POA: Diagnosis present

## 2018-10-23 DIAGNOSIS — R079 Chest pain, unspecified: Secondary | ICD-10-CM | POA: Diagnosis not present

## 2018-10-23 LAB — BASIC METABOLIC PANEL
Anion gap: 10 (ref 5–15)
BUN: 8 mg/dL (ref 8–23)
CO2: 24 mmol/L (ref 22–32)
Calcium: 9.2 mg/dL (ref 8.9–10.3)
Chloride: 104 mmol/L (ref 98–111)
Creatinine, Ser: 0.57 mg/dL (ref 0.44–1.00)
GFR calc Af Amer: >60 mL/min (ref >60)
GFR calc non Af Amer: >60 mL/min (ref >60)
Glucose, Bld: 119 mg/dL — ABNORMAL HIGH (ref 70–99)
Potassium: 3.4 mmol/L — ABNORMAL LOW (ref 3.5–5.1)
Sodium: 138 mmol/L (ref 135–145)

## 2018-10-23 LAB — CBC
HCT: 39.2 % (ref 36.0–46.0)
Hemoglobin: 13.2 g/dL (ref 12.0–15.0)
MCH: 32.3 pg (ref 26.0–34.0)
MCHC: 33.7 g/dL (ref 30.0–36.0)
MCV: 95.8 fL (ref 80.0–100.0)
Platelets: 214 10*3/uL (ref 150–400)
RBC: 4.09 MIL/uL (ref 3.87–5.11)
RDW: 11.7 % (ref 11.5–15.5)
WBC: 6.1 10*3/uL (ref 4.0–10.5)
nRBC: 0 % (ref 0.0–0.2)

## 2018-10-23 LAB — TROPONIN I: Troponin I: <0.03 ng/mL (ref <0.03)

## 2018-10-23 MED ORDER — LORAZEPAM 2 MG/ML IJ SOLN
1.0000 mg | Freq: Once | INTRAMUSCULAR | Status: AC
  Administered 2018-10-23: 1 mg via INTRAVENOUS
  Filled 2018-10-23: qty 1

## 2018-10-23 MED ORDER — SODIUM CHLORIDE 0.9% FLUSH
3.0000 mL | Freq: Once | INTRAVENOUS | Status: DC
  Filled 2018-10-23: qty 3

## 2018-10-23 MED ORDER — ONDANSETRON HCL 4 MG/2ML IJ SOLN
4.0000 mg | Freq: Once | INTRAMUSCULAR | Status: AC
  Administered 2018-10-23: 22:00:00 4 mg via INTRAVENOUS
  Filled 2018-10-23: qty 2

## 2018-10-23 MED ORDER — POTASSIUM CHLORIDE CRYS ER 20 MEQ PO TBCR
40.0000 meq | EXTENDED_RELEASE_TABLET | Freq: Once | ORAL | Status: AC
  Administered 2018-10-23: 22:00:00 40 meq via ORAL
  Filled 2018-10-23: qty 2

## 2018-10-23 MED ORDER — PROMETHAZINE HCL 25 MG PO TABS
25.0000 mg | ORAL_TABLET | Freq: Once | ORAL | Status: AC
  Administered 2018-10-23: 23:00:00 25 mg via ORAL
  Filled 2018-10-23: qty 1

## 2018-10-23 MED ORDER — SODIUM CHLORIDE 0.9 % IV BOLUS
1000.0000 mL | Freq: Once | INTRAVENOUS | Status: AC
  Administered 2018-10-23: 22:00:00 1000 mL via INTRAVENOUS

## 2018-10-23 NOTE — ED Notes (Signed)
Taken to xray at this time. 

## 2018-10-23 NOTE — ED Triage Notes (Signed)
In by POV with complaints of central chest pressure that started today.  Reports having anxiety attack that started yesterday as well as some bouts of n/v/d last night.

## 2018-10-23 NOTE — ED Notes (Signed)
Pt reports she is still nauseous. EDP notified.

## 2018-10-23 NOTE — ED Provider Notes (Signed)
[facility] EMERGENCY DEPARTMENT Provider Note   CSN: [id number] Arrival date & time: 10/23/18  2047    History   Chief Complaint Chief Complaint  Patient presents with  . Chest Pain    HPI Angela Ray is a 68 y.o. female.     HPI  68 year old female presents with anxiety attack.  She states it started yesterday.  Similar to multiple prior anxiety attacks.  She is been having these for many years.  Has had vomiting, diarrhea, and feels like there are hot needles in her.  Some shortness of breath and about an hour prior to arrival developed some chest discomfort.  She has had the chest discomfort before but it does not come every time.  No blood in her emesis or diarrhea.  No fevers or cough.  She feels shaky and has a dry mouth and feels like she is dehydrated.  She did not take her evening anxiety medicines but has otherwise been taking her meds and doubled up on her clonazepam.  Past Medical History:  Diagnosis Date  . Anxiety   . Barrett's esophagus    in the past  . Colon polyp   . Depression   . Dupuytren's contracture of both hands 10/2016   sees Dr.Gramig--[facility]  . Essential tremor 2016   legs, arm,lower jaw  . Female bladder prolapse   . Fibroid   . Fibromyalgia   . GERD (gastroesophageal reflux disease)   . Heat exhaustion   . Herniated disc, cervical    C3  . Hiatal hernia   . History of cardiovascular stress test    Lexiscan Myoview 7/16:  EF 65%, inferolateral defect (prob artifactual), no ischemia  . History of recurrent UTIs   . Hyperlipidemia   . IBS (irritable bowel syndrome)    constipation  . Leg cramps   . Low back pain   . Lumbar herniated disc    L4,L5  . Osteoarthritis   . Osteopenia   . Rheumatoid arthritis, adult ([place]) 2016   both feet  . Skin cancer   . Spinal stenosis    lumbar  . Tremors of nervous system   . Vertigo     Patient Active Problem List   Diagnosis Date Noted  . Squamous cell carcinoma  in situ (SCCIS) of skin 05/24/2018  . External hemorrhoid 12/09/2016  . Globus sensation 12/09/2016  . Gas pain 12/09/2016  . Abdominal pain, epigastric 12/09/2016  . Chronic insomnia 12/04/2016  . Essential tremor 12/04/2016  . Fibromyalgia 11/25/2016  . Lumbar stenosis 11/25/2016  . Paresthesia 05/25/2015  . Tremor 05/25/2015  . IBS (irritable bowel syndrome) 02/07/2015  . Recurrent UTI 02/07/2015  . Functional constipation 12/21/2013  . Mixed incontinence 12/16/2012  . Chest pain 06/24/2011  . Anxiety 07/11/2008  . GERD 07/05/2008  . GASTRITIS 07/05/2008  . HIATAL HERNIA 07/05/2008  . GALLSTONES 07/05/2008    Past Surgical History:  Procedure Laterality Date  . ABDOMINAL HYSTERECTOMY  2003   TAH still has ovaries--[name]  . CERVICAL LAMINECTOMY  2002   C5/C6  . CHOLECYSTECTOMY  2008  . fractured ankle Left 10/2014  . ROTATOR CUFF REPAIR Right 2011     OB History    Gravida  0   Para  0   Term  0   Preterm  0   AB  0   Living  0     SAB  0   TAB  0   Ectopic  0  Multiple  0   Live Births  0            Home Medications    Prior to Admission medications   Medication Sig Start Date End Date Taking? Authorizing Provider  busPIRone (BUSPAR) 15 MG tablet Take 15 mg by mouth 2 (two) times daily.  05/16/11  Yes [provider]  cetirizine (ZYRTEC) 10 MG tablet Take 10 mg by mouth daily as needed for allergies.    Yes [provider]  clonazePAM (KLONOPIN) 0.5 MG tablet Take 0.5 mg by mouth 4 (four) times daily.  05/25/11  Yes [provider]  Cyanocobalamin (B-12) 2500 MCG TABS Take by mouth daily.   Yes [provider]  ESTRACE VAGINAL 0.1 MG/GM vaginal cream Apply 1 application topically daily. Apply small amount externally to vaginal area daily 01/31/15  Yes [provider]  fluticasone (FLONASE) 50 MCG/ACT nasal spray Place 2 sprays into both nostrils at bedtime as needed for allergies.  08/29/14  Yes  [provider]  gabapentin (NEURONTIN) 300 MG capsule TAKE 1 CAPSULE (300 MG TOTAL) BY MOUTH AT BEDTIME. 02/23/18  Yes Marcial Pacas, MD  ibuprofen (ADVIL,MOTRIN) 600 MG tablet TAKE 1 TABLET (600 MG TOTAL) BY MOUTH EVERY 8 (EIGHT) HOURS AS NEEDED (PAIN). 06/08/18  Yes Copland, Gay Filler, MD  lidocaine (XYLOCAINE) 5 % ointment Apply daily as needed 12/30/16  Yes [provider]  Multiple Vitamins-Minerals (HAIR/SKIN/NAILS/BIOTIN PO) Take by mouth daily.   Yes [provider]  OVER THE COUNTER MEDICATION Take 1 capsule by mouth 3 (three) times daily. Hydro eye   Yes [provider]  OVER THE COUNTER MEDICATION 50 mg at bedtime. Zyquil   Yes [provider]  PARoxetine (PAXIL) 20 MG tablet Take 20 mg by mouth 2 (two) times daily. 02/10/18  Yes [provider]  promethazine (PHENERGAN) 25 MG tablet Take 1 tablet (25 mg total) by mouth every 6 (six) hours as needed for nausea or vomiting. Patient taking differently: Take 25 mg by mouth as needed for nausea or vomiting.  10/12/17  Yes Copland, Gay Filler, MD  propranolol (INDERAL) 20 MG tablet Take 1 tablet (20 mg total) by mouth 2 (two) times daily. 10/08/18  Yes Copland, Gay Filler, MD  trimethoprim (TRIMPEX) 100 MG tablet Take 1 tablet by mouth daily. 01/31/15  Yes [provider]  cholestyramine (QUESTRAN) 4 g packet TAKE 1 PACKET (4 G TOTAL) BY MOUTH 2 (TWO) TIMES DAILY AS NEEDED. 08/03/18   Levin Erp, PA  cyclobenzaprine (FLEXERIL) 10 MG tablet TAKE 1 TABLET BY MOUTH TWICE A DAY AS NEEDED FOR MUSCLE SPASMS 05/21/18   Copland, Gay Filler, MD  hydrocortisone (ANUSOL-HC) 2.5 % rectal cream Place 1 application rectally 2 (two) times daily. Patient taking differently: Place 1 application rectally as needed.  12/09/16   Zehr, Laban Emperor, PA-C  LINZESS 72 MCG capsule Take 1 capsule (72 mcg total) by mouth daily before breakfast. 09/14/18   Nandigam, Venia Minks, MD  traMADol (ULTRAM) 50 MG tablet Take  1 tablet (50 mg total) by mouth every 8 (eight) hours as needed. 04/19/18   Copland, Gay Filler, MD  tolterodine (DETROL LA) 4 MG 24 hr capsule Take 4 mg by mouth daily. Needs name brand only--generic does not work for patient.  12/16/12  [provider]    Family History Family History  Problem Relation Age of Onset  . Coronary artery disease Father 55       CABG 2002, followd  by Dr, Ron Parker  . Prostate cancer Father   . Bladder Cancer Father   . Skin cancer Father   . Diabetes Father 25       DM type 2  . Atrial fibrillation Mother   . Multiple myeloma Mother   . Skin cancer Mother   . Coronary artery disease Mother   . Irritable bowel syndrome Mother   . Cancer Mother        multiple myeloma  . Diabetes Maternal Grandmother   . Hypertension Maternal Grandmother   . Diabetes Maternal Grandfather   . Hypertension Maternal Grandfather   . Osteoarthritis Paternal Grandmother   . Cancer Maternal Aunt        bone cancer  . Colon cancer Neg Hx     Social History Social History   Tobacco Use  . Smoking status: Never Smoker  . Smokeless tobacco: Never Used  Substance Use Topics  . Alcohol use: No    Alcohol/week: 0.0 standard drinks    Comment: occ glass of wine  . Drug use: No     Allergies   Aciphex [rabeprazole sodium], Bupropion hcl, Cephalexin, Codeine, Esomeprazole magnesium, Sertraline hcl, and Doxycycline hyclate   Review of Systems Review of Systems  Constitutional: Negative for fever.  Respiratory: Positive for shortness of breath. Negative for cough.   Cardiovascular: Positive for chest pain.  Gastrointestinal: Positive for nausea and vomiting. Negative for abdominal pain.  All other systems reviewed and are negative.    Physical Exam Updated Vital Signs BP 111/73   Pulse 65   Temp 98.2 F (36.8 C) (Oral)   Resp 14   Ht 5' 10.5" (1.791 m)   Wt 70.8 kg   LMP 02/09/2002 (Approximate)   SpO2 100%   BMI 22.07 kg/m   Physical Exam Vitals  signs and nursing note reviewed.  Constitutional:      General: She is not in acute distress.    Appearance: She is well-developed. She is not ill-appearing or diaphoretic.  HENT:     Head: Normocephalic and atraumatic.     Right Ear: External ear normal.     Left Ear: External ear normal.     Nose: Nose normal.  Eyes:     General:        Right eye: No discharge.        Left eye: No discharge.  Cardiovascular:     Rate and Rhythm: Normal rate and regular rhythm.     Heart sounds: Normal heart sounds.  Pulmonary:     Effort: Pulmonary effort is normal.     Breath sounds: Normal breath sounds.  Abdominal:     Palpations: Abdomen is soft.     Tenderness: There is no abdominal tenderness.  Skin:    General: Skin is warm and dry.  Neurological:     Mental Status: She is alert.  Psychiatric:        Behavior: Behavior is not agitated.      ED Treatments / Results  Labs (all labs ordered are listed, but only abnormal results are displayed) Labs Reviewed  BASIC METABOLIC PANEL - Abnormal; Notable for the following components:      Result Value   Potassium 3.4 (*)    Glucose, Bld 119 (*)    All other components within normal limits  CBC  TROPONIN I  TROPONIN I    EKG EKG Interpretation  Date/Time:  Saturday October 23 2018 20:54:53 EDT Ventricular Rate:  67 PR Interval:  QRS Duration: 105 QT Interval:  444 QTC Calculation: 469 R Axis:   98 Text Interpretation:  Sinus rhythm Right axis deviation Low voltage, precordial leads no significant change since May 2019 Confirmed by Sherwood Gambler [phone]) on 10/23/2018 9:02:14 PM   Radiology Dg Chest 2 View  Result Date: 10/23/2018 CLINICAL DATA:  Acute chest pain EXAM: CHEST - 2 VIEW COMPARISON:  10/14/2014 FINDINGS: Calcified granuloma in the right mid lung posteriorly. Heart is normal size. No confluent airspace opacities or effusions. No acute bony abnormality. IMPRESSION: No active cardiopulmonary disease. Electronically  Signed   By: Rolm Baptise M.D.   On: 10/23/2018 21:44    Procedures Procedures (including critical care time)  Medications Ordered in ED Medications  sodium chloride flush (NS) 0.9 % injection 3 mL (3 mLs Intravenous Not Given 10/23/18 2220)  ondansetron (ZOFRAN) injection 4 mg (4 mg Intravenous Given 10/23/18 2138)  sodium chloride 0.9 % bolus 1,000 mL (1,000 mLs Intravenous New Bag/Given 10/23/18 2139)  LORazepam (ATIVAN) injection 1 mg (1 mg Intravenous Given 10/23/18 2140)  potassium chloride SA (K-DUR) CR tablet 40 mEq (40 mEq Oral Given 10/23/18 2219)  ondansetron (ZOFRAN) injection 4 mg (4 mg Intravenous Given 10/23/18 2221)     Initial Impression / Assessment and Plan / ED Course  I have reviewed the triage vital signs and the nursing notes.  Pertinent labs & imaging results that were available during my care of the patient were reviewed by me and considered in my medical decision making (see chart for details).        Patient is feeling a little better though still little nauseated.  However this is improved.  Lab work is pretty unremarkable besides mild hypokalemia which is likely from the diarrhea.  This sounds very similar to multiple prior anxiety attacks for her.  However given her age and think is reasonable to get a second troponin, especially since the chest pain was a late component.  If this is negative I think she could be discharged home.  Care transferred to Dr. Randal Buba  Final Clinical Impressions(s) / ED Diagnoses   Final diagnoses:  None    ED Discharge Orders    None       Sherwood Gambler, MD 10/23/18 2314

## 2018-10-24 DIAGNOSIS — R072 Precordial pain: Secondary | ICD-10-CM | POA: Diagnosis not present

## 2018-10-24 LAB — TROPONIN I: Troponin I: <0.03 ng/mL (ref <0.03)

## 2018-10-25 ENCOUNTER — Encounter: Payer: Self-pay | Admitting: Family Medicine

## 2018-11-15 MED FILL — busPIRone HCL 30 MG TABS: 30 | 90 days supply | Qty: 90 | Fill #0

## 2018-12-06 ENCOUNTER — Encounter: Payer: Self-pay | Admitting: Obstetrics and Gynecology

## 2018-12-06 DIAGNOSIS — M25552 Pain in left hip: Secondary | ICD-10-CM | POA: Diagnosis not present

## 2018-12-06 DIAGNOSIS — R634 Abnormal weight loss: Secondary | ICD-10-CM | POA: Diagnosis not present

## 2018-12-06 DIAGNOSIS — Z90710 Acquired absence of both cervix and uterus: Secondary | ICD-10-CM | POA: Diagnosis not present

## 2018-12-06 DIAGNOSIS — Z1231 Encounter for screening mammogram for malignant neoplasm of breast: Secondary | ICD-10-CM | POA: Diagnosis not present

## 2018-12-06 DIAGNOSIS — M810 Age-related osteoporosis without current pathological fracture: Secondary | ICD-10-CM | POA: Diagnosis not present

## 2018-12-06 DIAGNOSIS — K589 Irritable bowel syndrome without diarrhea: Secondary | ICD-10-CM | POA: Diagnosis not present

## 2018-12-22 MED FILL — TRIMETHOPRIM 100 MG TABLET: 100 | 90 days supply | Qty: 90 | Fill #0

## 2018-12-26 ENCOUNTER — Other Ambulatory Visit: Payer: Self-pay | Admitting: Gastroenterology

## 2018-12-27 ENCOUNTER — Telehealth: Payer: Self-pay | Admitting: Obstetrics and Gynecology

## 2018-12-27 NOTE — Telephone Encounter (Signed)
Please contact patient in follow up to her BMD from [facility].   The BMD shows normal values, but she still has a diagnosis of osteoporosis based on her fragility fracture.   She has already had consultation with Dr. [name] about her bone health.  I recommend weight bearing exercise, 1200 mg calcium daily, vitamin D 600 - 800 IU daily, and repeat BMD in 2 years.

## 2018-12-28 DIAGNOSIS — N390 Urinary tract infection, site not specified: Secondary | ICD-10-CM | POA: Diagnosis not present

## 2018-12-30 NOTE — Telephone Encounter (Signed)
Spoke with patient and reviewed BMD results per Dr.Silva's note below. She voiced understanding.

## 2019-01-12 DIAGNOSIS — L239 Allergic contact dermatitis, unspecified cause: Secondary | ICD-10-CM | POA: Diagnosis not present

## 2019-01-18 ENCOUNTER — Other Ambulatory Visit: Payer: Self-pay | Admitting: Gastroenterology

## 2019-02-03 DIAGNOSIS — H2513 Age-related nuclear cataract, bilateral: Secondary | ICD-10-CM | POA: Diagnosis not present

## 2019-02-03 DIAGNOSIS — H16223 Keratoconjunctivitis sicca, not specified as Sjogren's, bilateral: Secondary | ICD-10-CM | POA: Diagnosis not present

## 2019-02-13 MED FILL — busPIRone HCL 30 MG TABS: 30 | 90 days supply | Qty: 90 | Fill #1

## 2019-03-17 DIAGNOSIS — H16223 Keratoconjunctivitis sicca, not specified as Sjogren's, bilateral: Secondary | ICD-10-CM | POA: Diagnosis not present

## 2019-03-17 DIAGNOSIS — H2513 Age-related nuclear cataract, bilateral: Secondary | ICD-10-CM | POA: Diagnosis not present

## 2019-04-01 ENCOUNTER — Ambulatory Visit: Payer: Medicare Other | Admitting: Obstetrics and Gynecology

## 2019-04-11 ENCOUNTER — Other Ambulatory Visit: Payer: Self-pay

## 2019-04-11 ENCOUNTER — Encounter: Payer: Self-pay | Admitting: Obstetrics and Gynecology

## 2019-04-11 ENCOUNTER — Ambulatory Visit (INDEPENDENT_AMBULATORY_CARE_PROVIDER_SITE_OTHER): Payer: Medicare Other | Admitting: Obstetrics and Gynecology

## 2019-04-11 VITALS — BP 118/76 | HR 60 | Temp 96.4°F | Resp 14 | Ht 70.25 in | Wt 157.2 lb

## 2019-04-11 DIAGNOSIS — Z01419 Encounter for gynecological examination (general) (routine) without abnormal findings: Secondary | ICD-10-CM | POA: Diagnosis not present

## 2019-04-11 DIAGNOSIS — Z124 Encounter for screening for malignant neoplasm of cervix: Secondary | ICD-10-CM

## 2019-04-11 NOTE — Patient Instructions (Signed)

## 2019-04-11 NOTE — Progress Notes (Signed)
68 y.o. G0P0000 Married Caucasian female here for annual exam.    Patient is seeing Dr.Elliott and being treated for PTSD. She was having anxiety attacks. She is taking Paxil, and sees Dr. Toy Care, who referred her to specialist for PTSD.  Husband is treating for prostate cancer.  Still having constipation.  Taking Linzess.   Seeing [name] for daily UTI prevention. Taking Trimethoprim.   Using daily vaginal estrogen from [name].  She also has lidocaine for use if needed.  PCP:  Lamar Blinks, MD   Patient's last menstrual period was 02/09/2002 (approximate).           Sexually active: No. Husband w/prostate cancer The current method of family planning is status post hysterectomy.    Exercising: Yes.    walks every night Smoker:  no  Health Maintenance: Pap:  11-20-10 Neg History of abnormal Pap:  no MMG: 12-06-18 Neg/density B/BiRads1  Colonoscopy: 04/04/15 Tubular Adenoma;GI is following. BMD: 12-06-18  Result:  normal but still with dx of osteoporosis based on fragility fracture. TDaP:  05/2014 Gardasil:   no HIV:02-22-16 NR Hep C:02-22-16 Neg Screening Labs:  PCP.  Flu vaccine:  Completed.    reports that she has never smoked. She has never used smokeless tobacco. She reports that she does not drink alcohol or use drugs.  Past Medical History:  Diagnosis Date  . Anxiety   . Barrett's esophagus    in the past  . Colon polyp   . Depression   . Dry eye syndrome of both eyes   . Dupuytren's contracture of both hands 10/2016   sees Dr.Gramig--[facility]  . Essential tremor 2016   legs, arm,lower jaw  . Female bladder prolapse   . Fibroid   . Fibromyalgia   . GERD (gastroesophageal reflux disease)   . Heat exhaustion   . Herniated disc, cervical    C3  . Hiatal hernia   . History of cardiovascular stress test    Lexiscan Myoview 7/16:  EF 65%, inferolateral defect (prob artifactual), no ischemia  . History of recurrent UTIs   . Hyperlipidemia   . IBS  (irritable bowel syndrome)    constipation  . Leg cramps   . Low back pain   . Lumbar herniated disc    L4,L5  . Osteoarthritis   . Osteopenia   . Rheumatoid arthritis, adult ([place]) 2016   both feet  . Skin cancer   . Spinal stenosis    lumbar  . Tremors of nervous system   . Vertigo     Past Surgical History:  Procedure Laterality Date  . ABDOMINAL HYSTERECTOMY  2003   TAH still has ovaries--[name]  . CERVICAL LAMINECTOMY  2002   C5/C6  . CHOLECYSTECTOMY  2008  . fractured ankle Left 10/2014  . ROTATOR CUFF REPAIR Right 2011    Current Outpatient Medications  Medication Sig Dispense Refill  . Apoaequorin (PREVAGEN) 10 MG CAPS Take 1 tablet by mouth daily.    . Azelaic Acid 15 % cream Apply 1 application topically every morning.    . busPIRone (BUSPAR) 30 MG tablet Takes 1/2 tablet bid    . cetirizine (ZYRTEC) 10 MG tablet Take 10 mg by mouth daily as needed for allergies.     . cholestyramine (QUESTRAN) 4 g packet TAKE 1 PACKET (4 G TOTAL) BY MOUTH 2 (TWO) TIMES DAILY AS NEEDED. 30 packet 1  . clonazePAM (KLONOPIN) 1 MG tablet Take 1 tablet by mouth 3 (three) times daily.    Marland Kitchen  Cyanocobalamin (B-12) 2500 MCG TABS Take by mouth daily.    . cyclobenzaprine (FLEXERIL) 10 MG tablet TAKE 1 TABLET BY MOUTH TWICE A DAY AS NEEDED FOR MUSCLE SPASMS 30 tablet 2  . ESTRACE VAGINAL 0.1 MG/GM vaginal cream Apply 1 application topically daily. Apply small amount externally to vaginal area daily    . fluticasone (CUTIVATE) 0.05 % cream Apply 1 application topically 2 (two) times daily.    . fluticasone (FLONASE) 50 MCG/ACT nasal spray Place 2 sprays into both nostrils at bedtime as needed for allergies.   4  . gabapentin (NEURONTIN) 300 MG capsule TAKE 1 CAPSULE (300 MG TOTAL) BY MOUTH AT BEDTIME. 30 capsule 0  . hydrocortisone (ANUSOL-HC) 2.5 % rectal cream Place 1 application rectally 2 (two) times daily. (Patient taking differently: Place 1 application rectally as needed. ) 30 g 1  .  ibuprofen (ADVIL,MOTRIN) 600 MG tablet TAKE 1 TABLET (600 MG TOTAL) BY MOUTH EVERY 8 (EIGHT) HOURS AS NEEDED (PAIN). 40 tablet 1  . lidocaine (XYLOCAINE) 5 % ointment Apply daily as needed    . Lifitegrast (XIIDRA) 5 % SOLN Apply 1 drop to eye daily.    Marland Kitchen LINZESS 72 MCG capsule TAKE 1 CAPSULE (72 MCG TOTAL) BY MOUTH DAILY BEFORE BREAKFAST. 90 capsule 0  . Multiple Vitamins-Minerals (HAIR/SKIN/NAILS/BIOTIN PO) Take by mouth daily.    . ondansetron (ZOFRAN-ODT) 4 MG disintegrating tablet Take 4 mg by mouth 3 (three) times daily.    Marland Kitchen OVER THE COUNTER MEDICATION Take 1 capsule by mouth 3 (three) times daily. Hydro eye    . OVER THE COUNTER MEDICATION 50 mg at bedtime. Zyquil    . PARoxetine (PAXIL) 20 MG tablet Take 20 mg by mouth 2 (two) times daily.  4  . promethazine (PHENERGAN) 25 MG tablet Take 1 tablet (25 mg total) by mouth every 6 (six) hours as needed for nausea or vomiting. (Patient taking differently: Take 25 mg by mouth as needed for nausea or vomiting. ) 30 tablet 0  . propranolol (INDERAL) 20 MG tablet Take 1 tablet (20 mg total) by mouth 2 (two) times daily. 180 tablet 3  . traMADol (ULTRAM) 50 MG tablet Take 1 tablet (50 mg total) by mouth every 8 (eight) hours as needed. 40 tablet 1  . tretinoin (RETIN-A) 0.025 % cream Apply 1 application topically at bedtime.    Marland Kitchen trimethoprim (TRIMPEX) 100 MG tablet Take 1 tablet by mouth daily.     No current facility-administered medications for this visit.     Family History  Problem Relation Age of Onset  . Coronary artery disease Father 28       CABG 2002, followd by Dr, Ron Parker  . Prostate cancer Father   . Bladder Cancer Father   . Skin cancer Father   . Diabetes Father 28       DM type 2  . Atrial fibrillation Mother   . Multiple myeloma Mother   . Skin cancer Mother   . Coronary artery disease Mother   . Irritable bowel syndrome Mother   . Cancer Mother        multiple myeloma  . Diabetes Maternal Grandmother   . Hypertension  Maternal Grandmother   . Diabetes Maternal Grandfather   . Hypertension Maternal Grandfather   . Osteoarthritis Paternal Grandmother   . Cancer Maternal Aunt        bone cancer  . Colon cancer Neg Hx     Review of Systems  Psychiatric/Behavioral:  Anxiety, PTSD--sees psychiatrist  All other systems reviewed and are negative.   Exam:   BP 118/76   Pulse 60   Temp (!) 96.4 F (35.8 C) (Temporal)   Resp 14   Ht 5' 10.25" (1.784 m)   Wt 157 lb 3.2 oz (71.3 kg)   LMP 02/09/2002 (Approximate)   BMI 22.40 kg/m     General appearance: alert, cooperative and appears stated age Head: normocephalic, without obvious abnormality, atraumatic Neck: no adenopathy, supple, symmetrical, trachea midline and thyroid normal to inspection and palpation Lungs: clear to auscultation bilaterally Breasts: normal appearance, no masses or tenderness, No nipple retraction or dimpling, No nipple discharge or bleeding, No axillary adenopathy Heart: regular rate and rhythm Abdomen: soft, non-tender; no masses, no organomegaly Extremities: extremities normal, atraumatic, no cyanosis or edema Skin: skin color, texture, turgor normal. No rashes or lesions Lymph nodes: cervical, supraclavicular, and axillary nodes normal. Neurologic: grossly normal  Pelvic: External genitalia:  no lesions              No abnormal inguinal nodes palpated.              Urethra:  normal appearing urethra with no masses, tenderness or lesions              Bartholins and Skenes: normal                 Vagina: normal appearing vagina with normal color and discharge, no lesions              Cervix:  absent              Pap taken: No. Bimanual Exam:  Uterus:  absent              Adnexa: no mass, fullness, tenderness              Rectal exam: Yes.  .  Confirms.              Anus:  normal sphincter tone, no lesions  Chaperone was present for exam.  Assessment:   Well woman visit with normal exam. Status post TAH.   Ovaries remain.  Hx fragility fracture and normal bone density.  Osteoporosis by definition.  Elevated cholesterol. Recurrent UTI. Tx through [name]. Anxiety and panic.  Plan: Mammogram screening discussed. Self breast awareness reviewed. Pap and HR HPV as above. Guidelines for Calcium, Vitamin D, regular exercise program including cardiovascular and weight bearing exercise.   Follow up annually and prn.    After visit summary provided.

## 2019-05-02 DIAGNOSIS — F4310 Post-traumatic stress disorder, unspecified: Secondary | ICD-10-CM | POA: Diagnosis not present

## 2019-05-10 DIAGNOSIS — Z23 Encounter for immunization: Secondary | ICD-10-CM | POA: Diagnosis not present

## 2019-05-14 MED FILL — BUSPIRONE HCL 30 MG TABS: 30 | 90 days supply | Qty: 90 | Fill #2

## 2019-05-23 ENCOUNTER — Encounter: Payer: Self-pay | Admitting: Family Medicine

## 2019-05-23 ENCOUNTER — Other Ambulatory Visit: Payer: Self-pay | Admitting: Family Medicine

## 2019-05-23 ENCOUNTER — Telehealth: Payer: Self-pay | Admitting: Family Medicine

## 2019-05-23 MED FILL — TRIMETHOPRIM 100 MG TABLET: 100 | 90 days supply | Qty: 90 | Fill #1

## 2019-05-23 NOTE — Telephone Encounter (Signed)
Called and the patient has been having diarrhea for almost one week.  She is having to wear depends, unable to leave the house and the odor she stated is awful. She has had this problem over a year ago. I have scheduled her a virtual on Tuesday 1/12/2021with Dr. Nani Ravens but wanted to inform you of symptoms as well.

## 2019-05-23 NOTE — Telephone Encounter (Signed)
Copied from [place] [phone]. Topic: General - Other >> May 23, 2019 11:57 AM Keene Breath wrote: Reason for CRM: Patient would like the nurse to call patient regarding her diarrhea that she has had for a few weeks.  Patient stated that she has had it before and would like some medication sent to local pharmacy.  CB# [phone]

## 2019-05-23 NOTE — Telephone Encounter (Signed)
Last seen by myself in January of 2020 for a leg pain  Gave her a call back She was feeling constipated about 10 days ago so she took miralax - double dose daily for 3.  This then triggered diarrhea, which has been going on for the last week. She has noted issues with very loose stools and accidents-if she goes to pass gas she does not know if she will have gas or diarrhea She notes foul-smelling gas She had a bowel pathology last February for a similar problem, and then saw gastroenterology in March It looks like she was recommended to take align as a probiotic She then called back in May and was given a prescription for Linzess-she wonders about taking this again, but I do not think this would be the best choice for her current diarrhea  Advised patient that I would defer management of this issue to her gastroenterologist who has already seen her for this.  I have not seen her in over a year I will send a message to her gastroenterology physician and PA and request their advice

## 2019-05-24 ENCOUNTER — Other Ambulatory Visit: Payer: Self-pay | Admitting: Gastroenterology

## 2019-05-24 ENCOUNTER — Encounter: Payer: Self-pay | Admitting: Family Medicine

## 2019-05-24 ENCOUNTER — Other Ambulatory Visit: Payer: Self-pay

## 2019-05-24 ENCOUNTER — Telehealth: Payer: Self-pay

## 2019-05-24 ENCOUNTER — Ambulatory Visit (INDEPENDENT_AMBULATORY_CARE_PROVIDER_SITE_OTHER): Payer: Medicare Other | Admitting: Family Medicine

## 2019-05-24 VITALS — Temp 97.8°F

## 2019-05-24 DIAGNOSIS — R197 Diarrhea, unspecified: Secondary | ICD-10-CM | POA: Diagnosis not present

## 2019-05-24 MED ORDER — CHOLESTYRAMINE 4 G PO PACK: 4.0000 g | PACK | Freq: Two times a day (BID) | ORAL | 1 refills | Status: DC | PRN

## 2019-05-24 NOTE — Telephone Encounter (Signed)
Called patient, and she is still having explosive diarrhea. Going on for 1 week. It is mushy, no blood. No n/v. No fever. Said she was severely constipated  And took a double dose of Miralax for 3 days. When that broke loose, she has now had the diarrhea for 1 week. She can not even leave the house without a depends, because she has stool incontinence. Scheduled office visit with Ellouise Newer PA on 05/26/19

## 2019-05-24 NOTE — Telephone Encounter (Signed)
See phone note

## 2019-05-24 NOTE — Progress Notes (Signed)
Chief Complaint  Patient presents with  . Diarrhea    Subjective: Patient is a 69 y.o. female here for diarrhea. Due to COVID-19 pandemic, we are interacting via web portal for an electronic face-to-face visit. I verified patient's ID using 2 identifiers. Patient agreed to proceed with visit via this method. Patient is at home, I am at office. Patient, husband Nicole Kindred and I are present for visit.   8 d of diarrhea. Hx of IBS-C, was using MiraLAX for constipation and had a blow out. No bleeding. No pain, lots of rumbling. Has not contact GI. Tried to increase fiber intake w food. Denies fevers or sick contacts, no URI s/ss.   ROS: GI: As noted in HPI Lungs: Denies SOB   Past Medical History:  Diagnosis Date  . Anxiety   . Barrett's esophagus    in the past  . Colon polyp   . Depression   . Dry eye syndrome of both eyes   . Dupuytren's contracture of both hands 10/2016   sees Dr.Gramig--[facility]  . Essential tremor 2016   legs, arm,lower jaw  . Female bladder prolapse   . Fibroid   . Fibromyalgia   . GERD (gastroesophageal reflux disease)   . Heat exhaustion   . Herniated disc, cervical    C3  . Hiatal hernia   . History of cardiovascular stress test    Lexiscan Myoview 7/16:  EF 65%, inferolateral defect (prob artifactual), no ischemia  . History of recurrent UTIs   . Hyperlipidemia   . IBS (irritable bowel syndrome)    constipation  . Leg cramps   . Low back pain   . Lumbar herniated disc    L4,L5  . Osteoarthritis   . Osteopenia   . Rheumatoid arthritis, adult ([place]) 2016   both feet  . Skin cancer   . Spinal stenosis    lumbar  . Tremors of nervous system   . Vertigo     Objective: Temp 97.8 F (36.6 C) (Temporal)   LMP 02/09/2002 (Approximate)  No conversational dyspnea Age appropriate judgment and insight Nml affect and mood  Assessment and Plan: Diarrhea, unspecified type - Plan: cholestyramine (QUESTRAN) 4 g packet  Add above and Metamucil.  Stay hydrated. If no improvement, call GI. F/u prn.  The patient voiced understanding and agreement to the plan.  [place], DO 05/24/19  3:31 PM

## 2019-05-24 NOTE — Telephone Encounter (Signed)
Pt returned your call. Pls call her again. °

## 2019-05-24 NOTE — Telephone Encounter (Signed)
Left message for patient to please call back. 

## 2019-05-24 NOTE — Telephone Encounter (Signed)
-----   Message from Levin Erp, [place] sent at 05/23/2019  3:57 PM EST ----- Regarding: Can you call patient and see if she needs OV? Can you call and ask about diarrhea? See if she needs OV?  Thanks-JLL ----- Message ----- From: Darreld Mclean, MD Sent: 05/23/2019   3:43 PM EST To: Mauri Pole, MD, #  Hi-I wondered if either of you would be able to have your staff contact this patient with concern of recurrent diarrhea for about 1 week   She was seen by [facility] in March  2020 and had a telephone exchange in May for diarrhea/constipation.  She is calling me now with diarrhea similar to what she had last year.  I have not seen her in over a year myself, and never for this issue so I suggested that she follow-up with your office Thank you so much  Jess Copland

## 2019-05-26 ENCOUNTER — Encounter: Payer: Self-pay | Admitting: Physician Assistant

## 2019-05-26 ENCOUNTER — Ambulatory Visit: Payer: Medicare Other | Admitting: Physician Assistant

## 2019-05-26 ENCOUNTER — Ambulatory Visit (INDEPENDENT_AMBULATORY_CARE_PROVIDER_SITE_OTHER): Payer: Medicare Other | Admitting: Physician Assistant

## 2019-05-26 VITALS — BP 110/70 | HR 69 | Temp 96.5°F | Ht 70.5 in | Wt 155.0 lb

## 2019-05-26 DIAGNOSIS — Z01818 Encounter for other preprocedural examination: Secondary | ICD-10-CM

## 2019-05-26 DIAGNOSIS — K625 Hemorrhage of anus and rectum: Secondary | ICD-10-CM | POA: Diagnosis not present

## 2019-05-26 DIAGNOSIS — R194 Change in bowel habit: Secondary | ICD-10-CM

## 2019-05-26 MED ORDER — NA SULFATE-K SULFATE-MG SULF 17.5-3.13-1.6 GM/177ML PO SOLN: 1.0000 | Freq: Once | ORAL | 0 refills | Status: AC

## 2019-05-26 NOTE — Patient Instructions (Addendum)
If you are age 67 or older, your body mass index should be between 23-30. Your Body mass index is 21.93 kg/m. If this is out of the aforementioned range listed, please consider follow up with your Primary Care Provider.  If you are age 54 or younger, your body mass index should be between 19-25. Your Body mass index is 21.93 kg/m. If this is out of the aformentioned range listed, please consider follow up with your Primary Care Provider.   You have been scheduled for a colonoscopy. Please follow written instructions given to you at your visit today.  Please pick up your prep supplies at the pharmacy within the next 1-3 days. If you use inhalers (even only as needed), please bring them with you on the day of your procedure. Your physician has requested that you go to www.startemmi.com and enter the access code given to you at your visit today. This web site gives a general overview about your procedure. However, you should still follow specific instructions given to you by our office regarding your preparation for the procedure.  It was a pleasure to see you today!  Ellouise Newer, PA-C
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
## 2019-05-31 ENCOUNTER — Other Ambulatory Visit: Payer: Self-pay | Admitting: Family Medicine

## 2019-05-31 DIAGNOSIS — R197 Diarrhea, unspecified: Secondary | ICD-10-CM

## 2019-05-31 NOTE — Progress Notes (Signed)
Reviewed and agree with documentation and assessment and plan. K. Veena Burley [place] , MD   

## 2019-06-01 DIAGNOSIS — F4310 Post-traumatic stress disorder, unspecified: Secondary | ICD-10-CM | POA: Diagnosis not present

## 2019-06-03 ENCOUNTER — Ambulatory Visit (INDEPENDENT_AMBULATORY_CARE_PROVIDER_SITE_OTHER): Payer: Medicare Other

## 2019-06-03 ENCOUNTER — Other Ambulatory Visit: Payer: Self-pay | Admitting: Gastroenterology

## 2019-06-03 DIAGNOSIS — Z1159 Encounter for screening for other viral diseases: Secondary | ICD-10-CM | POA: Diagnosis not present

## 2019-06-06 LAB — SARS CORONAVIRUS 2 (TAT 6-24 HRS): SARS Coronavirus 2: NEGATIVE

## 2019-06-07 ENCOUNTER — Encounter: Payer: Self-pay | Admitting: Gastroenterology

## 2019-06-07 ENCOUNTER — Ambulatory Visit (AMBULATORY_SURGERY_CENTER): Payer: Medicare Other | Admitting: Gastroenterology

## 2019-06-07 ENCOUNTER — Other Ambulatory Visit: Payer: Self-pay

## 2019-06-07 VITALS — BP 120/76 | HR 70 | Temp 95.3°F | Resp 16 | Ht 70.0 in | Wt 155.0 lb

## 2019-06-07 DIAGNOSIS — K625 Hemorrhage of anus and rectum: Secondary | ICD-10-CM

## 2019-06-07 DIAGNOSIS — D122 Benign neoplasm of ascending colon: Secondary | ICD-10-CM

## 2019-06-07 DIAGNOSIS — R197 Diarrhea, unspecified: Secondary | ICD-10-CM

## 2019-06-07 DIAGNOSIS — M797 Fibromyalgia: Secondary | ICD-10-CM | POA: Diagnosis not present

## 2019-06-07 DIAGNOSIS — F329 Major depressive disorder, single episode, unspecified: Secondary | ICD-10-CM | POA: Diagnosis not present

## 2019-06-07 DIAGNOSIS — D123 Benign neoplasm of transverse colon: Secondary | ICD-10-CM | POA: Diagnosis not present

## 2019-06-07 DIAGNOSIS — R194 Change in bowel habit: Secondary | ICD-10-CM | POA: Diagnosis not present

## 2019-06-07 DIAGNOSIS — M069 Rheumatoid arthritis, unspecified: Secondary | ICD-10-CM | POA: Diagnosis not present

## 2019-06-07 DIAGNOSIS — F419 Anxiety disorder, unspecified: Secondary | ICD-10-CM | POA: Diagnosis not present

## 2019-06-07 MED ORDER — COLESEVELAM HCL 625 MG PO TABS: 625.0000 mg | ORAL_TABLET | Freq: Two times a day (BID) | ORAL | 3 refills | Status: DC

## 2019-06-07 MED ORDER — SODIUM CHLORIDE 0.9 % IV SOLN: 500.0000 mL | Freq: Once | INTRAVENOUS | Status: DC

## 2019-06-07 NOTE — Progress Notes (Signed)
Temp-JB VS-CW  Pt's states no medical or surgical changes since previsit or office visit.  

## 2019-06-07 NOTE — Progress Notes (Signed)
Report to PACU, RN, vss, BBS= Clear.  

## 2019-06-07 NOTE — Patient Instructions (Signed)
YOU HAD AN ENDOSCOPIC PROCEDURE TODAY AT [facility]:   Refer to the procedure report that was given to you for any specific questions about what was found during the examination.  If the procedure report does not answer your questions, please call your gastroenterologist to clarify.  If you requested that your care partner not be given the details of your procedure findings, then the procedure report has been included in a sealed envelope for you to review at your convenience later.  YOU SHOULD EXPECT: Some feelings of bloating in the abdomen. Passage of more gas than usual.  Walking can help get rid of the air that was put into your GI tract during the procedure and reduce the bloating. If you had a lower endoscopy (such as a colonoscopy or flexible sigmoidoscopy) you may notice spotting of blood in your stool or on the toilet paper. If you underwent a bowel prep for your procedure, you may not have a normal bowel movement for a few days.  Please Note:  You might notice some irritation and congestion in your nose or some drainage.  This is from the oxygen used during your procedure.  There is no need for concern and it should clear up in a day or so.  SYMPTOMS TO REPORT IMMEDIATELY:   Following lower endoscopy (colonoscopy or flexible sigmoidoscopy):  Excessive amounts of blood in the stool  Significant tenderness or worsening of abdominal pains  Swelling of the abdomen that is new, acute  Fever of 100F or higher   For urgent or emergent issues, a gastroenterologist can be reached at any hour by calling [phone].   DIET:  We do recommend a small meal at first, but then you may proceed to your regular diet.  Drink plenty of fluids but you should avoid alcoholic beverages for 24 hours.  MEDICATIONS: Continue present medications. Stop Cholestyramine and Linzess. Start Welchol 1 tablet by mouth twice daily, avoid taking it within 3 hours of other medication to prevent drug  interactions. Also use Benefiber 1 tablespoon twice daily with meals (this medication is over the counter).  Follow Up: Follow up with Dr. Silverio Decamp in her office in 4-6 weeks.  Please see handouts given to you by your recovery nurse.  ACTIVITY:  You should plan to take it easy for the rest of today and you should NOT DRIVE or use heavy machinery until tomorrow (because of the sedation medicines used during the test).    FOLLOW UP: Our staff will call the number listed on your records 48-72 hours following your procedure to check on you and address any questions or concerns that you may have regarding the information given to you following your procedure. If we do not reach you, we will leave a message.  We will attempt to reach you two times.  During this call, we will ask if you have developed any symptoms of COVID 19. If you develop any symptoms (ie: fever, flu-like symptoms, shortness of breath, cough etc.) before then, please call [phone].  If you test positive for Covid 19 in the 2 weeks post procedure, please call and report this information to [place].    If any biopsies were taken you will be contacted by phone or by letter within the next 1-3 weeks.  Please call us at [phone] if you have not heard about the biopsies in 3 weeks.   Thank you for allowing [place] to provide for your healthcare needs today.   SIGNATURES/CONFIDENTIALITY:  You and/or your care partner have signed paperwork which will be entered into your electronic medical record.  These signatures attest to the fact that that the information above on your After Visit Summary has been reviewed and is understood.  Full responsibility of the confidentiality of this discharge information lies with you and/or your care-partner.

## 2019-06-07 NOTE — Op Note (Signed)
[facility] Patient Name: Angela Ray Procedure Date: 06/07/2019 7:26 AM MRN: [id number] Endoscopist: Mauri Pole , MD Age: 69 Referring MD:  Date of Birth: 12-09-1950 Gender: Female Account #: [id number] Procedure:                Colonoscopy Indications:              Evaluation of unexplained GI bleeding presenting                            with Hematochezia, Clinically significant                            intermittent diarrhea of unexplained origin, Change                            in bowel habits Medicines:                Monitored Anesthesia Care Procedure:                Pre-Anesthesia Assessment:                           - Prior to the procedure, a History and Physical                            was performed, and patient medications and                            allergies were reviewed. The patient's tolerance of                            previous anesthesia was also reviewed. The risks                            and benefits of the procedure and the sedation                            options and risks were discussed with the patient.                            All questions were answered, and informed consent                            was obtained. Prior Anticoagulants: The patient has                            taken no previous anticoagulant or antiplatelet                            agents. ASA Grade Assessment: II - A patient with                            mild systemic disease. After reviewing the risks  and benefits, the patient was deemed in                            satisfactory condition to undergo the procedure.                           After obtaining informed consent, the colonoscope                            was passed under direct vision. Throughout the                            procedure, the patient's blood pressure, pulse, and                            oxygen saturations were monitored  continuously. The                            Colonoscope was introduced through the anus and                            advanced to the the terminal ileum, with                            identification of the appendiceal orifice and IC                            valve. The colonoscopy was performed without                            difficulty. The patient tolerated the procedure                            well. The quality of the bowel preparation was                            adequate. The ileocecal valve, appendiceal orifice,                            and rectum were photographed. Scope In: 8:11:14 AM Scope Out: 8:35:07 AM Scope Withdrawal Time: 0 hours 15 minutes 38 seconds  Total Procedure Duration: 0 hours 23 minutes 53 seconds  Findings:                 The perianal and digital rectal examinations were                            normal.                           Two sessile polyps were found in the transverse                            colon. The polyps were 1 to 2 mm in size. These  polyps were removed with a cold biopsy forceps.                            Resection and retrieval were complete.                           A 3 mm polyp was found in the ascending colon. The                            polyp was sessile. The polyp was removed with a                            cold snare. Resection and retrieval were complete.                           The colon (entire examined portion) appeared                            normal. Biopsies for histology were taken with a                            cold forceps from the right colon and left colon                            for evaluation of microscopic colitis.                           Non-bleeding internal hemorrhoids were found during                            retroflexion. The hemorrhoids were medium-sized.                           The exam was otherwise without abnormality. Complications:            No  immediate complications. Estimated Blood Loss:     Estimated blood loss was minimal. Impression:               - Two 1 to 2 mm polyps in the transverse colon,                            removed with a cold biopsy forceps. Resected and                            retrieved.                           - One 3 mm polyp in the ascending colon, removed                            with a cold snare. Resected and retrieved.                           - The entire examined colon is normal. Biopsied.                           -  Non-bleeding internal hemorrhoids.                           - The examination was otherwise normal. Recommendation:           - Patient has a contact number available for                            emergencies. The signs and symptoms of potential                            delayed complications were discussed with the                            patient. Return to normal activities tomorrow.                            Written discharge instructions were provided to the                            patient.                           - Resume previous diet.                           - Continue present medications.                           - Await pathology results.                           - Repeat colonoscopy in 3 - 5 years for                            surveillance based on pathology results.                           - Stop Cholestyramine and Linzess                           -Start Welchol 1 tablet 625mg  twice daily, avoid                            taking it within 3 hours of other medication to                            prevent drug interaction                           -Benefiber 1 tablespoon BID with meals                           -Follow up office visit in 4-6 weeks Mauri Pole, MD 06/07/2019 8:45:06 AM This report has been signed electronically.

## 2019-06-07 NOTE — Progress Notes (Signed)
Called to room to assist during endoscopic procedure.  Patient ID and intended procedure confirmed with present staff. Received instructions for my participation in the procedure from the performing physician.  

## 2019-06-09 ENCOUNTER — Telehealth: Payer: Self-pay | Admitting: *Deleted

## 2019-06-09 ENCOUNTER — Telehealth: Payer: Self-pay | Admitting: Gastroenterology

## 2019-06-09 NOTE — Telephone Encounter (Signed)
  Follow up Call-  Call back number 06/07/2019  Post procedure Call Back phone  # [phone]  Permission to leave phone message Yes  Some recent data might be hidden     Patient questions:  Do you have a fever, pain , or abdominal swelling? No. Pain Score  0 *  Have you tolerated food without any problems? Yes.    Have you been able to return to your normal activities? Yes.    Do you have any questions about your discharge instructions: Diet   No. Medications  No. Follow up visit  No.  Do you have questions or concerns about your Care? No.  Actions: * If pain score is 4 or above: No action needed, pain <4.  1. Have you developed a fever since your procedure? no  2.   Have you had an respiratory symptoms (SOB or cough) since your procedure? no  3.   Have you tested positive for COVID 19 since your procedure no  4.   Have you had any family members/close contacts diagnosed with the COVID 19 since your procedure?  no   If yes to any of these questions please route to Joylene John, RN and Alphonsa Gin, Therapist, sports.

## 2019-06-09 NOTE — Telephone Encounter (Signed)
Pls cal pt, she has questions about medications, she wants to know why Dr. Silverio Decamp changed Linzess for colesevelam. She is concerned about drug interaction with other medications that she takes. She also wants to know why she was prescribed Vit C. She also read that one of the meds' side effects is constipation, she is trying to resolve her issues with constipation so she is not sure of why she was given something that would aggravate it.

## 2019-06-09 NOTE — Telephone Encounter (Signed)
Please advise, thank you.

## 2019-06-10 ENCOUNTER — Other Ambulatory Visit: Payer: Self-pay

## 2019-06-10 MED ORDER — HYDROCORTISONE (PERIANAL) 2.5 % EX CREA: 1.0000 "application " | TOPICAL_CREAM | Freq: Two times a day (BID) | CUTANEOUS | 1 refills | Status: DC

## 2019-06-10 NOTE — Telephone Encounter (Signed)
I have spoken with the patient. She is presently not having any bowel movement issue. She had her colonoscopy on 06/07/19. States she has not "really even had a bowel movement. I was told it would take a few days to return to normal." Agreed with her understanding of this expectation and reassured the patient. She is agreeable to allowing a natural return of her bowel movements, not to take any antidiarrheals or laxatives. The Vitamin C she received was not in a prescription bottle and unable to determine why it was given to her. It is listed on her medication list as "recorded." Per Dr Silverio Decamp her request for a refill on [facility] is approved.

## 2019-06-10 NOTE — Telephone Encounter (Signed)
I didn't prescribe Vit C. She was complaining of diarrhea when she came in for procedure. She can stop the Colestid if she doesn't have diarrhea.

## 2019-06-14 ENCOUNTER — Telehealth: Payer: Self-pay | Admitting: Neurology

## 2019-06-14 NOTE — Telephone Encounter (Signed)
The patient was last seen 12/04/16. States she was previously followed here for essential tremors. She has already received her first COVID-19 vaccination. She is scheduled for the second dose next week. She wanted to make sure there was no contraindication. I told her based on just the diagnosis of essential tremors, there would be no reason not to get the second dose. However, I told her since we have not seen her in several years that she may need to discuss any further concerns over the second dose with her PCP (who would be more familiar with her current health status). She verbalized understanding.

## 2019-06-14 NOTE — Telephone Encounter (Signed)
Pt called wanting to speak to RN about the COVID Vaccine and if it has effect on Neurological problems. Please advise.

## 2019-06-15 ENCOUNTER — Encounter: Payer: Self-pay | Admitting: Gastroenterology

## 2019-06-17 DIAGNOSIS — F4310 Post-traumatic stress disorder, unspecified: Secondary | ICD-10-CM | POA: Diagnosis not present

## 2019-06-18 DIAGNOSIS — Z23 Encounter for immunization: Secondary | ICD-10-CM | POA: Diagnosis not present

## 2019-07-07 ENCOUNTER — Encounter: Payer: Self-pay | Admitting: Gastroenterology

## 2019-07-07 ENCOUNTER — Ambulatory Visit (INDEPENDENT_AMBULATORY_CARE_PROVIDER_SITE_OTHER): Payer: Medicare Other | Admitting: Gastroenterology

## 2019-07-07 VITALS — BP 104/58 | HR 70 | Temp 98.3°F | Ht 70.0 in | Wt 150.0 lb

## 2019-07-07 DIAGNOSIS — Z86010 Personal history of colonic polyps: Secondary | ICD-10-CM | POA: Diagnosis not present

## 2019-07-07 DIAGNOSIS — K581 Irritable bowel syndrome with constipation: Secondary | ICD-10-CM | POA: Diagnosis not present

## 2019-07-07 DIAGNOSIS — M6289 Other specified disorders of muscle: Secondary | ICD-10-CM | POA: Diagnosis not present

## 2019-07-07 DIAGNOSIS — K449 Diaphragmatic hernia without obstruction or gangrene: Secondary | ICD-10-CM | POA: Diagnosis not present

## 2019-07-07 DIAGNOSIS — K641 Second degree hemorrhoids: Secondary | ICD-10-CM | POA: Diagnosis not present

## 2019-07-07 DIAGNOSIS — K219 Gastro-esophageal reflux disease without esophagitis: Secondary | ICD-10-CM

## 2019-07-07 DIAGNOSIS — Z860101 Personal history of adenomatous and serrated colon polyps: Secondary | ICD-10-CM

## 2019-07-07 NOTE — Patient Instructions (Signed)
If you are age 67 or older, your body mass index should be between 23-30. Your Body mass index is 21.52 kg/m. If this is out of the aforementioned range listed, please consider follow up with your Primary Care Provider.  If you are age 61 or younger, your body mass index should be between 19-25. Your Body mass index is 21.52 kg/m. If this is out of the aformentioned range listed, please consider follow up with your Primary Care Provider.   Continue Linzess 72 mcg    Increase the fiber in your diet and increase your fluid intake.  Look into purchasing a squatty potty.  Follow up 6-8 weeks. We have place a recall.  It was a pleasure to see you today!  Dr. Silverio Decamp

## 2019-07-07 NOTE — Progress Notes (Signed)
Angela Ray    [id number]    [birth year]  Primary Care Physician:Copland, Gay Filler, MD  Referring Physician: Darreld Mclean, [address] [place],  [place] [address]   Chief complaint:  Constipation  HPI: 69 year old female with history of chronic irritable bowel syndrome with alternating diarrhea and constipation here for follow-up visit complaints of persistent constipation.  Colonoscopy June 07, 2019: 3 sessile polyps [ tubular adenomas and 1 hyperplastic polyp] removed, random colon biopsies negative for microscopic colitis, and treatment hemorrhoids otherwise normal exam.  Recall colonoscopy in 3 years  She is no longer having diarrhea, she did not have bowel movement for almost 4 weeks after colonoscopy She is currently taking Linzess 72 mcg intermittently and she has small bowel movement whenever she takes, and she started taking it few days ago. She has tried Metamucil and also MiraLAX with no improvement, had significant bloating and flatulence with both. Denies any abdominal pain, nausea, vomiting, dark stool or rectal bleeding. She has to strain excessively to defecate and she feels her hemorrhoids are getting worse when she strains.    Outpatient Encounter Medications as of 07/07/2019  Medication Sig  . Azelaic Acid 15 % cream Apply 1 application topically every morning.  . busPIRone (BUSPAR) 30 MG tablet Takes 1/2 tablet bid  . cetirizine (ZYRTEC) 10 MG tablet Take 10 mg by mouth daily as needed for allergies.   . clonazePAM (KLONOPIN) 1 MG tablet Take 1 tablet by mouth 3 (three) times daily.  . Cyanocobalamin (B-12) 2500 MCG TABS Take by mouth daily.  . cyclobenzaprine (FLEXERIL) 10 MG tablet TAKE 1 TABLET BY MOUTH TWICE A DAY AS NEEDED FOR MUSCLE SPASMS  . ESTRACE VAGINAL 0.1 MG/GM vaginal cream Apply 1 application topically daily. Apply small amount externally to vaginal area daily  . fluticasone (CUTIVATE) 0.05 % cream  Apply 1 application topically 2 (two) times daily.  . fluticasone (FLONASE) 50 MCG/ACT nasal spray Place 2 sprays into both nostrils at bedtime as needed for allergies.   Marland Kitchen gabapentin (NEURONTIN) 300 MG capsule TAKE 1 CAPSULE (300 MG TOTAL) BY MOUTH AT BEDTIME.  . hydrocortisone (ANUSOL-HC) 2.5 % rectal cream Place 1 application rectally 2 (two) times daily.  Marland Kitchen ibuprofen (ADVIL,MOTRIN) 600 MG tablet TAKE 1 TABLET (600 MG TOTAL) BY MOUTH EVERY 8 (EIGHT) HOURS AS NEEDED (PAIN).  Marland Kitchen lidocaine (XYLOCAINE) 5 % ointment Apply daily as needed  . Lifitegrast (XIIDRA) 5 % SOLN Apply 1 drop to eye daily.  Marland Kitchen LINZESS 72 MCG capsule TAKE 1 CAPSULE (72 MCG TOTAL) BY MOUTH DAILY BEFORE BREAKFAST.  . Multiple Vitamins-Minerals (HAIR/SKIN/NAILS/BIOTIN PO) Take by mouth daily.  Marland Kitchen OVER THE COUNTER MEDICATION Take 1 capsule by mouth 3 (three) times daily. Hydro eye  . OVER THE COUNTER MEDICATION 50 mg at bedtime. Zyquil  . PARoxetine (PAXIL) 20 MG tablet Take 20 mg by mouth 2 (two) times daily.  . propranolol (INDERAL) 20 MG tablet Take 1 tablet (20 mg total) by mouth 2 (two) times daily.  . traMADol (ULTRAM) 50 MG tablet Take 1 tablet (50 mg total) by mouth every 8 (eight) hours as needed.  . tretinoin (RETIN-A) 0.025 % cream Apply 1 application topically at bedtime.  Marland Kitchen trimethoprim (TRIMPEX) 100 MG tablet Take 1 tablet by mouth daily.  . Vitamins C E (CRANBERRY URINARY COMFORT) 100-3 MG-UNIT CAPS Take by mouth.  . [DISCONTINUED] Apoaequorin (PREVAGEN) 10 MG CAPS Take 1 tablet  by mouth daily.  . [DISCONTINUED] bromfenac (XIBROM) 0.09 % ophthalmic solution Place 1 drop into both eyes 2 (two) times daily.  . [DISCONTINUED] cholestyramine (QUESTRAN) 4 g packet TAKE 1 PACKET (4 G TOTAL) BY MOUTH 2 (TWO) TIMES DAILY AS NEEDED. (Patient not taking: Reported on 06/07/2019)  . [DISCONTINUED] colesevelam (WELCHOL) 625 MG tablet Take 1 tablet (625 mg total) by mouth 2 (two) times daily with a meal.  . [DISCONTINUED]  ondansetron (ZOFRAN-ODT) 4 MG disintegrating tablet Take 4 mg by mouth 3 (three) times daily.   Facility-Administered Encounter Medications as of 07/07/2019  Medication  . 0.9 %  sodium chloride infusion    Allergies as of 07/07/2019 - Review Complete 07/07/2019  Allergen Reaction Noted  . Aciphex [rabeprazole sodium] Other (See Comments) 06/24/2011  . Bupropion hcl Other (See Comments) 05/19/2006  . Cephalexin Nausea Only and Other (See Comments) 05/19/2006  . Codeine Other (See Comments) 06/24/2011  . Esomeprazole magnesium Other (See Comments) 06/24/2011  . Sertraline hcl Other (See Comments) 05/19/2006  . Doxycycline hyclate Nausea Only 05/19/2006    Past Medical History:  Diagnosis Date  . Anxiety   . Barrett's esophagus    in the past  . Colon polyp   . Depression   . Dry eye syndrome of both eyes   . Dupuytren's contracture of both hands 10/2016   sees Dr.Gramig--[facility]  . Essential tremor 2016   legs, arm,lower jaw  . Female bladder prolapse   . Fibroid   . Fibromyalgia   . GERD (gastroesophageal reflux disease)   . Heat exhaustion   . Herniated disc, cervical    C3  . Hiatal hernia   . History of cardiovascular stress test    Lexiscan Myoview 7/16:  EF 65%, inferolateral defect (prob artifactual), no ischemia  . History of recurrent UTIs   . Hyperlipidemia   . IBS (irritable bowel syndrome)    constipation  . Leg cramps   . Low back pain   . Lumbar herniated disc    L4,L5  . Osteoarthritis   . Osteopenia   . Osteoporosis    Osteopenia per pt  . Rheumatoid arthritis, adult ([place]) 2016   both feet  . Skin cancer   . Spinal stenosis    lumbar  . Tremors of nervous system   . Vertigo     Past Surgical History:  Procedure Laterality Date  . ABDOMINAL HYSTERECTOMY  2003   TAH still has ovaries--[name]  . CERVICAL LAMINECTOMY  2002   C5/C6  . CHOLECYSTECTOMY  2008  . fractured ankle Left 10/2014  . ROTATOR CUFF REPAIR Right 2011     Family History  Problem Relation Age of Onset  . Coronary artery disease Father 43       CABG 2002, followd by Dr, Ron Parker  . Prostate cancer Father   . Bladder Cancer Father   . Skin cancer Father   . Diabetes Father 54       DM type 2  . Atrial fibrillation Mother   . Multiple myeloma Mother   . Skin cancer Mother   . Coronary artery disease Mother   . Irritable bowel syndrome Mother   . Cancer Mother        multiple myeloma  . Diabetes Maternal Grandmother   . Hypertension Maternal Grandmother   . Diabetes Maternal Grandfather   . Hypertension Maternal Grandfather   . Osteoarthritis Paternal Grandmother   . Cancer Maternal Aunt  bone cancer  . Colon cancer Neg Hx   . Esophageal cancer Neg Hx   . Stomach cancer Neg Hx   . Rectal cancer Neg Hx     Social History   Socioeconomic History  . Marital status: Married    Spouse name: Not on file  . Number of children: 0  . Years of education: 4  . Highest education level: Not on file  Occupational History  . Occupation: Retired    Fish farm manager: RETIRED  Tobacco Use  . Smoking status: Never Smoker  . Smokeless tobacco: Never Used  Substance and Sexual Activity  . Alcohol use: No    Alcohol/week: 0.0 standard drinks    Comment: occ glass of wine  . Drug use: No  . Sexual activity: Not Currently    Partners: Male    Birth control/protection: Surgical    Comment: TAH still has ovaries  Other Topics Concern  . Not on file  Social History Narrative   Lives at home with her husband.   Right-handed.   Rarely uses caffeine.   Social Determinants of Health   Financial Resource Strain:   . Difficulty of Paying Living Expenses: Not on file  Food Insecurity:   . Worried About Charity fundraiser in the Last Year: Not on file  . Ran Out of Food in the Last Year: Not on file  Transportation Needs:   . Lack of Transportation (Medical): Not on file  . Lack of Transportation (Non-Medical): Not on file  Physical  Activity:   . Days of Exercise per Week: Not on file  . Minutes of Exercise per Session: Not on file  Stress:   . Feeling of Stress : Not on file  Social Connections:   . Frequency of Communication with Friends and Family: Not on file  . Frequency of Social Gatherings with Friends and Family: Not on file  . Attends Religious Services: Not on file  . Active Member of Clubs or Organizations: Not on file  . Attends Archivist Meetings: Not on file  . Marital Status: Not on file  Intimate Partner Violence:   . Fear of Current or Ex-Partner: Not on file  . Emotionally Abused: Not on file  . Physically Abused: Not on file  . Sexually Abused: Not on file      Review of systems: All other review of systems negative except as mentioned in the HPI.   Physical Exam: Vitals:   07/07/19 1029  BP: (!) 104/58  Pulse: 70  Temp: 98.3 F (36.8 C)   Body mass index is 21.52 kg/m. Gen:      No acute distress Neuro: alert and oriented x 3 Psych: normal mood and affect  Data Reviewed:  Reviewed labs, radiology imaging, old records and pertinent past GI work up   Assessment and Plan/Recommendations:  69 year old female with history of chronic GERD, hiatal hernia, fibromyalgia, chronic anxiety disorder and chronic irritable bowel syndrome  Chronic irritable bowel syndrome predominant constipation: Continue Linzess 72 mcg daily, if persistent constipation despite taking it every day for next 1 to 2 weeks, will plan to increase the dose to 145 mcg daily  Increase dietary fiber and fluid intake Use squatty potty Avoid excessive straining  Pelvic floor dysfunction with possible dyssynergy defecation: If continues to have persistent difficulty evacuating and constipation with outlet dysfunction, consider anorectal manometry for further evaluation and refer to pelvic floor physical therapy for biofeedback  Symptomatic hemorrhoids: Avoid excessive straining Will hold off  internal  hemorrhoid band ligation until constipation resolved to prevent recurrence  GERD and hiatal hernia: Continue antireflux measures  History of multiple adenomatous colon polyps, due for recall colonoscopy January 2024  Return in 6 to 8 weeks or sooner if needed  This visit required 40 minutes of patient care (this includes precharting, chart review, review of results, face-to-face time used for counseling as well as treatment plan and follow-up. The patient was provided an opportunity to ask questions and all were answered. The patient agreed with the plan and demonstrated an understanding of the instructions.  Damaris Hippo , MD    CC: Copland, Gay Filler, MD

## 2019-07-13 DIAGNOSIS — F4310 Post-traumatic stress disorder, unspecified: Secondary | ICD-10-CM | POA: Diagnosis not present

## 2019-07-27 DIAGNOSIS — F4310 Post-traumatic stress disorder, unspecified: Secondary | ICD-10-CM | POA: Diagnosis not present

## 2019-08-08 MED FILL — BUSPIRONE HCL 30 MG TABS: 30 | 90 days supply | Qty: 90 | Fill #3

## 2019-08-10 DIAGNOSIS — F4310 Post-traumatic stress disorder, unspecified: Secondary | ICD-10-CM | POA: Diagnosis not present

## 2019-08-19 DIAGNOSIS — L821 Other seborrheic keratosis: Secondary | ICD-10-CM | POA: Diagnosis not present

## 2019-08-19 DIAGNOSIS — L814 Other melanin hyperpigmentation: Secondary | ICD-10-CM | POA: Diagnosis not present

## 2019-08-19 DIAGNOSIS — D225 Melanocytic nevi of trunk: Secondary | ICD-10-CM | POA: Diagnosis not present

## 2019-08-19 DIAGNOSIS — D1801 Hemangioma of skin and subcutaneous tissue: Secondary | ICD-10-CM | POA: Diagnosis not present

## 2019-08-30 ENCOUNTER — Other Ambulatory Visit: Payer: Self-pay | Admitting: Gastroenterology

## 2019-09-07 DIAGNOSIS — F4310 Post-traumatic stress disorder, unspecified: Secondary | ICD-10-CM | POA: Diagnosis not present

## 2019-09-15 DIAGNOSIS — H16223 Keratoconjunctivitis sicca, not specified as Sjogren's, bilateral: Secondary | ICD-10-CM | POA: Diagnosis not present

## 2019-09-15 DIAGNOSIS — G43809 Other migraine, not intractable, without status migrainosus: Secondary | ICD-10-CM | POA: Diagnosis not present

## 2019-09-15 DIAGNOSIS — H43811 Vitreous degeneration, right eye: Secondary | ICD-10-CM | POA: Diagnosis not present

## 2019-09-15 DIAGNOSIS — H2513 Age-related nuclear cataract, bilateral: Secondary | ICD-10-CM | POA: Diagnosis not present

## 2019-09-21 DIAGNOSIS — F4310 Post-traumatic stress disorder, unspecified: Secondary | ICD-10-CM | POA: Diagnosis not present

## 2019-09-23 ENCOUNTER — Other Ambulatory Visit: Payer: Self-pay | Admitting: Family Medicine

## 2019-09-23 DIAGNOSIS — G250 Essential tremor: Secondary | ICD-10-CM

## 2019-09-30 ENCOUNTER — Encounter: Payer: Self-pay | Admitting: Gastroenterology

## 2019-10-07 NOTE — Telephone Encounter (Signed)
09/23/19 mychart message came back as unread. Attempted to reach pt by phone and left message to return my call. Needs CPE with Dr Lorelei Pont before further medication refills are needed. Has been greater than 1 year since she has seen her PCP.  Also mailed letter to call and schedule appointment.

## 2019-10-18 ENCOUNTER — Other Ambulatory Visit (HOSPITAL_COMMUNITY): Payer: Self-pay | Admitting: Specialist

## 2019-10-19 DIAGNOSIS — F4310 Post-traumatic stress disorder, unspecified: Secondary | ICD-10-CM | POA: Diagnosis not present

## 2019-11-02 DIAGNOSIS — F4310 Post-traumatic stress disorder, unspecified: Secondary | ICD-10-CM | POA: Diagnosis not present

## 2019-11-04 NOTE — Patient Instructions (Addendum)
It was great to see you again today, I will be in touch with your labs as soon as possible Please consider getting the new and improved shingles vaccine, Shingrix, at your pharmacy I gave you atrovent nasal to use as needed for runny nose   Health Maintenance After Age 67 After age 1, you are at a higher risk for certain long-term diseases and infections as well as injuries from falls. Falls are a major cause of broken bones and head injuries in people who are older than age 72. Getting regular preventive care can help to keep you healthy and well. Preventive care includes getting regular testing and making lifestyle changes as recommended by your health care provider. Talk with your health care provider about:  Which screenings and tests you should have. A screening is a test that checks for a disease when you have no symptoms.  A diet and exercise plan that is right for you. What should I know about screenings and tests to prevent falls? Screening and testing are the best ways to find a health problem early. Early diagnosis and treatment give you the best chance of managing medical conditions that are common after age 52. Certain conditions and lifestyle choices may make you more likely to have a fall. Your health care provider may recommend:  Regular vision checks. Poor vision and conditions such as cataracts can make you more likely to have a fall. If you wear glasses, make sure to get your prescription updated if your vision changes.  Medicine review. Work with your health care provider to regularly review all of the medicines you are taking, including over-the-counter medicines. Ask your health care provider about any side effects that may make you more likely to have a fall. Tell your health care provider if any medicines that you take make you feel dizzy or sleepy.  Osteoporosis screening. Osteoporosis is a condition that causes the bones to get weaker. This can make the bones weak and  cause them to break more easily.  Blood pressure screening. Blood pressure changes and medicines to control blood pressure can make you feel dizzy.  Strength and balance checks. Your health care provider may recommend certain tests to check your strength and balance while standing, walking, or changing positions.  Foot health exam. Foot pain and numbness, as well as not wearing proper footwear, can make you more likely to have a fall.  Depression screening. You may be more likely to have a fall if you have a fear of falling, feel emotionally low, or feel unable to do activities that you used to do.  Alcohol use screening. Using too much alcohol can affect your balance and may make you more likely to have a fall. What actions can I take to lower my risk of falls? General instructions  Talk with your health care provider about your risks for falling. Tell your health care provider if: ? You fall. Be sure to tell your health care provider about all falls, even ones that seem minor. ? You feel dizzy, sleepy, or off-balance.  Take over-the-counter and prescription medicines only as told by your health care provider. These include any supplements.  Eat a healthy diet and maintain a healthy weight. A healthy diet includes low-fat dairy products, low-fat (lean) meats, and fiber from whole grains, beans, and lots of fruits and vegetables. Home safety  Remove any tripping hazards, such as rugs, cords, and clutter.  Install safety equipment such as grab bars in bathrooms and safety  rails on stairs.  Keep rooms and walkways well-lit. Activity   Follow a regular exercise program to stay fit. This will help you maintain your balance. Ask your health care provider what types of exercise are appropriate for you.  If you need a cane or walker, use it as recommended by your health care provider.  Wear supportive shoes that have nonskid soles. Lifestyle  Do not drink alcohol if your health care  provider tells you not to drink.  If you drink alcohol, limit how much you have: ? 0-1 drink a day for women. ? 0-2 drinks a day for men.  Be aware of how much alcohol is in your drink. In the [place] one drink equals one typical bottle of beer (12 oz), one-half glass of wine (5 oz), or one shot of hard liquor (1 oz).  Do not use any products that contain nicotine or tobacco, such as cigarettes and e-cigarettes. If you need help quitting, ask your health care provider. Summary  Having a healthy lifestyle and getting preventive care can help to protect your health and wellness after age 45.  Screening and testing are the best way to find a health problem early and help you avoid having a fall. Early diagnosis and treatment give you the best chance for managing medical conditions that are more common for people who are older than age 36.  Falls are a major cause of broken bones and head injuries in people who are older than age 62. Take precautions to prevent a fall at home.  Work with your health care provider to learn what changes you can make to improve your health and wellness and to prevent falls. This information is not intended to replace advice given to you by your health care provider. Make sure you discuss any questions you have with your health care provider. Document Revised: 08/19/2018 Document Reviewed: 03/11/2017 Elsevier Patient Education  2020 [employer].
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
## 2019-11-07 ENCOUNTER — Ambulatory Visit (INDEPENDENT_AMBULATORY_CARE_PROVIDER_SITE_OTHER): Payer: Medicare Other | Admitting: Family Medicine

## 2019-11-07 ENCOUNTER — Other Ambulatory Visit: Payer: Self-pay

## 2019-11-07 ENCOUNTER — Encounter: Payer: Self-pay | Admitting: Family Medicine

## 2019-11-07 VITALS — BP 110/62 | HR 85 | Resp 16 | Ht 70.0 in | Wt 147.0 lb

## 2019-11-07 DIAGNOSIS — M48061 Spinal stenosis, lumbar region without neurogenic claudication: Secondary | ICD-10-CM | POA: Diagnosis not present

## 2019-11-07 DIAGNOSIS — J3489 Other specified disorders of nose and nasal sinuses: Secondary | ICD-10-CM

## 2019-11-07 DIAGNOSIS — D049 Carcinoma in situ of skin, unspecified: Secondary | ICD-10-CM | POA: Diagnosis not present

## 2019-11-07 DIAGNOSIS — Z13220 Encounter for screening for lipoid disorders: Secondary | ICD-10-CM | POA: Diagnosis not present

## 2019-11-07 DIAGNOSIS — G250 Essential tremor: Secondary | ICD-10-CM

## 2019-11-07 DIAGNOSIS — R634 Abnormal weight loss: Secondary | ICD-10-CM

## 2019-11-07 DIAGNOSIS — R7309 Other abnormal glucose: Secondary | ICD-10-CM | POA: Diagnosis not present

## 2019-11-07 DIAGNOSIS — Z130 Encounter for screening for diseases of the blood and blood-forming organs and certain disorders involving the immune mechanism: Secondary | ICD-10-CM | POA: Diagnosis not present

## 2019-11-07 LAB — COMPREHENSIVE METABOLIC PANEL
ALT: 16 U/L (ref 0–35)
AST: 21 U/L (ref 0–37)
Albumin: 4.6 g/dL (ref 3.5–5.2)
Alkaline Phosphatase: 33 U/L — ABNORMAL LOW (ref 39–117)
BUN: 12 mg/dL (ref 6–23)
CO2: 31 mEq/L (ref 19–32)
Calcium: 9.4 mg/dL (ref 8.4–10.5)
Chloride: 101 mEq/L (ref 96–112)
Creatinine, Ser: 0.78 mg/dL (ref 0.40–1.20)
GFR: 73.69 mL/min (ref >60.00)
Glucose, Bld: 98 mg/dL (ref 70–99)
Potassium: 4.5 mEq/L (ref 3.5–5.1)
Sodium: 139 mEq/L (ref 135–145)
Total Bilirubin: 0.7 mg/dL (ref 0.2–1.2)
Total Protein: 6.7 g/dL (ref 6.0–8.3)

## 2019-11-07 LAB — TSH: TSH: 2.89 u[IU]/mL (ref 0.35–4.50)

## 2019-11-07 LAB — LIPID PANEL
Cholesterol: 244 mg/dL — ABNORMAL HIGH (ref 0–200)
HDL: 48 mg/dL (ref >39.00)
LDL Cholesterol: 173 mg/dL — ABNORMAL HIGH (ref 0–99)
NonHDL: 195.93
Total CHOL/HDL Ratio: 5
Triglycerides: 117 mg/dL (ref 0.0–149.0)
VLDL: 23.4 mg/dL (ref 0.0–40.0)

## 2019-11-07 LAB — CBC
HCT: 40.2 % (ref 36.0–46.0)
Hemoglobin: 13.6 g/dL (ref 12.0–15.0)
MCHC: 33.8 g/dL (ref 30.0–36.0)
MCV: 95.4 fl (ref 78.0–100.0)
Platelets: 204 10*3/uL (ref 150.0–400.0)
RBC: 4.22 Mil/uL (ref 3.87–5.11)
RDW: 13.2 % (ref 11.5–15.5)
WBC: 3.9 10*3/uL — ABNORMAL LOW (ref 4.0–10.5)

## 2019-11-07 LAB — HEMOGLOBIN A1C: Hgb A1c MFr Bld: 5.2 % (ref 4.6–6.5)

## 2019-11-07 MED ORDER — IPRATROPIUM BROMIDE 0.03 % NA SOLN: 2.0000 | Freq: Two times a day (BID) | NASAL | 12 refills | Status: DC

## 2019-11-07 MED FILL — BUSPIRONE HCL 30 MG TABS: 30 | 90 days supply | Qty: 90 | Fill #4

## 2019-11-16 DIAGNOSIS — F4310 Post-traumatic stress disorder, unspecified: Secondary | ICD-10-CM | POA: Diagnosis not present

## 2019-11-30 DIAGNOSIS — F4310 Post-traumatic stress disorder, unspecified: Secondary | ICD-10-CM | POA: Diagnosis not present

## 2019-12-12 DIAGNOSIS — Z1231 Encounter for screening mammogram for malignant neoplasm of breast: Secondary | ICD-10-CM | POA: Diagnosis not present

## 2019-12-14 ENCOUNTER — Encounter: Payer: Self-pay | Admitting: Gastroenterology

## 2019-12-14 ENCOUNTER — Ambulatory Visit (INDEPENDENT_AMBULATORY_CARE_PROVIDER_SITE_OTHER): Payer: Medicare Other | Admitting: Gastroenterology

## 2019-12-14 VITALS — BP 90/52 | HR 68 | Ht 70.25 in | Wt 148.8 lb

## 2019-12-14 DIAGNOSIS — K5902 Outlet dysfunction constipation: Secondary | ICD-10-CM | POA: Diagnosis not present

## 2019-12-14 DIAGNOSIS — K581 Irritable bowel syndrome with constipation: Secondary | ICD-10-CM | POA: Diagnosis not present

## 2019-12-14 NOTE — Progress Notes (Signed)
Angela Ray    [id number]    [birth year]  Primary Care Physician:Copland, Gay Filler, MD  Referring Physician: Darreld Mclean, [address] [place],  [place] [address]   Chief complaint: IBS with alternating constipation and diarrhea  HPI:  69 year old female with history of chronic irritable bowel syndrome with alternating diarrhea and constipation here for follow-up visit complaints of persistent constipation.  Constipation significantly worse after colonoscopy.  She is currently having a bowel movement once in every 7 to 10 days.  She had severe diarrhea with Linzess.  She tried over-the-counter laxatives, fiber supplements and probiotics with no improvement.  She is unable to evacuate unless she strains excessively, feels tears in the rectum and also has small-volume bright red blood per rectum intermittently.    Colonoscopy June 07, 2019: 3 sessile polyps [ tubular adenomas and 1 hyperplastic polyp] removed, random colon biopsies negative for microscopic colitis, and treatment hemorrhoids otherwise normal exam.  Recall colonoscopy in 3 years  Outpatient Encounter Medications as of 12/14/2019  Medication Sig   Azelaic Acid 15 % cream Apply 1 application topically every morning.   busPIRone (BUSPAR) 30 MG tablet Takes 1/2 tablet bid   cetirizine (ZYRTEC) 10 MG tablet Take 10 mg by mouth daily as needed for allergies.    Cholecalciferol (VITAMIN D3) 50 MCG (2000 UT) CHEW Chew by mouth.   clonazePAM (KLONOPIN) 1 MG tablet Take 1 tablet by mouth 3 (three) times daily.   Cyanocobalamin (B-12) 2500 MCG TABS Take by mouth daily.   cyclobenzaprine (FLEXERIL) 10 MG tablet TAKE 1 TABLET BY MOUTH TWICE A DAY AS NEEDED FOR MUSCLE SPASMS   ESTRACE VAGINAL 0.1 MG/GM vaginal cream Apply 1 application topically daily. Apply small amount externally to vaginal area daily   fluticasone (CUTIVATE) 0.05 % cream Apply 1 application topically 2 (two)  times daily.   fluticasone (FLONASE) 50 MCG/ACT nasal spray Place 2 sprays into both nostrils at bedtime as needed for allergies.    gabapentin (NEURONTIN) 300 MG capsule TAKE 1 CAPSULE (300 MG TOTAL) BY MOUTH AT BEDTIME.   hydrocortisone (ANUSOL-HC) 2.5 % rectal cream Place 1 application rectally 2 (two) times daily.   ibuprofen (ADVIL,MOTRIN) 600 MG tablet TAKE 1 TABLET (600 MG TOTAL) BY MOUTH EVERY 8 (EIGHT) HOURS AS NEEDED (PAIN).   ipratropium (ATROVENT) 0.03 % nasal spray Place 2 sprays into both nostrils every 12 (twelve) hours.   lidocaine (XYLOCAINE) 5 % ointment Apply daily as needed   Lifitegrast (XIIDRA) 5 % SOLN Apply 1 drop to eye daily.   Multiple Vitamins-Minerals (HAIR/SKIN/NAILS/BIOTIN PO) Take by mouth daily.   OVER THE COUNTER MEDICATION Take 1 capsule by mouth 3 (three) times daily. Hydro eye   OVER THE COUNTER MEDICATION 50 mg at bedtime. Zyquil   PARoxetine (PAXIL) 20 MG tablet Take 20 mg by mouth 2 (two) times daily.   propranolol (INDERAL) 20 MG tablet TAKE 1 TABLET BY MOUTH TWICE A DAY   tretinoin (RETIN-A) 0.025 % cream Apply 1 application topically at bedtime.   trimethoprim (TRIMPEX) 100 MG tablet Take 1 tablet by mouth daily.   Facility-Administered Encounter Medications as of 12/14/2019  Medication   0.9 %  sodium chloride infusion    Allergies as of 12/14/2019 - Review Complete 12/14/2019  Allergen Reaction Noted   Aciphex [rabeprazole sodium] Other (See Comments) 06/24/2011   Bupropion hcl Other (See Comments) 05/19/2006   Cephalexin Nausea Only and  Other (See Comments) 05/19/2006   Codeine Other (See Comments) 06/24/2011   Esomeprazole magnesium Other (See Comments) 06/24/2011   Sertraline hcl Other (See Comments) 05/19/2006   Linzess [linaclotide] Diarrhea 11/07/2019   Doxycycline hyclate Nausea Only 05/19/2006    Past Medical History:  Diagnosis Date   Anxiety    Barrett's esophagus    in the past   Colon polyp     Depression    Dry eye syndrome of both eyes    Dupuytren's contracture of both hands 10/2016   sees Dr.Gramig--[facility]   Essential tremor 2016   legs, arm,lower jaw   Female bladder prolapse    Fibroid    Fibromyalgia    GERD (gastroesophageal reflux disease)    Heat exhaustion    Herniated disc, cervical    C3   Hiatal hernia    History of cardiovascular stress test    Lexiscan Myoview 7/16:  EF 65%, inferolateral defect (prob artifactual), no ischemia   History of recurrent UTIs    Hyperlipidemia    IBS (irritable bowel syndrome)    constipation   Leg cramps    Low back pain    Lumbar herniated disc    L4,L5   Osteoarthritis    Osteopenia    Osteoporosis    Osteopenia per pt   Rheumatoid arthritis, adult ([place]) 2016   both feet   Skin cancer    Spinal stenosis    lumbar   Tremors of nervous system    Vertigo     Past Surgical History:  Procedure Laterality Date   ABDOMINAL HYSTERECTOMY  2003   TAH still has ovaries--[name]   CERVICAL LAMINECTOMY  2002   C5/C6   CHOLECYSTECTOMY  2008   fractured ankle Left 10/2014   ROTATOR CUFF REPAIR Right 2011    Family History  Problem Relation Age of Onset   Coronary artery disease Father 75       CABG 2002, followd by Dr, Ron Parker   Prostate cancer Father    Bladder Cancer Father    Skin cancer Father    Diabetes Father 28       DM type 2   Atrial fibrillation Mother    Multiple myeloma Mother    Skin cancer Mother    Coronary artery disease Mother    Irritable bowel syndrome Mother    Cancer Mother        multiple myeloma   Diabetes Maternal Grandmother    Hypertension Maternal Grandmother    Diabetes Maternal Grandfather    Hypertension Maternal Grandfather    Osteoarthritis Paternal Grandmother    Cancer Maternal Aunt        bone cancer   Colon cancer Neg Hx    Esophageal cancer Neg Hx    Stomach cancer Neg Hx    Rectal cancer Neg Hx      Social History   Socioeconomic History   Marital status: Married    Spouse name: Not on file   Number of children: 0   Years of education: 12   Highest education level: Not on file  Occupational History   Occupation: Retired    Fish farm manager: RETIRED  Tobacco Use   Smoking status: Never Smoker   Smokeless tobacco: Never Used  Scientific laboratory technician Use: Never used  Substance and Sexual Activity   Alcohol use: No    Alcohol/week: 0.0 standard drinks    Comment: occ glass of wine   Drug use: No  Sexual activity: Not Currently    Partners: Male    Birth control/protection: Surgical    Comment: TAH still has ovaries  Other Topics Concern   Not on file  Social History Narrative   Lives at home with her husband.   Right-handed.   Rarely uses caffeine.   Social Determinants of Health   Financial Resource Strain:    Difficulty of Paying Living Expenses:   Food Insecurity:    Worried About Charity fundraiser in the Last Year:    Arboriculturist in the Last Year:   Transportation Needs:    Film/video editor (Medical):    Lack of Transportation (Non-Medical):   Physical Activity:    Days of Exercise per Week:    Minutes of Exercise per Session:   Stress:    Feeling of Stress :   Social Connections:    Frequency of Communication with Friends and Family:    Frequency of Social Gatherings with Friends and Family:    Attends Religious Services:    Active Member of Clubs or Organizations:    Attends Music therapist:    Marital Status:   Intimate Partner Violence:    Fear of Current or Ex-Partner:    Emotionally Abused:    Physically Abused:    Sexually Abused:       Review of systems: All other review of systems negative except as mentioned in the HPI.   Physical Exam: Vitals:   12/14/19 0933  BP: (!) 90/52  Pulse: 68   Body mass index is 21.2 kg/m. Gen:      No acute distress HEENT:   sclera anicteric Abd:       soft, non-tender; no palpable masses, no distension Ext:    No edema Neuro: alert and oriented x 3 Psych: normal mood and affect Rectal exam: Prolapsed internal hemorrhoids, external hemorrhoids, weak anal sphincter resting and squeeze pressure.  No anal fissure.   Data Reviewed:  Reviewed labs, radiology imaging, old records and pertinent past GI work up   Assessment and Plan/Recommendations:  69 year old very pleasant female with chronic history of irritable bowel syndrome with alternating constipation and diarrhea  Pelvic floor dysfunction with dyssynergic defecation Schedule for anorectal manometry to evaluate degree of pelvic floor dysfunction Refer to pelvic floor physical therapy for biofeedback  IBS predominant constipation: Start Linzess 145 mcg, advised patient to take it every other day Continue with high-fiber diet and 8 to 10 cups of water per day  Return in 3 months or sooner if needed  This visit required >30 minutes of patient care (this includes precharting, chart review, review of results, face-to-face time used for counseling as well as treatment plan and follow-up. The patient was provided an opportunity to ask questions and all were answered. The patient agreed with the plan and demonstrated an understanding of the instructions.  Damaris Hippo , MD    CC: Copland, Gay Filler, MD

## 2019-12-14 NOTE — Patient Instructions (Addendum)
Take Linzess 145 mcg every other day  We have referred you to Pelvic Floor Therapy, they will contact you with that appointment  Due to recent COVID-19 restrictions implemented by our local and state authorities and in an effort to keep both patients and staff as safe as possible, our hospital system now requires COVID-19 testing prior to any scheduled hospital procedure. Please go to [place], [place],  [id number] on 12/19/2019 at  10:25am. This is a drive up testing site, you will not need to exit your vehicle.  You will not be billed at the time of testing but may receive a bill later depending on your insurance. The approximate cost of the test is $100. You must agree to quarantine from the time of your testing until the procedure date on 12/23/2019 . This should include staying at home with ONLY the people you live with. Avoid take-out, grocery store shopping or leaving the house for any non-emergent reason. Failure to have your COVID-19 test done on the date and time you have been scheduled will result in cancellation of procedure. Please call our office at [phone] if you have any questions.     You have been scheduled to have an anorectal manometry at [facility] on 8/13/2021at 12:30pm. Please arrive 30 minutes prior to your appointment time for registration (1st floor of the hospital-admissions).  Please make certain to use 1 Fleets enema 2 hours prior to coming for your appointment. You can purchase Fleets enemas from the laxative section at your drug store. You should not eat anything during the two hours prior to the procedure. You may take regular medications with small sips of water at least 2 hours prior to the study.  Anorectal manometry is a test performed to evaluate patients with constipation or fecal incontinence. This test measures the pressures of the anal sphincter muscles, the sensation in the rectum, and the neural reflexes that are needed for normal bowel  movements.  THE PROCEDURE The test takes approximately 30 minutes to 1 hour. You will be asked to change into a hospital gown. A technician or nurse will explain the procedure to you, take a brief health history, and answer any questions you may have. The patient then lies on his or her left side. A small, flexible tube, about the size of a thermometer, with a balloon at the end is inserted into the rectum. The catheter is connected to a machine that measures the pressure. During the test, the small balloon attached to the catheter may be inflated in the rectum to assess the normal reflex pathways. The nurse or technician may also ask the person to squeeze, relax, and push at various times. The anal sphincter muscle pressures are measured during each of these maneuvers. To squeeze, the patient tightens the sphincter muscles as if trying to prevent anything from coming out. To push or bear down, the patient strains down as if trying to have a bowel movement.  I appreciate the  opportunity to care for you  Thank You   Harl Bowie , MD

## 2019-12-15 ENCOUNTER — Encounter: Payer: Self-pay | Admitting: Obstetrics and Gynecology

## 2019-12-15 ENCOUNTER — Encounter: Payer: Self-pay | Admitting: Gastroenterology

## 2019-12-15 DIAGNOSIS — H40012 Open angle with borderline findings, low risk, left eye: Secondary | ICD-10-CM | POA: Diagnosis not present

## 2019-12-15 DIAGNOSIS — H2513 Age-related nuclear cataract, bilateral: Secondary | ICD-10-CM | POA: Diagnosis not present

## 2019-12-15 DIAGNOSIS — G43809 Other migraine, not intractable, without status migrainosus: Secondary | ICD-10-CM | POA: Diagnosis not present

## 2019-12-15 DIAGNOSIS — H16223 Keratoconjunctivitis sicca, not specified as Sjogren's, bilateral: Secondary | ICD-10-CM | POA: Diagnosis not present

## 2019-12-15 DIAGNOSIS — H43811 Vitreous degeneration, right eye: Secondary | ICD-10-CM | POA: Diagnosis not present

## 2019-12-16 ENCOUNTER — Other Ambulatory Visit: Payer: Self-pay

## 2019-12-16 ENCOUNTER — Encounter: Payer: Self-pay | Admitting: Physical Therapy

## 2019-12-16 ENCOUNTER — Ambulatory Visit: Payer: Medicare Other | Attending: Gastroenterology | Admitting: Physical Therapy

## 2019-12-16 DIAGNOSIS — R278 Other lack of coordination: Secondary | ICD-10-CM | POA: Insufficient documentation

## 2019-12-16 DIAGNOSIS — M6281 Muscle weakness (generalized): Secondary | ICD-10-CM | POA: Insufficient documentation
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
## 2019-12-16 NOTE — Patient Instructions (Signed)
Toileting Techniques for Bowel Movements    An Evacuation/Defecation Plan   Here are the 4 basic points:  1. Lean forward enough for your elbows to rest on your knees 2. Support your feet on the floor or use a low stool if your feet don't touch the floor  3. Push out your belly as if you have swallowed a beach ball--you should feel a widening of your waist. "Belly Big, Belly Hard" 4. Open and relax your pelvic floor muscles, rather than tightening around the anus  While you are sitting on the toilet pay attention to the following areas: . Jaw and mouth position- relaxed not clenched . Angle of your hips - leaning slightly forward . Whether your feet touch the ground or not - should be flat and supported . Arm placement - rest against your thighs . Spine position - flat back . Waist . Breathing - exhale as you push (like blowing up a balloon or try using other sounds such as ahhhh, shhhhh, ohhhh or grrrrrrr) . Belly - hard and tight as you push . Anus (opening of the anal canal) - relaxed and open as you push . Anus - Tighten and lift pulling the muscle back in after you are done or if taking a break  If you are not successful after 10-15 minutes, try again later.  Avoid negative self-talk about your toileting experience.   Read this for more details and ask your PT if you need suggestions for adjustments or limitations:  1) Sitting on the toilet  a) Make sure your feet are supported - flat on the floor or step stool b) Many people find it effective to lean forward or raise their knees.  Propping your feet on a step stool (Squatty Potty is a brand name) can help the muscles around the anus to relax  c) When you lean forward, place your forearms on your thighs for support  2) Relaxing a) Breathe deeply and slowly in through your nose and out through your mouth. b) To become aware of how to relax your muscles, contracting and releasing muscles can be helpful.  Pull your pelvic floor  muscles in tightly by using the image of holding back gas, or closing around the anus (visualize making a circle smaller) and lifting the anus up and in.  Then release the muscles and your anus should drop down and feel open. Repeat 5 times ending with the feeling of relaxation. c) Keep your pelvic floor muscles relaxed; let your belly bulge out. d) The digestive tract starts at the mouth and ends at the anal opening, so be sure to relax both ends of the tube.  Place your tongue on the roof of your mouth with your teeth separated.  This helps relax your mouth and will help to relax the anus at the same time.  3) Emptying (defecation) a) Keep your pelvic floor and sphincter relaxed, then bulge your anal muscles.  Make the anal opening wide.  b) Stick your belly out as if you have swallowed a beach ball. c) Make your belly wall hard using your belly muscles while continuing to breathe. Doing this makes it easier to open your anus. d) Breath out and give a grunt (or try using other sounds such as ahhhh, shhhhh, ohhhh or grrrrrrr). e)  Can also try to act as if you are blowing up a balloon as you push  4) Finishing a) As you finish your bowel movement, pull the pelvic floor muscles  up and in.  This will leave your anus in the proper place rather than remaining pushed out and down. If you leave your anus pushed out and down, it will start to feel as though that is normal and give you incorrect signals about needing to have a bowel movement.   About Rectocele Overview A rectocele is a type of hernia which causes different degrees of bulging of the rectal tissues into the vaginal wall. You may even notice that it presses against the vaginal wall so much that some vaginal tissues droop outside of the opening of your vagina.  Causes of Rectocele The most common cause is childbirth. The muscles and ligaments in the pelvis that hold up and support the female organs and vagina become stretched and weakened  during labor and delivery. The more babies you have, the more the support tissues are stretched and weakened. Not everyone who has a baby will develop a rectocele. Some women have stronger supporting tissue in the pelvis and may not have as much of a problem as others. Women who have a Cesarean section usually do not get rectoceles unless they pushed a long time prior to the cesarean delivery.  Other conditions that can cause a rectocele include chronic constipation, a chronic cough, a lot of heavy lifting, and obesity. Older women may have this problem because the loss of female hormones causes the vaginal tissue to become weaker.  Symptoms There may not be any symptoms. If you do have symptoms, they may include:  . pelvic pressure in the rectal area  . protrusion of the lower part of the vagina through the opening of the vagina  . constipation and trapping of the stool, making it difficult to have a bowel movement. In severe cases, you may have to press on the lower part of your vagina to help push the stool out of your rectum.  This is called splinting to empty. Diagnosing Rectocele Your health care provider will ask about your symptoms and perform a pelvic exam. S/he will ask you to bear down, pushing like you are having a bowel movement so as to see how far the lower part of the vagina protrudes into the vagina and possibly outside of the vagina. Your provider will also ask you to contract the muscles of your pelvis (like you are stopping the stream in the middle of urinating) to determine the strength of your pelvic muscles. Your provider may also do a rectal exam.    Treatment Options If you do not have any symptoms, no treatment may be necessary. Other treatment options include:  . Pelvic floor exercises: Contracting the muscles in your genital area may help strengthen your muscles and support the organs.  Be sure to get proper exercise instruction from your physical therapist. . A pessary  (removable pelvic support device) sometimes helps rectocele symptoms. . Surgery: Surgical repair may be necessary. In some cases the uterus may need to be taken out (a hysterectomy) as well.  There are many types of surgery for pelvic support problems.  Look for physicians who specialize in repair procedures. You can take care of yourself by:  . treating and preventing constipation  . avoiding heavy lifting, and lifting correctly (with your legs, not with your waist or back)  . treating a chronic cough or bronchitis  . not smoking  . avoiding too much weight gain  . doing pelvic floor exercises    Abdominal massage 3-5 min a day (circles or vibration moving  around abdomen and finishing each circle coming down/inwards of Lt hip) Diaphragmatic breathing 3-5 min a day

## 2019-12-18 ENCOUNTER — Other Ambulatory Visit: Payer: Self-pay | Admitting: Family Medicine

## 2019-12-18 DIAGNOSIS — G250 Essential tremor: Secondary | ICD-10-CM

## 2019-12-19 ENCOUNTER — Other Ambulatory Visit (HOSPITAL_COMMUNITY)
Admission: RE | Admit: 2019-12-19 | Discharge: 2019-12-19 | Disposition: A | Discharge status: Home / Self Care | Payer: Medicare Other | Source: Ambulatory Visit | Attending: Gastroenterology | Admitting: Gastroenterology

## 2019-12-19 DIAGNOSIS — Z20822 Contact with and (suspected) exposure to covid-19: Secondary | ICD-10-CM | POA: Insufficient documentation

## 2019-12-19 DIAGNOSIS — Z01812 Encounter for preprocedural laboratory examination: Secondary | ICD-10-CM | POA: Diagnosis not present

## 2019-12-19 LAB — SARS CORONAVIRUS 2 (TAT 6-24 HRS): SARS Coronavirus 2: NEGATIVE

## 2019-12-23 ENCOUNTER — Ambulatory Visit (HOSPITAL_COMMUNITY)
Admission: RE | Admit: 2019-12-23 | Discharge: 2019-12-23 | Disposition: A | Discharge status: Home / Self Care | Payer: Medicare Other | Attending: Gastroenterology | Admitting: Gastroenterology

## 2019-12-23 ENCOUNTER — Encounter (HOSPITAL_COMMUNITY)
Admission: RE | Disposition: A | Discharge status: Home / Self Care | Payer: Self-pay | Source: Home / Self Care | Attending: Gastroenterology

## 2019-12-23 DIAGNOSIS — R195 Other fecal abnormalities: Secondary | ICD-10-CM | POA: Diagnosis not present

## 2019-12-23 DIAGNOSIS — K581 Irritable bowel syndrome with constipation: Secondary | ICD-10-CM | POA: Insufficient documentation

## 2019-12-23 DIAGNOSIS — R159 Full incontinence of feces: Secondary | ICD-10-CM

## 2019-12-23 DIAGNOSIS — K5902 Outlet dysfunction constipation: Secondary | ICD-10-CM | POA: Diagnosis not present

## 2019-12-23 HISTORY — PX: ANAL RECTAL MANOMETRY: SHX6358

## 2019-12-23 SURGERY — MANOMETRY, ANORECTAL

## 2019-12-23 NOTE — Progress Notes (Signed)
Anal manometry done per protocol. Pt tolerated well without distress or complication   

## 2019-12-24 ENCOUNTER — Encounter (HOSPITAL_COMMUNITY): Payer: Self-pay | Admitting: Gastroenterology

## 2019-12-27 DIAGNOSIS — K5902 Outlet dysfunction constipation: Secondary | ICD-10-CM

## 2019-12-28 DIAGNOSIS — F4310 Post-traumatic stress disorder, unspecified: Secondary | ICD-10-CM | POA: Diagnosis not present

## 2019-12-29 DIAGNOSIS — K580 Irritable bowel syndrome with diarrhea: Secondary | ICD-10-CM | POA: Diagnosis not present

## 2019-12-29 DIAGNOSIS — N390 Urinary tract infection, site not specified: Secondary | ICD-10-CM | POA: Diagnosis not present

## 2020-01-25 DIAGNOSIS — F4310 Post-traumatic stress disorder, unspecified: Secondary | ICD-10-CM | POA: Diagnosis not present

## 2020-01-31 ENCOUNTER — Encounter: Payer: Self-pay | Admitting: Physical Therapy

## 2020-01-31 ENCOUNTER — Other Ambulatory Visit: Payer: Self-pay

## 2020-01-31 ENCOUNTER — Ambulatory Visit: Payer: Medicare Other | Attending: Gastroenterology | Admitting: Physical Therapy

## 2020-01-31 DIAGNOSIS — R278 Other lack of coordination: Secondary | ICD-10-CM | POA: Diagnosis not present

## 2020-01-31 DIAGNOSIS — M6281 Muscle weakness (generalized): Secondary | ICD-10-CM | POA: Diagnosis not present

## 2020-01-31 NOTE — Patient Instructions (Signed)
Fiber Overview Dietary fiber is the part of plants that can't be digested. There are 2 kinds of dietary fiber: insoluble and soluble.    Insoluble fiber adds bulk to keep foods moving through the digestive system. Foods high in insoluble fiber can improve gut motility (wavelike contractions of the intestines) for those experiencing constipation. Soluble fiber holds water, which softens the stool for easy bowel movements. Foods high in soluble fiber can add bulk for those experiencing high frequency or loose stool/diarrhea.   Fiber is an important part of your diet, even though it passes through your body undigested and has no nutritional value.   A high fiber diet can:  promote regular bowel movements treat diverticular disease (inflammation of part of the intestine) and irritable bowel syndrome (abdominal pain, diarrhea, and constipation that come and go) promote improvement in hemorrhoids, constipation and fecal incontinence.  You should have at least 14 grams of fiber for every 1000 calories you eat every day. Read the label on every food package to find out how much fiber a serving of the food will provide. Foods containing more than 20% of the daily value of fiber per serving are considered high in fiber.  Soluble fiber foods: White rice Oatmeal Pasta Mashed potatoes Sweet potatoes Bananas Applesauce Hard cheeses Yogurt   Insoluble fiber foods: whole grain breads and cereals  nuts and seeds raw or dried fruits leafy vegetables like spinach and lettuce fruits and vegetables  Note: caffeine may trigger more bowel movements.  Anal Irritation: this may occur due to high frequency of bowel movements.  To reduce irritation: cleanse anal area with water only pat dry or use a cool hair dryer avoid soap in anal area do not wipe with toilet paper, or use toilet paper moistened with water use a squirt bottle filled with water to cleanse you can experiment with baby wipes or other  personal wipes if you wish, especially when out and using water is not convenient     Strategies for Constipation - Developing a Bowel Routine  Bowels love routine.  Choose the best time of day to have a bowel movement.  Usually the best time of day for a bowel movement in 30 min to 1 hour after breakfast or lunch.  It can take a week or longer to transition to new bowel routines, but if you follow the guidelines below, you will start to retrain your system for more successful bowel routines.  Eat breakfast every morning to try to get things moving along.  Eating stimulates the bowels through turning on the gastrocolic reflex, which are the wave-like contractions of smooth muscle in your intestines to help move feces along.  Adding in a hot beverage with breakfast may help as well.  Think of your digestive tract as being a conveyor belt - new food comes in and pushes along old food, with the end of the line being a bowel movement.  You may add 1/4 cup or 1/3 cup of prune juice every evening to help stimulate morning bowel function until you see more successful bowel function.  Create routine in both your timing and portions of intake of food and drink throughout the day.  Eat each of your meals at consistent times of the day.  For portions, the size of each meal may vary, but try to eat consistent portions each day at breakfast, lunch and dinner.  For example, you may have a small breakfast every day and a big lunch every day. This  is fine if it is a consistent pattern of intake.  With each meal, aim for at least 2 servings of fruits and vegetables and at least one serving of whole grains (whole grain cereal, brown rice, bran, whole wheat or rye bread, oatmeal) to get some fiber in your system.  These foods provide soft bulk for the bowel.  Drink water.  Aim for at least 8 large glasses of water a day.  Water helps the stool stay softer and easier to pass.  If you are adding more fiber in your diet,  be sure to increase your water intake too.  Exercise can help move things along the digestive tract.  Exercise may be a walk, jog, yoga, exercise video, gym time, or even household walking or marching in place at the kitchen countertop.  Pairing exercise and breakfast as part of your morning routine may help establish a morning bowel routine.  Listen to your body's signals that it is time to go to the bathroom!  Our body has automatic reflexes that signal when it is time to have a bowel movement.  If you are establishing routines to help stimulate your bowels, be sure to allow time for toileting.  For example, if you typically have a bowel movement after breakfast, get up early enough to eat AND empty your bowels before your schedule gets too busy or before it becomes inconvenient to access a bathroom.  If we routinely delay or hold back a bowel movement, these reflexes can stop signaling, leading to constipation which may become chronic (long-term).    Ensure proper toileting technique including posture, breathing and emptying strategies.*  Abdominal massage may also help stimulate your bowels and reduce the discomfort that accompanies constipation.*  *Your PT can educate you on toileting strategies and self-massage for your abdomen.  Your PT may also ask you to complete a food log and bowel diary for 1-3 days to help identify patterns and make suggestions for increased success in managing your symptoms.
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
## 2020-02-06 ENCOUNTER — Encounter: Payer: Self-pay | Admitting: Physical Therapy

## 2020-02-06 ENCOUNTER — Ambulatory Visit: Payer: Medicare Other | Admitting: Physical Therapy

## 2020-02-06 ENCOUNTER — Other Ambulatory Visit: Payer: Self-pay

## 2020-02-06 DIAGNOSIS — M6281 Muscle weakness (generalized): Secondary | ICD-10-CM | POA: Diagnosis not present

## 2020-02-06 DIAGNOSIS — R278 Other lack of coordination: Secondary | ICD-10-CM

## 2020-02-06 MED FILL — BUSPIRONE HCL 30 MG TABS: 30 | 90 days supply | Qty: 90 | Fill #0
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
## 2020-02-06 NOTE — Patient Instructions (Addendum)
Access Code: [email or web address] URL: [email or web address]: 09/27/2021Prepared by: Venetia Night BeuhringExercises  Supine Hip Internal and External Rotation - 1 x daily - 7 x weekly - 3 sets - 10 reps  Supine Double Knee to Chest - 1 x daily - 7 x weekly - 3 sets - 5 reps  Sidelying Thoracic Rotation with Open Book - 1 x daily - 7 x weekly - 1 sets - 5 reps  Quadruped Rocking Slow - 1 x daily - 7 x weekly - 1 sets - 10 reps  Supine Bridge with Pelvic Floor Contraction - 1 x daily - 7 x weekly - 2 sets - 10 reps - 3 hold

## 2020-02-13 ENCOUNTER — Other Ambulatory Visit: Payer: Self-pay

## 2020-02-13 ENCOUNTER — Encounter: Payer: Self-pay | Admitting: Physical Therapy

## 2020-02-13 ENCOUNTER — Ambulatory Visit: Payer: Medicare Other | Attending: Gastroenterology | Admitting: Physical Therapy

## 2020-02-13 DIAGNOSIS — M6281 Muscle weakness (generalized): Secondary | ICD-10-CM | POA: Diagnosis not present

## 2020-02-13 DIAGNOSIS — R278 Other lack of coordination: Secondary | ICD-10-CM | POA: Insufficient documentation

## 2020-02-13 NOTE — Patient Instructions (Signed)
Access Code: [email or web address] URL: [email or web address] Date: 02/13/2020 Prepared by: Venetia Night Ondre Salvetti  Exercises Supine Hip Internal and External Rotation - 1 x daily - 7 x weekly - 3 sets - 10 reps Supine Double Knee to Chest - 1 x daily - 7 x weekly - 3 sets - 5 reps Sidelying Thoracic Rotation with Open Book - 1 x daily - 7 x weekly - 1 sets - 5 reps Quadruped Rocking Slow - 1 x daily - 7 x weekly - 1 sets - 10 reps Supine Bridge with Pelvic Floor Contraction - 1 x daily - 7 x weekly - 2 sets - 10 reps - 3 hold Clamshell with Resistance - 1 x daily - 7 x weekly - 2 sets - 10 reps - 3 hold Sit to Stand with Pelvic Floor Contraction - 1 x daily - 7 x weekly - 1 sets - 10 reps Single Leg Stance with Support - 1 x daily - 7 x weekly - 1 sets - 10 reps Seated Pelvic Floor Contraction - 1 x daily - 7 x weekly - 1 sets - 10 reps - 10 hold
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
## 2020-02-20 ENCOUNTER — Encounter: Payer: Medicare Other | Admitting: Physical Therapy

## 2020-02-22 DIAGNOSIS — F4310 Post-traumatic stress disorder, unspecified: Secondary | ICD-10-CM | POA: Diagnosis not present

## 2020-02-27 ENCOUNTER — Encounter: Payer: Medicare Other | Admitting: Physical Therapy

## 2020-03-05 ENCOUNTER — Encounter: Payer: Medicare Other | Admitting: Physical Therapy

## 2020-03-07 DIAGNOSIS — F4310 Post-traumatic stress disorder, unspecified: Secondary | ICD-10-CM | POA: Diagnosis not present

## 2020-03-08 ENCOUNTER — Encounter: Payer: Medicare Other | Admitting: Physical Therapy

## 2020-03-13 ENCOUNTER — Other Ambulatory Visit: Payer: Self-pay | Admitting: Family Medicine

## 2020-03-13 DIAGNOSIS — G250 Essential tremor: Secondary | ICD-10-CM

## 2020-03-20 DIAGNOSIS — L570 Actinic keratosis: Secondary | ICD-10-CM | POA: Diagnosis not present

## 2020-03-20 DIAGNOSIS — L700 Acne vulgaris: Secondary | ICD-10-CM | POA: Diagnosis not present

## 2020-04-09 MED FILL — LORazepam 0.5 MG TABS: 0.5 | 90 days supply | Qty: 45 | Fill #0

## 2020-05-07 MED FILL — BUSPIRONE HCL 30 MG TABS: 30 | 90 days supply | Qty: 90 | Fill #1

## 2020-06-13 DIAGNOSIS — L820 Inflamed seborrheic keratosis: Secondary | ICD-10-CM | POA: Diagnosis not present

## 2020-07-30 MED FILL — BUSPIRONE HCL 30 MG TABS: 30 | 90 days supply | Qty: 90 | Fill #2

## 2020-09-05 DIAGNOSIS — F4310 Post-traumatic stress disorder, unspecified: Secondary | ICD-10-CM | POA: Diagnosis not present

## 2020-10-03 DIAGNOSIS — F4310 Post-traumatic stress disorder, unspecified: Secondary | ICD-10-CM | POA: Diagnosis not present

## 2020-10-17 DIAGNOSIS — F4310 Post-traumatic stress disorder, unspecified: Secondary | ICD-10-CM | POA: Diagnosis not present

## 2020-10-29 ENCOUNTER — Other Ambulatory Visit: Payer: Self-pay

## 2020-10-30 ENCOUNTER — Other Ambulatory Visit (HOSPITAL_COMMUNITY): Payer: Self-pay

## 2020-10-30 MED ORDER — BUSPIRONE HCL 30 MG PO TABS
ORAL_TABLET | ORAL | 0 refills | Status: DC
Start: 2020-10-30 — End: 2021-05-20
  Filled 2020-10-30 – 2020-11-07 (×2): qty 180, 90d supply, fill #0

## 2020-10-31 DIAGNOSIS — F4310 Post-traumatic stress disorder, unspecified: Secondary | ICD-10-CM | POA: Diagnosis not present

## 2020-11-03 ENCOUNTER — Other Ambulatory Visit: Payer: Self-pay | Admitting: Family Medicine

## 2020-11-03 DIAGNOSIS — G250 Essential tremor: Secondary | ICD-10-CM

## 2020-11-04 ENCOUNTER — Other Ambulatory Visit: Payer: Self-pay | Admitting: Family Medicine

## 2020-11-04 DIAGNOSIS — J3489 Other specified disorders of nose and nasal sinuses: Secondary | ICD-10-CM

## 2020-11-07 ENCOUNTER — Other Ambulatory Visit (HOSPITAL_COMMUNITY): Payer: Self-pay

## 2020-11-14 DIAGNOSIS — F4310 Post-traumatic stress disorder, unspecified: Secondary | ICD-10-CM | POA: Diagnosis not present

## 2020-11-21 DIAGNOSIS — L814 Other melanin hyperpigmentation: Secondary | ICD-10-CM | POA: Diagnosis not present

## 2020-11-21 DIAGNOSIS — L700 Acne vulgaris: Secondary | ICD-10-CM | POA: Diagnosis not present

## 2020-11-21 DIAGNOSIS — L821 Other seborrheic keratosis: Secondary | ICD-10-CM | POA: Diagnosis not present

## 2020-11-21 DIAGNOSIS — L820 Inflamed seborrheic keratosis: Secondary | ICD-10-CM | POA: Diagnosis not present

## 2020-11-21 DIAGNOSIS — Z85828 Personal history of other malignant neoplasm of skin: Secondary | ICD-10-CM | POA: Diagnosis not present

## 2020-11-28 DIAGNOSIS — F4310 Post-traumatic stress disorder, unspecified: Secondary | ICD-10-CM | POA: Diagnosis not present

## 2020-12-12 DIAGNOSIS — F4310 Post-traumatic stress disorder, unspecified: Secondary | ICD-10-CM | POA: Diagnosis not present

## 2020-12-12 DIAGNOSIS — Z1231 Encounter for screening mammogram for malignant neoplasm of breast: Secondary | ICD-10-CM | POA: Diagnosis not present

## 2020-12-15 ENCOUNTER — Other Ambulatory Visit: Payer: Self-pay | Admitting: Family Medicine

## 2020-12-15 DIAGNOSIS — G250 Essential tremor: Secondary | ICD-10-CM

## 2020-12-20 DIAGNOSIS — G43809 Other migraine, not intractable, without status migrainosus: Secondary | ICD-10-CM | POA: Diagnosis not present

## 2020-12-20 DIAGNOSIS — H40002 Preglaucoma, unspecified, left eye: Secondary | ICD-10-CM | POA: Diagnosis not present

## 2020-12-20 DIAGNOSIS — H52223 Regular astigmatism, bilateral: Secondary | ICD-10-CM | POA: Diagnosis not present

## 2020-12-20 DIAGNOSIS — H524 Presbyopia: Secondary | ICD-10-CM | POA: Diagnosis not present

## 2020-12-20 DIAGNOSIS — H5213 Myopia, bilateral: Secondary | ICD-10-CM | POA: Diagnosis not present

## 2020-12-20 DIAGNOSIS — H16223 Keratoconjunctivitis sicca, not specified as Sjogren's, bilateral: Secondary | ICD-10-CM | POA: Diagnosis not present

## 2020-12-20 DIAGNOSIS — H2513 Age-related nuclear cataract, bilateral: Secondary | ICD-10-CM | POA: Diagnosis not present

## 2020-12-24 DIAGNOSIS — N390 Urinary tract infection, site not specified: Secondary | ICD-10-CM | POA: Diagnosis not present

## 2020-12-24 DIAGNOSIS — N952 Postmenopausal atrophic vaginitis: Secondary | ICD-10-CM | POA: Diagnosis not present

## 2021-01-09 DIAGNOSIS — F4310 Post-traumatic stress disorder, unspecified: Secondary | ICD-10-CM | POA: Diagnosis not present

## 2021-01-09 DIAGNOSIS — F4323 Adjustment disorder with mixed anxiety and depressed mood: Secondary | ICD-10-CM | POA: Diagnosis not present

## 2021-01-12 ENCOUNTER — Other Ambulatory Visit: Payer: Self-pay | Admitting: Family Medicine

## 2021-01-12 DIAGNOSIS — G250 Essential tremor: Secondary | ICD-10-CM

## 2021-02-06 DIAGNOSIS — F4310 Post-traumatic stress disorder, unspecified: Secondary | ICD-10-CM | POA: Diagnosis not present

## 2021-02-08 ENCOUNTER — Other Ambulatory Visit: Payer: Self-pay | Admitting: Family Medicine

## 2021-02-08 DIAGNOSIS — G250 Essential tremor: Secondary | ICD-10-CM

## 2021-02-12 ENCOUNTER — Other Ambulatory Visit: Payer: Self-pay | Admitting: Family Medicine

## 2021-02-12 DIAGNOSIS — G250 Essential tremor: Secondary | ICD-10-CM

## 2021-03-25 ENCOUNTER — Other Ambulatory Visit: Payer: Self-pay | Admitting: Family Medicine

## 2021-03-25 ENCOUNTER — Telehealth: Payer: Self-pay | Admitting: Family Medicine

## 2021-03-25 DIAGNOSIS — J3489 Other specified disorders of nose and nasal sinuses: Secondary | ICD-10-CM

## 2021-03-25 DIAGNOSIS — G250 Essential tremor: Secondary | ICD-10-CM

## 2021-03-25 MED ORDER — PROPRANOLOL HCL 20 MG PO TABS: 20.0000 mg | ORAL_TABLET | Freq: Two times a day (BID) | ORAL | 0 refills | Status: DC

## 2021-03-25 NOTE — Telephone Encounter (Signed)
I have refilled for 1 more month since she has an appointment

## 2021-03-25 NOTE — Telephone Encounter (Signed)
Previous Rx's have been denied, pt has a pending appointment on 04/01/21. Okay for refill?

## 2021-03-25 NOTE — Telephone Encounter (Signed)
Pt scheduled apt for 11/21  Medication: propranolol (INDERAL) 20 MG tablet   Has the patient contacted their pharmacy? Yes.   (If no, request that the patient contact the pharmacy for the refill.) (If yes, when and what did the pharmacy advise?)  Preferred Pharmacy (with phone number or street name): [id number] [address], [place] [address]  [place], [place] [address]  Phone:  [phone]  Fax:  [phone]   Agent: Please be advised that RX refills may take up to 3 business days. We ask that you follow-up with your pharmacy.

## 2021-03-26 ENCOUNTER — Other Ambulatory Visit (HOSPITAL_COMMUNITY): Payer: Self-pay

## 2021-03-26 MED ORDER — BUSPIRONE HCL 30 MG PO TABS
ORAL_TABLET | Freq: Two times a day (BID) | ORAL | 4 refills | Status: DC
Start: 2021-03-26 — End: 2023-05-29
  Filled 2021-03-26: qty 180, 90d supply, fill #0
  Filled 2021-04-30: qty 14, 7d supply, fill #0
  Filled 2021-04-30: qty 166, 83d supply, fill #0
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
## 2021-04-01 ENCOUNTER — Other Ambulatory Visit: Payer: Self-pay

## 2021-04-01 ENCOUNTER — Telehealth (INDEPENDENT_AMBULATORY_CARE_PROVIDER_SITE_OTHER): Payer: Medicare Other | Admitting: Family Medicine

## 2021-04-01 DIAGNOSIS — G250 Essential tremor: Secondary | ICD-10-CM

## 2021-04-01 MED ORDER — PROPRANOLOL HCL 20 MG PO TABS: 20.0000 mg | ORAL_TABLET | Freq: Two times a day (BID) | ORAL | 1 refills | Status: DC

## 2021-04-03 ENCOUNTER — Other Ambulatory Visit (HOSPITAL_COMMUNITY): Payer: Self-pay

## 2021-04-30 ENCOUNTER — Other Ambulatory Visit (HOSPITAL_COMMUNITY): Payer: Self-pay

## 2021-05-05 ENCOUNTER — Other Ambulatory Visit: Payer: Self-pay | Admitting: Family Medicine

## 2021-05-05 DIAGNOSIS — J3489 Other specified disorders of nose and nasal sinuses: Secondary | ICD-10-CM

## 2021-05-18 NOTE — Patient Instructions (Addendum)
It was good to see you again today, I will be in touch with your labs as soon as possible I ordered a bone density for you- please schedule alone with your next mammo at [facility]! Do work on exercise for your bone density Your current weight is fine- you could gain a few mow lbs if you like I would recommend getting the latest covid booster if not done already
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
## 2021-05-20 ENCOUNTER — Encounter: Payer: Self-pay | Admitting: Family Medicine

## 2021-05-20 ENCOUNTER — Ambulatory Visit (INDEPENDENT_AMBULATORY_CARE_PROVIDER_SITE_OTHER): Payer: Medicare Other | Admitting: Family Medicine

## 2021-05-20 VITALS — BP 108/60 | HR 66 | Temp 97.9°F | Resp 18 | Ht 70.0 in | Wt 151.4 lb

## 2021-05-20 DIAGNOSIS — E559 Vitamin D deficiency, unspecified: Secondary | ICD-10-CM | POA: Diagnosis not present

## 2021-05-20 DIAGNOSIS — M48061 Spinal stenosis, lumbar region without neurogenic claudication: Secondary | ICD-10-CM | POA: Diagnosis not present

## 2021-05-20 DIAGNOSIS — R5383 Other fatigue: Secondary | ICD-10-CM

## 2021-05-20 DIAGNOSIS — R634 Abnormal weight loss: Secondary | ICD-10-CM | POA: Diagnosis not present

## 2021-05-20 DIAGNOSIS — G250 Essential tremor: Secondary | ICD-10-CM

## 2021-05-20 DIAGNOSIS — Z13220 Encounter for screening for lipoid disorders: Secondary | ICD-10-CM | POA: Diagnosis not present

## 2021-05-20 DIAGNOSIS — J3089 Other allergic rhinitis: Secondary | ICD-10-CM

## 2021-05-20 DIAGNOSIS — Z130 Encounter for screening for diseases of the blood and blood-forming organs and certain disorders involving the immune mechanism: Secondary | ICD-10-CM

## 2021-05-20 DIAGNOSIS — R7309 Other abnormal glucose: Secondary | ICD-10-CM | POA: Diagnosis not present

## 2021-05-20 DIAGNOSIS — E2839 Other primary ovarian failure: Secondary | ICD-10-CM | POA: Diagnosis not present

## 2021-05-20 LAB — LIPID PANEL
Cholesterol: 229 mg/dL — ABNORMAL HIGH (ref 0–200)
HDL: 53.8 mg/dL (ref >39.00)
LDL Cholesterol: 152 mg/dL — ABNORMAL HIGH (ref 0–99)
NonHDL: 175.56
Total CHOL/HDL Ratio: 4
Triglycerides: 118 mg/dL (ref 0.0–149.0)
VLDL: 23.6 mg/dL (ref 0.0–40.0)

## 2021-05-20 LAB — COMPREHENSIVE METABOLIC PANEL
ALT: 15 U/L (ref 0–35)
AST: 21 U/L (ref 0–37)
Albumin: 4.4 g/dL (ref 3.5–5.2)
Alkaline Phosphatase: 41 U/L (ref 39–117)
BUN: 10 mg/dL (ref 6–23)
CO2: 33 mEq/L — ABNORMAL HIGH (ref 19–32)
Calcium: 9.2 mg/dL (ref 8.4–10.5)
Chloride: 102 mEq/L (ref 96–112)
Creatinine, Ser: 0.72 mg/dL (ref 0.40–1.20)
GFR: 85.9 mL/min (ref >60.00)
Glucose, Bld: 92 mg/dL (ref 70–99)
Potassium: 4.7 mEq/L (ref 3.5–5.1)
Sodium: 142 mEq/L (ref 135–145)
Total Bilirubin: 0.8 mg/dL (ref 0.2–1.2)
Total Protein: 6.9 g/dL (ref 6.0–8.3)

## 2021-05-20 LAB — TSH: TSH: 2.2 u[IU]/mL (ref 0.35–5.50)

## 2021-05-20 LAB — CBC
HCT: 40.1 % (ref 36.0–46.0)
Hemoglobin: 13.3 g/dL (ref 12.0–15.0)
MCHC: 33.1 g/dL (ref 30.0–36.0)
MCV: 93.5 fl (ref 78.0–100.0)
Platelets: 238 10*3/uL (ref 150.0–400.0)
RBC: 4.29 Mil/uL (ref 3.87–5.11)
RDW: 13 % (ref 11.5–15.5)
WBC: 4.8 10*3/uL (ref 4.0–10.5)

## 2021-05-20 LAB — VITAMIN D 25 HYDROXY (VIT D DEFICIENCY, FRACTURES): VITD: 63.32 ng/mL (ref 30.00–100.00)

## 2021-05-20 LAB — HEMOGLOBIN A1C: Hgb A1c MFr Bld: 5.3 % (ref 4.6–6.5)

## 2021-05-20 MED ORDER — FLUTICASONE PROPIONATE 50 MCG/ACT NA SUSP: 2.0000 | Freq: Every evening | NASAL | 4 refills | Status: DC | PRN

## 2021-08-14 ENCOUNTER — Telehealth: Payer: Self-pay | Admitting: Family Medicine

## 2021-08-14 NOTE — Telephone Encounter (Signed)
Left message for patient to call back and schedule Medicare Annual Wellness Visit (AWV) in office.  ° °If not able to come in office, please offer to do virtually or by telephone.  Left office number and my jabber #[phone]. ° °Due for AWVI ° °Please schedule at anytime with Nurse Health Advisor. °  °

## 2021-08-20 ENCOUNTER — Ambulatory Visit (INDEPENDENT_AMBULATORY_CARE_PROVIDER_SITE_OTHER): Payer: Medicare Other

## 2021-08-20 VITALS — Ht 70.0 in | Wt 150.0 lb

## 2021-08-20 DIAGNOSIS — Z0000 Encounter for general adult medical examination without abnormal findings: Secondary | ICD-10-CM

## 2021-08-20 NOTE — Progress Notes (Signed)
? ?Subjective:  ? Angela Ray is a 71 y.o. female who presents for an Initial Medicare Annual Wellness Visit. ? ?I connected with [name] today by telephone and verified that I am speaking with the correct person using two identifiers. ?Location patient: home ?Location provider: work ?Persons participating in the virtual visit: patient, nurse.  ?  ?I discussed the limitations, risks, security and privacy concerns of performing an evaluation and management service by telephone and the availability of in person appointments. I also discussed with the patient that there may be a patient responsible charge related to this service. The patient expressed understanding and verbally consented to this telephonic visit.  ?  ?Interactive audio and video telecommunications were attempted between this provider and patient, however failed, due to patient having technical difficulties OR patient did not have access to video capability.  We continued and completed visit with audio only. ? ?Some vital signs may be absent or patient reported.  ? ?Time Spent with patient on telephone encounter: 25 minutes ? ? ?Review of Systems    ? ?Cardiac Risk Factors include: advanced age (>34mn, >>53women);dyslipidemia ? ?   ?Objective:  ?  ?Today's Vitals  ? 08/20/21 0932  ?Weight: 150 lb (68 kg)  ?Height: 5' 10"  (1.778 m)  ? ?Body mass index is 21.52 kg/m?. ? ? ?  08/20/2021  ?  9:37 AM 12/16/2019  ?  9:38 AM 10/23/2018  ?  9:01 PM 10/09/2017  ?  6:47 AM 04/04/2015  ?  8:58 AM 10/14/2014  ?  1:01 PM  ?Advanced Directives  ?Does Patient Have a Medical Advance Directive? Yes Yes Yes No Yes Yes  ?Type of AParamedicof [place]Living will [place]Living will [place]Living will  [place] will [place] will  ?Does patient want to make changes to medical advance directive?  No - Patient declined No - Patient declined   No -  Patient declined  ?Copy of [place] Chart? Yes - validated most recent copy scanned in chart (See row information) No - copy requested No - copy requested   No - copy requested  ?Would patient like information on creating a medical advance directive?   No - Patient declined     ? ? ?Current Medications (verified) ?Outpatient Encounter Medications as of 08/20/2021  ?Medication Sig  ? Azelaic Acid 15 % cream Apply 1 application topically every morning.  ? busPIRone (BUSPAR) 30 MG tablet Take 1 tablet by mouth 2 times a day.  ? cetirizine (ZYRTEC) 10 MG tablet Take 10 mg by mouth daily as needed for allergies.   ? clonazePAM (KLONOPIN) 1 MG tablet Take 1 tablet by mouth 3 (three) times daily.  ? Cyanocobalamin (B-12) 2500 MCG TABS Take by mouth daily.  ? cyclobenzaprine (FLEXERIL) 10 MG tablet TAKE 1 TABLET BY MOUTH TWICE A DAY AS NEEDED FOR MUSCLE SPASMS  ? ESTRACE VAGINAL 0.1 MG/GM vaginal cream Apply 1 application topically daily. Apply small amount externally to vaginal area daily  ? fluticasone (CUTIVATE) 0.05 % cream Apply 1 application topically 2 (two) times daily.  ? fluticasone (FLONASE) 50 MCG/ACT nasal spray Place 2 sprays into both nostrils at bedtime as needed for allergies.  ? gabapentin (NEURONTIN) 300 MG capsule TAKE 1 CAPSULE (300 MG TOTAL) BY MOUTH AT BEDTIME.  ? hydrocortisone (ANUSOL-HC) 2.5 % rectal cream Place 1 application rectally 2 (two) times daily.  ? ibuprofen (ADVIL,MOTRIN) 600  MG tablet TAKE 1 TABLET (600 MG TOTAL) BY MOUTH EVERY 8 (EIGHT) HOURS AS NEEDED (PAIN).  ? lidocaine (XYLOCAINE) 5 % ointment Apply daily as needed  ? Lifitegrast (XIIDRA) 5 % SOLN Apply 1 drop to eye daily.  ? Multiple Vitamins-Minerals (HAIR/SKIN/NAILS/BIOTIN PO) Take by mouth daily.  ? OVER THE COUNTER MEDICATION Take 1 capsule by mouth 3 (three) times daily. Hydro eye  ? PARoxetine (PAXIL) 20 MG tablet Take 20 mg by mouth 2 (two) times daily.  ? propranolol (INDERAL) 20 MG tablet Take 1  tablet (20 mg total) by mouth 2 (two) times daily.  ? tretinoin (RETIN-A) 0.025 % cream Apply 1 application topically at bedtime.  ? trimethoprim (TRIMPEX) 100 MG tablet Take 1 tablet by mouth daily.  ? ?No facility-administered encounter medications on file as of 08/20/2021.  ? ? ?Allergies (verified) ?Aciphex [rabeprazole sodium], Bupropion hcl, Cephalexin, Codeine, Esomeprazole magnesium, Sertraline hcl, Linzess [linaclotide], Prednisone, and Doxycycline hyclate  ? ?History: ?Past Medical History:  ?Diagnosis Date  ? Anxiety   ? Barrett's esophagus   ? in the past  ? Colon polyp   ? Depression   ? Dry eye syndrome of both eyes   ? Dupuytren's contracture of both hands 10/2016  ? sees Dr.Gramig--[facility]  ? Essential tremor 2016  ? legs, arm,lower jaw  ? Female bladder prolapse   ? Fibroid   ? Fibromyalgia   ? GERD (gastroesophageal reflux disease)   ? Heat exhaustion   ? Herniated disc, cervical   ? C3  ? Hiatal hernia   ? History of cardiovascular stress test   ? Lexiscan Myoview 7/16:  EF 65%, inferolateral defect (prob artifactual), no ischemia  ? History of recurrent UTIs   ? Hyperlipidemia   ? IBS (irritable bowel syndrome)   ? constipation  ? Leg cramps   ? Low back pain   ? Lumbar herniated disc   ? L4,L5  ? Osteoarthritis   ? Osteopenia   ? Osteoporosis   ? Osteopenia per pt  ? Rheumatoid arthritis, adult ([place]) 2016  ? both feet  ? Skin cancer   ? Spinal stenosis   ? lumbar  ? Tremors of nervous system   ? Vertigo   ? ?Past Surgical History:  ?Procedure Laterality Date  ? ABDOMINAL HYSTERECTOMY  2003  ? TAH still has ovaries--[name]  ? ANAL RECTAL MANOMETRY [name] 12/23/2019  ? Procedure: ANO RECTAL MANOMETRY;  Surgeon: Mauri Pole, MD;  Location: [facility];  Service: Endoscopy;  Laterality: [name];  ? CERVICAL LAMINECTOMY  2002  ? C5/C6  ? CHOLECYSTECTOMY  2008  ? fractured ankle Left 10/2014  ? ROTATOR CUFF REPAIR Right 2011  ? ?Family History  ?Problem Relation Age of Onset  ? Coronary  artery disease Father 70  ?     CABG 2002, followd by Dr, Ron Parker  ? Prostate cancer Father   ? Bladder Cancer Father   ? Skin cancer Father   ? Diabetes Father 27  ?     DM type 2  ? Atrial fibrillation Mother   ? Multiple myeloma Mother   ? Skin cancer Mother   ? Coronary artery disease Mother   ? Irritable bowel syndrome Mother   ? Diabetes Maternal Grandmother   ? Hypertension Maternal Grandmother   ? Diabetes Maternal Grandfather   ? Hypertension Maternal Grandfather   ? Osteoarthritis Paternal Grandmother   ? Cancer Maternal Aunt   ?     bone cancer  ?  Colon cancer Neg Hx   ? Esophageal cancer Neg Hx   ? Stomach cancer Neg Hx   ? Rectal cancer Neg Hx   ? ?Social History  ? ?Socioeconomic History  ? Marital status: Married  ?  Spouse name: Not on file  ? Number of children: 0  ? Years of education: 41  ? Highest education level: Not on file  ?Occupational History  ? Occupation: Retired  ?  Employer: RETIRED  ?Tobacco Use  ? Smoking status: Never  ? Smokeless tobacco: Never  ?Vaping Use  ? Vaping Use: Never used  ?Substance and Sexual Activity  ? Alcohol use: No  ?  Alcohol/week: 0.0 standard drinks  ?  Comment: occ glass of wine  ? Drug use: No  ? Sexual activity: Not Currently  ?  Partners: Male  ?  Birth control/protection: Surgical  ?  Comment: TAH still has ovaries  ?Other Topics Concern  ? Not on file  ?Social History Narrative  ? Lives at home with her husband.  ? Right-handed.  ? Rarely uses caffeine.  ? ?Social Determinants of Health  ? ?Financial Resource Strain: Low Risk   ? Difficulty of Paying Living Expenses: Not hard at all  ?Food Insecurity: No Food Insecurity  ? Worried About Charity fundraiser in the Last Year: Never true  ? Ran Out of Food in the Last Year: Never true  ?Transportation Needs: No Transportation Needs  ? Lack of Transportation (Medical): No  ? Lack of Transportation (Non-Medical): No  ?Physical Activity: Inactive  ? Days of Exercise per Week: 0 days  ? Minutes of Exercise per  Session: 0 min  ?Stress: No Stress Concern Present  ? Feeling of Stress : Only a little  ?Social Connections: Socially Integrated  ? Frequency of Communication with Friends and Family: More than three times a week  ?

## 2021-08-20 NOTE — Patient Instructions (Signed)
[name] , ?Thank you for taking time to complete your Medicare Wellness Visit. I appreciate your ongoing commitment to your health goals. Please review the following plan we discussed and let me know if I can assist you in the future.  ? ?Screening recommendations/referrals: ?Colonoscopy: Completed 06/07/2019-Due 06/06/2022 ?Mammogram: Scheduled ?Bone Density: Scheduled ?Recommended yearly ophthalmology/optometry visit for glaucoma screening and checkup ?Recommended yearly dental visit for hygiene and checkup ? ?Vaccinations: ?Influenza vaccine: Up to date ?Pneumococcal vaccine: Up to date ?Tdap vaccine: Up to date ?Shingles vaccine: Completed vaccines   ?Covid-19:Declined booster ? ?Advanced directives: Copy in chart ? ?Conditions/risks identified: See problem list ? ?Next appointment: Follow up in one year for your annual wellness visit  ? ? ?Preventive Care 69 Years and Older, Female ?Preventive care refers to lifestyle choices and visits with your health care provider that can promote health and wellness. ?What does preventive care include? ?A yearly physical exam. This is also called an annual well check. ?Dental exams once or twice a year. ?Routine eye exams. Ask your health care provider how often you should have your eyes checked. ?Personal lifestyle choices, including: ?Daily care of your teeth and gums. ?Regular physical activity. ?Eating a healthy diet. ?Avoiding tobacco and drug use. ?Limiting alcohol use. ?Practicing safe sex. ?Taking low-dose aspirin every day. ?Taking vitamin and mineral supplements as recommended by your health care provider. ?What happens during an annual well check? ?The services and screenings done by your health care provider during your annual well check will depend on your age, overall health, lifestyle risk factors, and family history of disease. ?Counseling  ?Your health care provider may ask you questions about your: ?Alcohol use. ?Tobacco use. ?Drug use. ?Emotional  well-being. ?Home and relationship well-being. ?Sexual activity. ?Eating habits. ?History of falls. ?Memory and ability to understand (cognition). ?Work and work Statistician. ?Reproductive health. ?Screening  ?You may have the following tests or measurements: ?Height, weight, and BMI. ?Blood pressure. ?Lipid and cholesterol levels. These may be checked every 5 years, or more frequently if you are over 54 years old. ?Skin check. ?Lung cancer screening. You may have this screening every year starting at age 69 if you have a 30-pack-year history of smoking and currently smoke or have quit within the past 15 years. ?Fecal occult blood test (FOBT) of the stool. You may have this test every year starting at age 54. ?Flexible sigmoidoscopy or colonoscopy. You may have a sigmoidoscopy every 5 years or a colonoscopy every 10 years starting at age 58. ?Hepatitis C blood test. ?Hepatitis B blood test. ?Sexually transmitted disease (STD) testing. ?Diabetes screening. This is done by checking your blood sugar (glucose) after you have not eaten for a while (fasting). You may have this done every 1-3 years. ?Bone density scan. This is done to screen for osteoporosis. You may have this done starting at age 63. ?Mammogram. This may be done every 1-2 years. Talk to your health care provider about how often you should have regular mammograms. ?Talk with your health care provider about your test results, treatment options, and if necessary, the need for more tests. ?Vaccines  ?Your health care provider may recommend certain vaccines, such as: ?Influenza vaccine. This is recommended every year. ?Tetanus, diphtheria, and acellular pertussis (Tdap, Td) vaccine. You may need a Td booster every 10 years. ?Zoster vaccine. You may need this after age 29. ?Pneumococcal 13-valent conjugate (PCV13) vaccine. One dose is recommended after age 37. ?Pneumococcal polysaccharide (PPSV23) vaccine. One dose is recommended after age  52. ?Talk to your  health care provider about which screenings and vaccines you need and how often you need them. ?This information is not intended to replace advice given to you by your health care provider. Make sure you discuss any questions you have with your health care provider. ?Document Released: 05/25/2015 Document Revised: 01/16/2016 Document Reviewed: 02/27/2015 ?Elsevier Interactive Patient Education ? 2017 [employer]. ? ?Fall Prevention in the Home ?Falls can cause injuries. They can happen to people of all ages. There are many things you can do to make your home safe and to help prevent falls. ?What can I do on the outside of my home? ?Regularly fix the edges of walkways and driveways and fix any cracks. ?Remove anything that might make you trip as you walk through a door, such as a raised step or threshold. ?Trim any bushes or trees on the path to your home. ?Use bright outdoor lighting. ?Clear any walking paths of anything that might make someone trip, such as rocks or tools. ?Regularly check to see if handrails are loose or broken. Make sure that both sides of any steps have handrails. ?Any raised decks and porches should have guardrails on the edges. ?Have any leaves, snow, or ice cleared regularly. ?Use sand or salt on walking paths during winter. ?Clean up any spills in your garage right away. This includes oil or grease spills. ?What can I do in the bathroom? ?Use night lights. ?Install grab bars by the toilet and in the tub and shower. Do not use towel bars as grab bars. ?Use non-skid mats or decals in the tub or shower. ?If you need to sit down in the shower, use a plastic, non-slip stool. ?Keep the floor dry. Clean up any water that spills on the floor as soon as it happens. ?Remove soap buildup in the tub or shower regularly. ?Attach bath mats securely with double-sided non-slip rug tape. ?Do not have throw rugs and other things on the floor that can make you trip. ?What can I do in the bedroom? ?Use night  lights. ?Make sure that you have a light by your bed that is easy to reach. ?Do not use any sheets or blankets that are too big for your bed. They should not hang down onto the floor. ?Have a firm chair that has side arms. You can use this for support while you get dressed. ?Do not have throw rugs and other things on the floor that can make you trip. ?What can I do in the kitchen? ?Clean up any spills right away. ?Avoid walking on wet floors. ?Keep items that you use a lot in easy-to-reach places. ?If you need to reach something above you, use a strong step stool that has a grab bar. ?Keep electrical cords out of the way. ?Do not use floor polish or wax that makes floors slippery. If you must use wax, use non-skid floor wax. ?Do not have throw rugs and other things on the floor that can make you trip. ?What can I do with my stairs? ?Do not leave any items on the stairs. ?Make sure that there are handrails on both sides of the stairs and use them. Fix handrails that are broken or loose. Make sure that handrails are as long as the stairways. ?Check any carpeting to make sure that it is firmly attached to the stairs. Fix any carpet that is loose or worn. ?Avoid having throw rugs at the top or bottom of the stairs. If you do have  throw rugs, attach them to the floor with carpet tape. ?Make sure that you have a light switch at the top of the stairs and the bottom of the stairs. If you do not have them, ask someone to add them for you. ?What else can I do to help prevent falls? ?Wear shoes that: ?Do not have high heels. ?Have rubber bottoms. ?Are comfortable and fit you well. ?Are closed at the toe. Do not wear sandals. ?If you use a stepladder: ?Make sure that it is fully opened. Do not climb a closed stepladder. ?Make sure that both sides of the stepladder are locked into place. ?Ask someone to hold it for you, if possible. ?Clearly mark and make sure that you can see: ?Any grab bars or handrails. ?First and last  steps. ?Where the edge of each step is. ?Use tools that help you move around (mobility aids) if they are needed. These include: ?Canes. ?Walkers. ?Scooters. ?Crutches. ?Turn on the lights when you go into a dark are

## 2021-09-27 ENCOUNTER — Ambulatory Visit (HOSPITAL_BASED_OUTPATIENT_CLINIC_OR_DEPARTMENT_OTHER)
Admission: RE | Admit: 2021-09-27 | Discharge: 2021-09-27 | Disposition: A | Discharge status: Home / Self Care | Payer: Medicare Other | Source: Ambulatory Visit | Attending: Family Medicine | Admitting: Family Medicine

## 2021-09-27 ENCOUNTER — Ambulatory Visit (INDEPENDENT_AMBULATORY_CARE_PROVIDER_SITE_OTHER): Payer: Medicare Other | Admitting: Family Medicine

## 2021-09-27 ENCOUNTER — Other Ambulatory Visit (HOSPITAL_BASED_OUTPATIENT_CLINIC_OR_DEPARTMENT_OTHER): Payer: Self-pay

## 2021-09-27 ENCOUNTER — Encounter: Payer: Self-pay | Admitting: Family Medicine

## 2021-09-27 ENCOUNTER — Telehealth: Payer: Self-pay

## 2021-09-27 VITALS — BP 98/70 | HR 60 | Temp 98.0°F | Resp 18 | Ht 70.0 in | Wt 149.0 lb

## 2021-09-27 DIAGNOSIS — M4722 Other spondylosis with radiculopathy, cervical region: Secondary | ICD-10-CM | POA: Diagnosis not present

## 2021-09-27 DIAGNOSIS — M533 Sacrococcygeal disorders, not elsewhere classified: Secondary | ICD-10-CM

## 2021-09-27 DIAGNOSIS — M545 Low back pain, unspecified: Secondary | ICD-10-CM | POA: Diagnosis not present

## 2021-09-27 MED ORDER — CYCLOBENZAPRINE HCL 10 MG PO TABS
ORAL_TABLET | ORAL | 2 refills | Status: DC
  Filled 2021-09-27: qty 30, 15d supply, fill #0

## 2021-09-27 MED ORDER — TRAMADOL HCL 50 MG PO TABS
50.0000 mg | ORAL_TABLET | Freq: Three times a day (TID) | ORAL | 0 refills | Status: AC | PRN
  Filled 2021-09-27: qty 15, 5d supply, fill #0
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
## 2021-09-27 NOTE — Patient Instructions (Signed)
Tailbone Injury  The tailbone (coccyx) is the small bone at the lower end of the spine. A tailbone injury may involve stretched ligaments, bruising, or a broken bone (fracture). Tailbone injuries can be painful, and some may take a long time to heal. What are the causes? This condition may be caused by: Falling and landing on the tailbone. Repeated strain or friction from sitting for long periods of time. This may include actions such as rowing and bicycling. Childbirth. In some cases, the cause may not be known. What are the signs or symptoms? Symptoms of this condition include: Pain in the tailbone area or lower back, especially when sitting. Pain or difficulty when standing up from a sitting position. Bruising or swelling in the tailbone area. Painful bowel movements. In women, pain during intercourse. How is this diagnosed? This condition may be diagnosed based on: Your symptoms. A physical exam. If your health care provider suspects a fracture, you may have additional tests, such as: X-rays. CT scan. MRI. How is this treated? Most tailbone injuries heal on their own in 4-6 weeks. However, recovery time may be longer if the injury involves a fracture. Treatment for this condition may include: NSAIDs or other over-the-counter medicines to help relieve your pain. Using a large, rubber or inflated ring or cushion to take pressure off the tailbone when sitting. Physical therapy. Injecting the tailbone area with local anesthesia and steroid medicine. This is not normally needed unless the pain does not improve over time with over-the-counter pain medicines. Follow these instructions at home: Activity Avoid sitting for long periods of time. To prevent repeating an injury that is caused by strain or friction: Wear appropriate padding and sports gear when bicycling and rowing. Increase your activity as the pain allows. Perform any exercises that are recommended by your health care  provider or physical therapist. Managing pain, stiffness, and swelling To help decrease discomfort when sitting: Sit on your rubber or inflated ring or cushion as told by your health care provider. Lean forward when you sit. If directed, apply ice to the injured area: Put ice in a plastic bag. Place a towel between your skin and the bag. Leave the ice on for 20 minutes, 2-3 times per day for the first 1-2 days. If directed, apply heat to the affected area as often as told by your health care provider. Use the heat source that your health care provider recommends, such as a moist heat pack or a heating pad. Place a towel between your skin and the heat source. Leave the heat on for 20-30 minutes. Remove the heat if your skin turns bright red. This is especially important if you are unable to feel pain, heat, or cold. You may have a greater risk of getting burned. General instructions Take over-the-counter and prescription medicines only as told by your health care provider. To prevent or treat constipation or painful bowel movements, your health care provider may recommend that you: Drink enough fluid to keep your urine pale yellow. Eat foods that are high in fiber, such as fresh fruits and vegetables, whole grains, and beans. Limit foods that are high in fat and processed sugars, such as fried and sweet foods. Take an over-the-counter or prescription medicine for constipation. Keep all follow-up visits as directed by your health care provider. This is important. Contact a health care provider if: Your pain becomes worse or is not controlled with medicine. Your bowel movements cause a great deal of discomfort. You are unable to have  a bowel movement after 4 days. You have pain during intercourse. Summary A tailbone injury may involve stretched ligaments, bruising, or a broken bone (fracture). Tailbone injuries can be painful. Most heal on their own in 4-6 weeks. Treatment may include  taking NSAIDs, using a rubber or inflated ring or cushion when sitting, and physical therapy. Follow any recommendations from your health care provider to prevent or treat constipation. This information is not intended to replace advice given to you by your health care provider. Make sure you discuss any questions you have with your health care provider. Document Revised: 06/11/2021 Document Reviewed: 02/25/2021 Elsevier Patient Education  [employer].

## 2021-10-08 NOTE — Patient Instructions (Incomplete)
It was great to see you again today!  
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
## 2021-10-10 ENCOUNTER — Ambulatory Visit: Payer: Medicare Other | Admitting: Family Medicine

## 2021-10-17 ENCOUNTER — Other Ambulatory Visit: Payer: Self-pay | Admitting: Family Medicine

## 2021-10-17 DIAGNOSIS — G250 Essential tremor: Secondary | ICD-10-CM

## 2021-11-18 DIAGNOSIS — L570 Actinic keratosis: Secondary | ICD-10-CM | POA: Diagnosis not present

## 2021-11-18 DIAGNOSIS — L820 Inflamed seborrheic keratosis: Secondary | ICD-10-CM | POA: Diagnosis not present

## 2021-11-18 DIAGNOSIS — L649 Androgenic alopecia, unspecified: Secondary | ICD-10-CM | POA: Diagnosis not present

## 2021-11-18 DIAGNOSIS — L700 Acne vulgaris: Secondary | ICD-10-CM | POA: Diagnosis not present

## 2021-11-18 DIAGNOSIS — L821 Other seborrheic keratosis: Secondary | ICD-10-CM | POA: Diagnosis not present

## 2021-12-18 DIAGNOSIS — Z1231 Encounter for screening mammogram for malignant neoplasm of breast: Secondary | ICD-10-CM | POA: Diagnosis not present

## 2021-12-20 DIAGNOSIS — H16223 Keratoconjunctivitis sicca, not specified as Sjogren's, bilateral: Secondary | ICD-10-CM | POA: Diagnosis not present

## 2021-12-20 DIAGNOSIS — G43809 Other migraine, not intractable, without status migrainosus: Secondary | ICD-10-CM | POA: Diagnosis not present

## 2021-12-20 DIAGNOSIS — H2513 Age-related nuclear cataract, bilateral: Secondary | ICD-10-CM | POA: Diagnosis not present

## 2021-12-20 DIAGNOSIS — H40002 Preglaucoma, unspecified, left eye: Secondary | ICD-10-CM | POA: Diagnosis not present

## 2021-12-24 DIAGNOSIS — N390 Urinary tract infection, site not specified: Secondary | ICD-10-CM | POA: Diagnosis not present

## 2021-12-24 DIAGNOSIS — K580 Irritable bowel syndrome with diarrhea: Secondary | ICD-10-CM | POA: Diagnosis not present

## 2021-12-25 ENCOUNTER — Encounter: Payer: Self-pay | Admitting: Family Medicine

## 2021-12-25 ENCOUNTER — Ambulatory Visit (HOSPITAL_BASED_OUTPATIENT_CLINIC_OR_DEPARTMENT_OTHER)
Admission: RE | Admit: 2021-12-25 | Discharge: 2021-12-25 | Disposition: A | Discharge status: Home / Self Care | Payer: Medicare Other | Source: Ambulatory Visit | Attending: Family Medicine | Admitting: Family Medicine

## 2021-12-25 ENCOUNTER — Ambulatory Visit (INDEPENDENT_AMBULATORY_CARE_PROVIDER_SITE_OTHER): Payer: Medicare Other | Admitting: Family Medicine

## 2021-12-25 VITALS — BP 103/57 | HR 73 | Ht 70.0 in | Wt 151.2 lb

## 2021-12-25 DIAGNOSIS — Z9049 Acquired absence of other specified parts of digestive tract: Secondary | ICD-10-CM | POA: Diagnosis not present

## 2021-12-25 DIAGNOSIS — M545 Low back pain, unspecified: Secondary | ICD-10-CM | POA: Insufficient documentation

## 2021-12-25 DIAGNOSIS — R109 Unspecified abdominal pain: Secondary | ICD-10-CM | POA: Diagnosis not present

## 2021-12-25 DIAGNOSIS — R310 Gross hematuria: Secondary | ICD-10-CM | POA: Diagnosis not present

## 2021-12-25 DIAGNOSIS — Z90710 Acquired absence of both cervix and uterus: Secondary | ICD-10-CM | POA: Diagnosis not present

## 2021-12-25 LAB — POC URINALSYSI DIPSTICK (AUTOMATED)
Bilirubin, UA: NEGATIVE
Glucose, UA: NEGATIVE
Ketones, UA: NEGATIVE
Nitrite, UA: NEGATIVE
Protein, UA: POSITIVE — AB
Spec Grav, UA: <=1.005 — AB (ref 1.010–1.025)
Urobilinogen, UA: 0.2 E.U./dL
pH, UA: 5 (ref 5.0–8.0)

## 2021-12-25 NOTE — Patient Instructions (Signed)
Urine sample - large blood, trace protein, and trace leuks. Sending for culture.  Adding CT renal study to rule out kidney stones.  We will update you with results as they come in. Make sure you are staying well hydrated.

## 2021-12-25 NOTE — Progress Notes (Signed)
Acute Office Visit  Subjective:     Patient ID: Angela Ray, female    DOB: Sep 13, 1950, 71 y.o.   MRN: [id number]  CC: Urinary symptoms   Urinary Tract Infection  This is a new problem. The current episode started today. The problem occurs every urination. The quality of the pain is described as burning, stabbing and aching. The pain is at a severity of 2/10 (was much worse this morning). The pain is mild. There has been no fever. Associated symptoms include chills, frequency, hematuria and urgency. Pertinent negatives include no discharge, flank pain, hesitancy, nausea, possible pregnancy, sweats or vomiting. She has tried increased fluids for the symptoms. Her past medical history is significant for recurrent UTIs.   Reports she woke up this morning and had severe pain with urinating. She started by just seeing small specs of blood when wiping (this has never happened with prior UTIs) and it progressed to passing small blood clots. She was also having significantly worse low back pain (hx of DDD) and chills. Symptoms have started easing up and she has not seen any more blood this afternoon. Reports mom had history of recurrent UTIs and kidney stones. Patient has history of recurrent UTIs, she saw urology yesterday and was feeling fine.   All review of systems negative except what is listed in the HPI       Objective:    BP (!) 103/57   Pulse 73   Ht '5\' 10"'$  (1.778 m)   Wt 151 lb 3.2 oz (68.6 kg)   LMP 02/09/2002 (Approximate)   BMI 21.69 kg/m    Physical Exam Vitals reviewed.  Constitutional:      Appearance: Normal appearance.  Cardiovascular:     Rate and Rhythm: Normal rate and regular rhythm.  Pulmonary:     Effort: Pulmonary effort is normal.     Breath sounds: Normal breath sounds.  Abdominal:     General: Abdomen is flat. Bowel sounds are normal. There is no distension.     Palpations: Abdomen is soft. There is no mass.     Tenderness: There is no abdominal  tenderness. There is no right CVA tenderness, left CVA tenderness, guarding or rebound.  Skin:    General: Skin is warm and dry.  Neurological:     General: No focal deficit present.     Mental Status: She is alert and oriented to person, place, and time. Mental status is at baseline.  Psychiatric:        Mood and Affect: Mood normal.        Behavior: Behavior normal.        Thought Content: Thought content normal.        Judgment: Judgment normal.         Results for orders placed or performed in visit on 12/25/21  POCT Urinalysis Dipstick (Automated)  Result Value Ref Range   Color, UA pink    Clarity, UA cloudy    Glucose, UA Negative Negative   Bilirubin, UA neg    Ketones, UA neg    Spec Grav, UA <=1.005 (A) 1.010 - 1.025   Blood, UA large    pH, UA 5.0 5.0 - 8.0   Protein, UA Positive (A) Negative   Urobilinogen, UA 0.2 0.2 or 1.0 E.U./dL   Nitrite, UA neg    Leukocytes, UA Trace (A) Negative        Assessment & Plan:   1. Gross hematuria 2. Acute bilateral low back pain  without sciatica Urine sample - large blood, trace protein, and trace leuks. Sending for culture.  Adding CT renal study to rule out kidney stones.  We will update you with results as they come in. Make sure you are staying well hydrated.   Patient aware of signs/symptoms requiring further/urgent evaluation.   - POCT Urinalysis Dipstick (Automated) - Urine Culture - CT RENAL STONE STUDY; Future - Comprehensive metabolic panel - CBC    No orders of the defined types were placed in this encounter.   Return if symptoms worsen or fail to improve.  Terrilyn Saver, NP

## 2021-12-26 LAB — COMPREHENSIVE METABOLIC PANEL
ALT: 16 U/L (ref 0–35)
AST: 24 U/L (ref 0–37)
Albumin: 4.5 g/dL (ref 3.5–5.2)
Alkaline Phosphatase: 38 U/L — ABNORMAL LOW (ref 39–117)
BUN: 11 mg/dL (ref 6–23)
CO2: 31 mEq/L (ref 19–32)
Calcium: 9.2 mg/dL (ref 8.4–10.5)
Chloride: 96 mEq/L (ref 96–112)
Creatinine, Ser: 0.76 mg/dL (ref 0.40–1.20)
GFR: 80.16 mL/min (ref >60.00)
Glucose, Bld: 106 mg/dL — ABNORMAL HIGH (ref 70–99)
Potassium: 3.9 mEq/L (ref 3.5–5.1)
Sodium: 139 mEq/L (ref 135–145)
Total Bilirubin: 0.6 mg/dL (ref 0.2–1.2)
Total Protein: 7 g/dL (ref 6.0–8.3)

## 2021-12-26 LAB — CBC
HCT: 41.2 % (ref 36.0–46.0)
Hemoglobin: 13.7 g/dL (ref 12.0–15.0)
MCHC: 33.4 g/dL (ref 30.0–36.0)
MCV: 95.4 fl (ref 78.0–100.0)
Platelets: 199 10*3/uL (ref 150.0–400.0)
RBC: 4.31 Mil/uL (ref 3.87–5.11)
RDW: 13.1 % (ref 11.5–15.5)
WBC: 11.6 10*3/uL — ABNORMAL HIGH (ref 4.0–10.5)

## 2021-12-27 DIAGNOSIS — R399 Unspecified symptoms and signs involving the genitourinary system: Secondary | ICD-10-CM | POA: Diagnosis not present

## 2021-12-27 DIAGNOSIS — N952 Postmenopausal atrophic vaginitis: Secondary | ICD-10-CM | POA: Diagnosis not present

## 2021-12-27 DIAGNOSIS — N390 Urinary tract infection, site not specified: Secondary | ICD-10-CM | POA: Diagnosis not present

## 2021-12-27 DIAGNOSIS — M545 Low back pain, unspecified: Secondary | ICD-10-CM | POA: Diagnosis not present

## 2021-12-27 DIAGNOSIS — R310 Gross hematuria: Secondary | ICD-10-CM | POA: Diagnosis not present

## 2021-12-28 LAB — URINE CULTURE
MICRO NUMBER:: 13787763
SPECIMEN QUALITY:: ADEQUATE

## 2022-01-10 DIAGNOSIS — N390 Urinary tract infection, site not specified: Secondary | ICD-10-CM | POA: Diagnosis not present
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
## 2022-01-15 ENCOUNTER — Ambulatory Visit (INDEPENDENT_AMBULATORY_CARE_PROVIDER_SITE_OTHER): Payer: Medicare Other | Admitting: Family Medicine

## 2022-01-15 ENCOUNTER — Other Ambulatory Visit (HOSPITAL_BASED_OUTPATIENT_CLINIC_OR_DEPARTMENT_OTHER): Payer: Self-pay

## 2022-01-15 ENCOUNTER — Ambulatory Visit (HOSPITAL_BASED_OUTPATIENT_CLINIC_OR_DEPARTMENT_OTHER)
Admission: RE | Admit: 2022-01-15 | Discharge: 2022-01-15 | Disposition: A | Discharge status: Home / Self Care | Payer: Medicare Other | Source: Ambulatory Visit | Attending: Family Medicine | Admitting: Family Medicine

## 2022-01-15 ENCOUNTER — Encounter: Payer: Self-pay | Admitting: Family Medicine

## 2022-01-15 VITALS — BP 98/58 | HR 67 | Temp 98.3°F | Ht 71.0 in | Wt 152.6 lb

## 2022-01-15 DIAGNOSIS — J439 Emphysema, unspecified: Secondary | ICD-10-CM | POA: Diagnosis not present

## 2022-01-15 DIAGNOSIS — R052 Subacute cough: Secondary | ICD-10-CM | POA: Diagnosis not present

## 2022-01-15 DIAGNOSIS — R911 Solitary pulmonary nodule: Secondary | ICD-10-CM

## 2022-01-15 DIAGNOSIS — K581 Irritable bowel syndrome with constipation: Secondary | ICD-10-CM | POA: Diagnosis not present

## 2022-01-15 DIAGNOSIS — M542 Cervicalgia: Secondary | ICD-10-CM

## 2022-01-15 MED ORDER — AZITHROMYCIN 250 MG PO TABS
ORAL_TABLET | ORAL | 0 refills | Status: AC
  Filled 2022-01-15: qty 6, 5d supply, fill #0

## 2022-01-15 MED ORDER — IPRATROPIUM BROMIDE 0.06 % NA SOLN
2.0000 | Freq: Four times a day (QID) | NASAL | 12 refills | Status: DC
  Filled 2022-01-15: qty 15, 11d supply, fill #0
  Filled 2022-02-11: qty 15, 11d supply, fill #1

## 2022-01-15 MED ORDER — LINACLOTIDE 72 MCG PO CAPS
72.0000 ug | ORAL_CAPSULE | Freq: Every day | ORAL | 4 refills | Status: DC
  Filled 2022-01-15: qty 30, 30d supply, fill #0

## 2022-01-15 NOTE — Patient Instructions (Signed)
Good to see you today!  -for cough- chest x-ray, azithromycin antibiotic and atrovent nasal Repeat chest CT in ONE YEAR to follow-up nodule Linzess for constipation as needed  Plain x-rays of neck today- will proceed to MRI asap

## 2022-01-20 ENCOUNTER — Encounter (HOSPITAL_BASED_OUTPATIENT_CLINIC_OR_DEPARTMENT_OTHER): Payer: Self-pay

## 2022-01-20 ENCOUNTER — Ambulatory Visit (HOSPITAL_BASED_OUTPATIENT_CLINIC_OR_DEPARTMENT_OTHER)
Admission: RE | Admit: 2022-01-20 | Discharge: 2022-01-20 | Disposition: A | Discharge status: Home / Self Care | Payer: Medicare Other | Source: Ambulatory Visit | Attending: Family Medicine | Admitting: Family Medicine

## 2022-01-20 DIAGNOSIS — J8410 Pulmonary fibrosis, unspecified: Secondary | ICD-10-CM | POA: Diagnosis not present

## 2022-01-20 DIAGNOSIS — R911 Solitary pulmonary nodule: Secondary | ICD-10-CM | POA: Insufficient documentation

## 2022-01-20 MED ORDER — IOHEXOL 300 MG/ML  SOLN
100.0000 mL | Freq: Once | INTRAMUSCULAR | Status: AC | PRN
  Administered 2022-01-20: 75 mL via INTRAVENOUS

## 2022-01-21 ENCOUNTER — Telehealth: Payer: Self-pay | Admitting: Family Medicine

## 2022-01-21 ENCOUNTER — Encounter: Payer: Self-pay | Admitting: Family Medicine

## 2022-01-21 DIAGNOSIS — J189 Pneumonia, unspecified organism: Secondary | ICD-10-CM

## 2022-01-21 NOTE — Telephone Encounter (Signed)
I called patient earlier today and left message on machine that I am working on getting in touch with infectious disease for their advice.  I did speak with the ID physician on-call, they recommend an infectious disease referral but no other particular treatment right now  We will place referral and send a MyChart message to patient with this update Will ask her to keep me abreast of any changes or worsening of her condition

## 2022-01-21 NOTE — Telephone Encounter (Signed)
Patient called to advise that she got her CT results from yesterday and she does have an infection in lungs. She finished the antibiotic over the weekend but the cough and tightness under her esophagus is coming back. Patient is unsure of what to do next. She is on her way out of town and will be back late Friday afternoon. Please call to advise what she should do. [phone]

## 2022-01-21 NOTE — Telephone Encounter (Signed)
Please advise 

## 2022-01-27 DIAGNOSIS — N952 Postmenopausal atrophic vaginitis: Secondary | ICD-10-CM | POA: Diagnosis not present

## 2022-01-27 DIAGNOSIS — N8111 Cystocele, midline: Secondary | ICD-10-CM | POA: Diagnosis not present

## 2022-01-27 DIAGNOSIS — N390 Urinary tract infection, site not specified: Secondary | ICD-10-CM | POA: Diagnosis not present

## 2022-01-28 ENCOUNTER — Ambulatory Visit (INDEPENDENT_AMBULATORY_CARE_PROVIDER_SITE_OTHER): Payer: Medicare Other | Admitting: Internal Medicine

## 2022-01-28 ENCOUNTER — Encounter: Payer: Self-pay | Admitting: Internal Medicine

## 2022-01-28 ENCOUNTER — Other Ambulatory Visit: Payer: Self-pay

## 2022-01-28 VITALS — BP 95/62 | HR 62 | Temp 97.8°F | Resp 16 | Ht 72.0 in | Wt 153.2 lb

## 2022-01-28 DIAGNOSIS — R053 Chronic cough: Secondary | ICD-10-CM | POA: Diagnosis not present

## 2022-01-28 DIAGNOSIS — R9389 Abnormal findings on diagnostic imaging of other specified body structures: Secondary | ICD-10-CM

## 2022-01-28 DIAGNOSIS — R0982 Postnasal drip: Secondary | ICD-10-CM

## 2022-01-28 DIAGNOSIS — M069 Rheumatoid arthritis, unspecified: Secondary | ICD-10-CM | POA: Diagnosis not present

## 2022-01-28 DIAGNOSIS — K219 Gastro-esophageal reflux disease without esophagitis: Secondary | ICD-10-CM

## 2022-01-28 NOTE — Patient Instructions (Signed)
Your chest ct finding could represent a chronic bacterial infection called non-tuberculous mycobacterial infection  However, you have a lot of confounding medical issues that could cause chronic cough and the ct findings as well (Rheumatoid arthritis and reflux both can potentially do this)  Will need to have pulmonology's input, as diagnosis of this might require endoscopy of the lung and tissue biopsy  With regard to the chronic bacterial infection that we spoke about, the diagnosis is very strict: 1) requires persistence evidence of bacteria in your sputum (this bacteria is in environment and sometimes can present transiently only in the lung -- so if we keeps seeing it it might raise our suspicion) 2) imaging suggestion of infection  3) symptoms of chronic infection (weight loss, fever, nightsweat, fatigue, malaise, productive cough, short of breath) 4) ruling out other alternative diagnosis for the lung finding   Often we follow you for several months asking about history of symptoms each time and look at repeat sputum and chest imaging before we can make the diagnosis.   So today: Pulmonology referal Sputum culture (3 sets are ordered -- you can try to do them all in the morning, before brushing your teeth or gargling with any water) Blood tests to look for other potential fungal infection that can also looks like this

## 2022-01-28 NOTE — Progress Notes (Signed)
[facility]  Reason for Consult:chronic cough and abnormal chest ct Referring Provider: Lamar Blinks    Patient Active Problem List   Diagnosis Date Noted   Dyssynergic defecation    Squamous cell carcinoma in situ (SCCIS) of skin 05/24/2018   External hemorrhoid 12/09/2016   Globus sensation 12/09/2016   Gas pain 12/09/2016   Abdominal pain, epigastric 12/09/2016   Chronic insomnia 12/04/2016   Essential tremor 12/04/2016   Fibromyalgia 11/25/2016   Lumbar stenosis 11/25/2016   Paresthesia 05/25/2015   Tremor 05/25/2015   IBS (irritable bowel syndrome) 02/07/2015   Recurrent UTI 02/07/2015   Constipation due to outlet dysfunction 12/21/2013   Incontinence of feces 12/16/2012   Chest pain 06/24/2011   Anxiety 07/11/2008   GERD 07/05/2008   GASTRITIS 07/05/2008   HIATAL HERNIA 07/05/2008   GALLSTONES 07/05/2008      HPI: Angela Ray is a 71 y.o. female history of RA untreated, dry eyes/mouth, fibromyalgia, gerd, postnasal gtt, referred by pcp here for concern of ntm lung disease in setting abnormal chest ct  Chart reviewed   Patient had a renal ct scan done recently for w/u kidney stone which showed pulm nodule. She subsquently has chest ct done that showed extensive nodular process  She has developed 6 months persistent stable mostly dry cough at times with hoarse voice. No hemoptysis. At times noticed green brown sputum. No b sx. No dyspnea or chest pain  She reports chronic post-nasal gtt and having gerd as well. She uses ipratropium without help for the nasal gtt  She has dry eyes dry mouths and also dx of RA not treated. She previously was given some injection, along with prednisone, and methotrexate for RA. She had stopped seeing rheum for years as she doesn't get along well with her previous docx. She complains of joint stiffness and pain in ankles/wrists  No spine pain, rash, dysphagia, n/v/diarrhea.  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 No chemical exposure Born/raised in [place]; has been in [place]; air travel stops by [place] No tb risk factor No smoking    Review of Systems: ROS All other ROS negative     Past Medical History:  Diagnosis Date   Anxiety    Barrett's esophagus    in the past   Colon polyp    Depression    Dry eye syndrome of both eyes    Dupuytren's contracture of both hands 10/2016   sees Dr.Gramig--[facility]   Essential tremor 2016   legs, arm,lower jaw   Female bladder prolapse    Fibroid    Fibromyalgia    GERD (gastroesophageal reflux disease)    Heat exhaustion    Herniated disc, cervical    C3   Hiatal hernia    History of cardiovascular stress test    Lexiscan Myoview 7/16:  EF 65%, inferolateral defect (prob artifactual), no ischemia   History of recurrent UTIs    Hyperlipidemia    IBS (irritable bowel syndrome)    constipation   Leg cramps    Low back pain    Lumbar herniated disc    L4,L5   Osteoarthritis    Osteopenia    Osteoporosis    Osteopenia per pt   Rheumatoid arthritis, adult ([place]) 2016   both feet   Skin cancer    Spinal stenosis    lumbar  Tremors of nervous system    Vertigo     Social History   Tobacco Use   Smoking status: Never   Smokeless tobacco: Never  Vaping Use   Vaping Use: Never used  Substance Use Topics   Alcohol use: No    Alcohol/week: 0.0 standard drinks of alcohol    Comment: occ glass of wine   Drug use: No    Family History  Problem Relation Age of Onset   Coronary artery disease Father 56       CABG 2002, followd by Dr, Ron Parker   Prostate cancer Father    Bladder Cancer Father    Skin cancer Father    Diabetes Father 58       DM type 2   Atrial fibrillation Mother    Multiple myeloma Mother    Skin cancer Mother    Coronary artery disease Mother    Irritable bowel syndrome Mother    Diabetes Maternal  Grandmother    Hypertension Maternal Grandmother    Diabetes Maternal Grandfather    Hypertension Maternal Grandfather    Osteoarthritis Paternal Grandmother    Cancer Maternal Aunt        bone cancer   Colon cancer Neg Hx    Esophageal cancer Neg Hx    Stomach cancer Neg Hx    Rectal cancer Neg Hx     Allergies  Allergen Reactions   Aciphex [Rabeprazole Sodium] Other (See Comments)    REACTION: "FELT LIKE INDIGESTION"   Bupropion Hcl Other (See Comments)    REACTION: "DIDN'T MAKE ME FEEL RIGHT IN THE HEAD"   Cephalexin Nausea Only and Other (See Comments)    REACTION: "GAVE YEAST INFECTION"   Codeine Other (See Comments)    REACTION: "SEVERE NAUSEA, HEART PALPITATION, PASSING OUT, HEART ATTACK LIKE SYMPTOMS"   Esomeprazole Magnesium Other (See Comments)    Nexium=REACTION: " CAUSE TASTE BUDS TO FALL OUT"   Sertraline Hcl Other (See Comments)    REACTION: "DIDN'T MAKE ME FEEL RIGHT IN THE HEAD"   Linzess [Linaclotide] Diarrhea   Prednisone Other (See Comments)    Suicidal thoughts   Doxycycline Hyclate Nausea Only    OBJECTIVE: Vitals:   01/28/22 0836  BP: 95/62  Pulse: 62  Resp: 16  Temp: 97.8 F (36.6 C)  TempSrc: Oral  SpO2: 98%  Weight: 153 lb 3.2 oz (69.5 kg)  Height: 6' (1.829 m)   Body mass index is 20.78 kg/m.   Physical Exam General/constitutional: no distress, pleasant HEENT: Normocephalic, PER, Conj Clear, EOMI, Oropharynx clear Neck supple CV: rrr no mrg Lungs: clear to auscultation, normal respiratory effort Abd: Soft, Nontender Ext: no edema Skin: No Rash Neuro: nonfocal MSK: no peripheral joint swelling/tenderness/warmth; back spines nontender   Lab: Lab Results  Component Value Date   WBC 11.6 (H) 12/25/2021   HGB 13.7 12/25/2021   HCT 41.2 12/25/2021   MCV 95.4 12/25/2021   PLT 199.0 46/50/3546   Last metabolic panel Lab Results  Component Value Date   GLUCOSE 106 (H) 12/25/2021   NA 139 12/25/2021   K 3.9 12/25/2021   CL  96 12/25/2021   CO2 31 12/25/2021   BUN 11 12/25/2021   CREATININE 0.76 12/25/2021   GFRNONAA >60 10/23/2018   CALCIUM 9.2 12/25/2021   PROT 7.0 12/25/2021   ALBUMIN 4.5 12/25/2021   LABGLOB 2.3 03/06/2017   AGRATIO 2.0 03/06/2017   BILITOT 0.6 12/25/2021   ALKPHOS 38 (L) 12/25/2021   AST 24 12/25/2021  ALT 16 12/25/2021   ANIONGAP 10 10/23/2018    Microbiology:  Serology:  Imaging: Reviewed  9/11 chest ct with contrast Multiple cluster of irregular nodules are noted bilaterally in the lungs, with the largest being present in the right upper lobe. These findings most likely represent atypical infection such as mycobacterium. Follow-up unenhanced chest CT in 2-3 weeks is recommended to ensure resolution or stability and rule out neoplasm.   Large calcified granuloma seen in right lower lobe.    Assessment/plan: Problem List Items Addressed This Visit       Digestive   GERD   Other Visit Diagnoses     Chronic cough    -  Primary   Relevant Orders   Cryptococcal Antigen   Coccidioides Antibodies   Histoplasma Antigen, Urine   MVista Blastomyces Qnt. Ag (Urine)   QuantiFERON-TB Gold Plus   Abnormal chest CT       Relevant Orders   Cryptococcal Antigen   Coccidioides Antibodies   Histoplasma Antigen, Urine   MVista Blastomyces Qnt. Ag (Urine)   QuantiFERON-TB Gold Plus   Post-nasal drip       Rheumatoid arthritis, involving unspecified site, unspecified whether rheumatoid factor present ([place])             Reviewed ct and symptoms with her. Discuss a potential dx is chronic fungal/ntm infection. I do not suspect tb  For ntm this doesn't look classic lady's windemere syndrome to me in terms of anatomy. And symptomatically it is not impressive for ntm lung disease either  She has multiple confounders to consider r/o such as connective tissue related lung nodules/ILD and also gerd associated pulm parenchymal changes  Discuss with her the low success rate  of treatment for ntm lung, high relapse, high abx toxicity so requiring a firm dx before treatment considered. Explain the process might take a few months with more testings and pulmonology evaluation as well    -refer to pulm -sputum afb x3 (avoid gargling with water/brusing prior to getting sample); fungal serology; quantiferon gold -will reevaluate in 3 months with plan to repeat chest ct -advise her to see rheumatology again due to systemic nature of RA and need for potential immunosuppressants  I have spent a total of 65 minutes of face-to-face and non-face-to-face time, excluding clinical staff time, preparing to see patient, ordering tests and/or medications, and provide counseling the patient         Follow-up: No follow-ups on file.  Jabier Mutton, [facility] 01/28/2022, 9:02 AM

## 2022-01-31 ENCOUNTER — Other Ambulatory Visit (HOSPITAL_BASED_OUTPATIENT_CLINIC_OR_DEPARTMENT_OTHER): Payer: Self-pay | Admitting: Family Medicine

## 2022-01-31 DIAGNOSIS — M542 Cervicalgia: Secondary | ICD-10-CM

## 2022-02-01 ENCOUNTER — Ambulatory Visit (HOSPITAL_BASED_OUTPATIENT_CLINIC_OR_DEPARTMENT_OTHER)
Admission: RE | Admit: 2022-02-01 | Discharge: 2022-02-01 | Disposition: A | Discharge status: Home / Self Care | Payer: Medicare Other | Source: Ambulatory Visit | Attending: Family Medicine | Admitting: Family Medicine

## 2022-02-01 DIAGNOSIS — M542 Cervicalgia: Secondary | ICD-10-CM

## 2022-02-03 ENCOUNTER — Other Ambulatory Visit: Payer: Self-pay

## 2022-02-03 ENCOUNTER — Other Ambulatory Visit: Payer: Medicare Other

## 2022-02-03 DIAGNOSIS — R053 Chronic cough: Secondary | ICD-10-CM

## 2022-02-03 DIAGNOSIS — R9389 Abnormal findings on diagnostic imaging of other specified body structures: Secondary | ICD-10-CM

## 2022-02-04 ENCOUNTER — Other Ambulatory Visit: Payer: Self-pay

## 2022-02-04 ENCOUNTER — Other Ambulatory Visit: Payer: Medicare Other

## 2022-02-04 DIAGNOSIS — R053 Chronic cough: Secondary | ICD-10-CM | POA: Diagnosis not present

## 2022-02-04 DIAGNOSIS — R9389 Abnormal findings on diagnostic imaging of other specified body structures: Secondary | ICD-10-CM | POA: Diagnosis not present

## 2022-02-04 LAB — CRYPTOCOCCAL AG, LTX SCR RFLX TITER
Cryptococcal Ag Screen: NOT DETECTED
MICRO NUMBER:: 13940259
SPECIMEN QUALITY:: ADEQUATE

## 2022-02-04 LAB — MVISTA BLASTOMYCES QNT AG, URINE
Interpretation: NEGATIVE
Result (ng/ml): NOT DETECTED ng/mL

## 2022-02-04 LAB — QUANTIFERON-TB GOLD PLUS
Mitogen-NIL: >10 IU/mL
NIL: 0.02 IU/mL
QuantiFERON-TB Gold Plus: NEGATIVE
TB1-NIL: <0 IU/mL
TB2-NIL: 0.01 IU/mL

## 2022-02-04 LAB — HISTOPLASMA ANTIGEN, URINE: Histoplasma Antigen, urine: <0.2 ng/mL

## 2022-02-04 LAB — COCCIDIOIDES ANTIBODIES: COCCIDIOIDES AB, CF, SERUM: <1:2 {titer}

## 2022-02-05 ENCOUNTER — Other Ambulatory Visit: Payer: Medicare Other

## 2022-02-05 ENCOUNTER — Other Ambulatory Visit: Payer: Self-pay

## 2022-02-05 ENCOUNTER — Encounter: Payer: Self-pay | Admitting: Family Medicine

## 2022-02-05 DIAGNOSIS — R9389 Abnormal findings on diagnostic imaging of other specified body structures: Secondary | ICD-10-CM | POA: Diagnosis not present

## 2022-02-05 DIAGNOSIS — R053 Chronic cough: Secondary | ICD-10-CM

## 2022-02-11 ENCOUNTER — Other Ambulatory Visit (HOSPITAL_BASED_OUTPATIENT_CLINIC_OR_DEPARTMENT_OTHER): Payer: Self-pay

## 2022-02-11 ENCOUNTER — Encounter: Payer: Self-pay | Admitting: Pulmonary Disease

## 2022-02-11 ENCOUNTER — Ambulatory Visit (INDEPENDENT_AMBULATORY_CARE_PROVIDER_SITE_OTHER): Payer: Medicare Other | Admitting: Pulmonary Disease

## 2022-02-11 ENCOUNTER — Other Ambulatory Visit: Payer: Self-pay | Admitting: Family Medicine

## 2022-02-11 VITALS — BP 100/70 | HR 58 | Ht 71.0 in | Wt 152.4 lb

## 2022-02-11 DIAGNOSIS — R9389 Abnormal findings on diagnostic imaging of other specified body structures: Secondary | ICD-10-CM

## 2022-02-11 MED ORDER — METRONIDAZOLE 0.75 % EX CREA
TOPICAL_CREAM | Freq: Every day | CUTANEOUS | 1 refills | Status: AC | PRN
  Filled 2022-02-11: qty 45, 30d supply, fill #0

## 2022-02-11 MED ORDER — IPRATROPIUM BROMIDE 0.06 % NA SOLN: 2.0000 | Freq: Four times a day (QID) | NASAL | 5 refills | Status: DC

## 2022-02-11 NOTE — Progress Notes (Signed)
Subjective:   PATIENT ID: Angela Ray GENDER: female DOB: 12-18-1950, MRN: [id number]  Chief Complaint  Patient presents with   Consult    cough    Reason for Visit: New consult for abnormal CT, cough  [name] is a 71 year old female never smoker with RA not currently on immunosuppressants, fibromyalgia, tremors, anxiety, osteoarthritis, IBS with chronic constipation, hx recurrent UTI, hx skin cancer who presents as a new consult for abnormal CT, cough. Presents with husband who provides additional history.  She reports longstanding nonproductive cough for 8 months. Thought initially related to gardening. Sometimes productive but mostly chest congestion. Occurs at night. Denies wheezing. Has chronic nasal congestion. Tried atrovent nasal spray with improvement. At this point she reports her cough has resolved by >90%. Triggered by strong smells. Previously she noticed the car smelled of mildew. Now has a new car and symptoms has resolved. Changes filters monthly in there home. Has HEPA filter in home.  She was seen by PCP for kidney stones and recent E.coli UTI. Had renal scan completed which incidentally demonstrated 5 mm subpleural nodule in right lower lobe. Dedicated CT chest demonstrate multiple cluster of irregular nodules in the right upper lobe. Treated with azithromycin. Referred to Infectious Disease and had sputum culture collected. Fungal work-up also ordered and overall negative.  Of note, has hx of RA and previously on methotrexate, steroids, humira, enbrel in the past. >15 years since being on immunosuppressants.After switching to Dr. Lincoln Maxin she stopped meds due to side effects including tremors and nerv damage.  Social History: Currently living in facility Has dog. Sleeps in bed with them. Gets wiped down daily.  I have personally reviewed patient's past medical/family/social history, allergies, current medications.  Past Medical History:  Diagnosis  Date   Anxiety    Barrett's esophagus    in the past   Colon polyp    Depression    Dry eye syndrome of both eyes    Dupuytren's contracture of both hands 10/2016   sees Dr.Gramig--[facility]   Essential tremor 2016   legs, arm,lower jaw   Female bladder prolapse    Fibroid    Fibromyalgia    GERD (gastroesophageal reflux disease)    Heat exhaustion    Herniated disc, cervical    C3   Hiatal hernia    History of cardiovascular stress test    Lexiscan Myoview 7/16:  EF 65%, inferolateral defect (prob artifactual), no ischemia   History of recurrent UTIs    Hyperlipidemia    IBS (irritable bowel syndrome)    constipation   Leg cramps    Low back pain    Lumbar herniated disc    L4,L5   Osteoarthritis    Osteopenia    Osteoporosis    Osteopenia per pt   Rheumatoid arthritis, adult ([place]) 2016   both feet   Skin cancer    Spinal stenosis    lumbar   Tremors of nervous system    Vertigo      Family History  Problem Relation Age of Onset   Coronary artery disease Father 49       CABG 2002, followd by Dr, Ron Parker   Prostate cancer Father    Bladder Cancer Father    Skin cancer Father    Diabetes Father 30       DM type 2   Atrial fibrillation Mother    Multiple myeloma Mother    Skin cancer Mother  Coronary artery disease Mother    Irritable bowel syndrome Mother    Diabetes Maternal Grandmother    Hypertension Maternal Grandmother    Diabetes Maternal Grandfather    Hypertension Maternal Grandfather    Osteoarthritis Paternal Grandmother    Cancer Maternal Aunt        bone cancer   Colon cancer Neg Hx    Esophageal cancer Neg Hx    Stomach cancer Neg Hx    Rectal cancer Neg Hx      Social History   Occupational History   Occupation: Retired    Fish farm manager: RETIRED  Tobacco Use   Smoking status: Never   Smokeless tobacco: Never  Vaping Use   Vaping Use: Never used  Substance and Sexual Activity   Alcohol use: No    Alcohol/week: 0.0 standard  drinks of alcohol    Comment: occ glass of wine   Drug use: No   Sexual activity: Not Currently    Partners: Male    Birth control/protection: Surgical    Comment: TAH still has ovaries    Allergies  Allergen Reactions   Aciphex [Rabeprazole Sodium] Other (See Comments)    REACTION: "FELT LIKE INDIGESTION"   Bupropion Hcl Other (See Comments)    REACTION: "DIDN'T MAKE ME FEEL RIGHT IN THE HEAD"   Cephalexin Nausea Only and Other (See Comments)    REACTION: "GAVE YEAST INFECTION"   Codeine Other (See Comments)    REACTION: "SEVERE NAUSEA, HEART PALPITATION, PASSING OUT, HEART ATTACK LIKE SYMPTOMS"   Esomeprazole Magnesium Other (See Comments)    Nexium=REACTION: " CAUSE TASTE BUDS TO FALL OUT"   Sertraline Hcl Other (See Comments)    REACTION: "DIDN'T MAKE ME FEEL RIGHT IN THE HEAD"   Linzess [Linaclotide] Diarrhea   Prednisone Other (See Comments)    Suicidal thoughts   Doxycycline Hyclate Nausea Only     Outpatient Medications Prior to Visit  Medication Sig Dispense Refill   Azelaic Acid 15 % cream Apply 1 application topically every morning.     busPIRone (BUSPAR) 30 MG tablet Take 1 tablet by mouth 2 times a day. 180 tablet 4   cetirizine (ZYRTEC) 10 MG tablet Take 10 mg by mouth daily as needed for allergies.      clonazePAM (KLONOPIN) 1 MG tablet Take 1 tablet by mouth 3 (three) times daily.     Cyanocobalamin (B-12) 2500 MCG TABS Take by mouth daily.     cyclobenzaprine (FLEXERIL) 10 MG tablet TAKE 1 TABLET BY MOUTH TWICE A DAY AS NEEDED FOR MUSCLE SPASMS 30 tablet 2   ESTRACE VAGINAL 0.1 MG/GM vaginal cream Apply 1 application topically daily. Apply small amount externally to vaginal area daily     fluticasone (CUTIVATE) 0.05 % cream Apply 1 application topically 2 (two) times daily.     fluticasone (FLONASE) 50 MCG/ACT nasal spray Place 2 sprays into both nostrils at bedtime as needed for allergies. 48 g 4   gabapentin (NEURONTIN) 300 MG capsule as needed.      hydrocortisone (ANUSOL-HC) 2.5 % rectal cream Place 1 application rectally 2 (two) times daily. 30 g 1   ibuprofen (ADVIL,MOTRIN) 600 MG tablet TAKE 1 TABLET (600 MG TOTAL) BY MOUTH EVERY 8 (EIGHT) HOURS AS NEEDED (PAIN). 40 tablet 1   lidocaine (XYLOCAINE) 5 % ointment Apply daily as needed     Lifitegrast (XIIDRA) 5 % SOLN Apply 1 drop to eye daily.     linaclotide (LINZESS) 72 MCG capsule Take 1 capsule (72 mcg  total) by mouth daily before breakfast. 30 capsule 4   metroNIDAZOLE (METROCREAM) 0.75 % cream Apply topically daily as needed. 45 g 1   Multiple Vitamins-Minerals (HAIR/SKIN/NAILS/BIOTIN PO) Take by mouth daily.     OVER THE COUNTER MEDICATION Take 1 capsule by mouth 3 (three) times daily. Hydro eye     PARoxetine (PAXIL) 20 MG tablet Take 20 mg by mouth 2 (two) times daily.  4   propranolol (INDERAL) 20 MG tablet TAKE 1 TABLET BY MOUTH TWICE A DAY 180 tablet 1   traMADol (ULTRAM-ER) 100 MG 24 hr tablet Take 100 mg by mouth daily.     tretinoin (RETIN-A) 0.025 % cream Apply 1 application topically at bedtime.     trimethoprim (TRIMPEX) 100 MG tablet Take 1 tablet by mouth daily.     ipratropium (ATROVENT) 0.06 % nasal spray Place 2 sprays into both nostrils 4 (four) times daily. Use as needed for congestion and drip 15 mL 12   ipratropium (ATROVENT) 0.03 % nasal spray Place into both nostrils.     No facility-administered medications prior to visit.    Review of Systems  Constitutional:  Negative for chills, diaphoresis, fever, malaise/fatigue and weight loss.  HENT:  Negative for congestion.   Respiratory:  Negative for cough, hemoptysis, sputum production, shortness of breath and wheezing.   Cardiovascular:  Negative for chest pain, palpitations and leg swelling.  Gastrointestinal:  Positive for heartburn.  Musculoskeletal:  Positive for joint pain.  Psychiatric/Behavioral:  The patient is nervous/anxious.      Objective:   Vitals:   02/11/22 1517  BP: 100/70  Pulse:  (!) 58  SpO2: 98%  Weight: 152 lb 6.4 oz (69.1 kg)  Height: _0  (1.803 m)   SpO2: 98 % O2 Device: None (Room air)  Physical Exam: General: Well-appearing, no acute distress HENT: [place], AT Eyes: EOMI, no scleral icterus Respiratory: Clear to auscultation bilaterally.  No crackles, wheezing or rales Cardiovascular: RRR, -M/R/G, no JVD Extremities:-Edema,-tenderness Neuro: AAO x4, CNII-XII grossly intact Psych: Normal mood, normal affect  Data Reviewed:  Imaging: CT Renal Stone Study 12/25/21 - Remarkable for RLL 5 mm nodule. CT Chest 01/20/22 - Nodular clusters bilaterally with largest cluster in the right upper lobe. Also present in RML and LLL and lingula  PFT: None on file  Labs: Coccidioides ab - not detected Histoplasma ab - neg Cryptococcal ag - neg Quantiferon - neg     Assessment & Plan:   Discussion: 71 year old female never smoker with RA not currently on immunosuppressants, fibromyalgia, tremors, anxiety, osteoarthritis, IBS with chronic constipation, hx recurrent UTI, hx skin cancer who presents as a new consult for abnormal CT, cough.  Cough has nearly resolved. Discussed utility in PFTs however likely low-yield and would not start bronchodilators if symptoms are improving without intervention.  We reviewed history and available imaging and labs together. Chest imaging is atypical for MAC or TB. Fungal is a possibility. Quantiferon and fungal studies negative which is reassuring. With her history of rheumatoid arthritis, her lung findings could represent RA nodules however in the absence of cavitation and symptoms, would not warrant treatment at this time. If lesions progress and demonstration complications such as cavitation, rupture, pleural effusion and/or recurrent infection, may need to consider tissue biopsy via bronchoscopy. Although she reports prior intolerance to biologics for her RA treatment in the past, we may need to readdress this if future CT scan  demonstrates progression.  #Atypical CT Chest --ORDER CT Chest without contrast  the week of October 23rd. Schedule [facility] --Will call with results. If persistent/worsened will consider bronchoscopy --F/u AFB by ID    Health Maintenance Immunization History  Administered Date(s) Administered   Influenza, High Dose Seasonal PF 03/01/2018, 02/15/2019, 02/07/2020, 02/26/2021, 02/06/2022   Influenza,inj,Quad PF,6+ Mos 02/10/2017   Influenza-Unspecified 03/12/2016   Moderna Sars-Covid-2 Vaccination 05/10/2019, 06/07/2019, 06/18/2019   Pneumococcal Conjugate-13 04/19/2018   Pneumococcal-Unspecified 05/12/2014   Tdap 05/12/2014   Zoster Recombinat (Shingrix) 11/08/2019, 01/16/2020   Zoster, Live 05/12/2014   CT Lung Screen - never smoker  Orders Placed This Encounter  Procedures   CT Chest Wo Contrast    Standing Status:   Future    Standing Expiration Date:   02/12/2023    Scheduling Instructions:     Week of 03/03/2022    Order Specific Question:   Preferred imaging location?    Answer:   [facility]   Meds ordered this encounter  Medications   ipratropium (ATROVENT) 0.06 % nasal spray    Sig: Place 2 sprays into both nostrils 4 (four) times daily.    Dispense:  15 mL    Refill:  5    Return in about 3 months (around 05/14/2022).  I have spent a total time of 60-minutes on the day of the appointment reviewing prior documentation, coordinating care and discussing medical diagnosis and plan with the patient/family. Imaging, labs and tests included in this note have been reviewed and interpreted independently by me.  Armstrong, MD [facility] 02/11/2022 6:34 PM  Office Number [phone]

## 2022-02-11 NOTE — Patient Instructions (Addendum)
Atypical CT Chest --ORDER CT Chest without contrast the week of October 23rd. Schedule [facility] --Will call with results. If persistent/worsened will consider bronchoscopy --F/u AFB by ID  Follow-up with me in 3 months

## 2022-02-12 ENCOUNTER — Other Ambulatory Visit (HOSPITAL_BASED_OUTPATIENT_CLINIC_OR_DEPARTMENT_OTHER): Payer: Self-pay

## 2022-02-20 NOTE — Telephone Encounter (Signed)
Pt states she does not understand the imaging results and she would like someone to explain this to her in a way she could understand, and what the next steps are.

## 2022-02-27 ENCOUNTER — Other Ambulatory Visit: Payer: Self-pay | Admitting: Family Medicine

## 2022-02-27 DIAGNOSIS — G250 Essential tremor: Secondary | ICD-10-CM

## 2022-03-03 ENCOUNTER — Ambulatory Visit (HOSPITAL_BASED_OUTPATIENT_CLINIC_OR_DEPARTMENT_OTHER)
Admission: RE | Admit: 2022-03-03 | Discharge: 2022-03-03 | Disposition: A | Discharge status: Home / Self Care | Payer: Medicare Other | Source: Ambulatory Visit | Attending: Pulmonary Disease | Admitting: Pulmonary Disease

## 2022-03-03 DIAGNOSIS — R9389 Abnormal findings on diagnostic imaging of other specified body structures: Secondary | ICD-10-CM | POA: Insufficient documentation

## 2022-03-03 DIAGNOSIS — R918 Other nonspecific abnormal finding of lung field: Secondary | ICD-10-CM | POA: Diagnosis not present

## 2022-03-03 DIAGNOSIS — J479 Bronchiectasis, uncomplicated: Secondary | ICD-10-CM | POA: Diagnosis not present

## 2022-03-06 ENCOUNTER — Telehealth: Payer: Self-pay | Admitting: Pulmonary Disease

## 2022-03-06 DIAGNOSIS — R9389 Abnormal findings on diagnostic imaging of other specified body structures: Secondary | ICD-10-CM

## 2022-03-06 DIAGNOSIS — R079 Chest pain, unspecified: Secondary | ICD-10-CM

## 2022-03-06 DIAGNOSIS — M542 Cervicalgia: Secondary | ICD-10-CM

## 2022-03-06 NOTE — Telephone Encounter (Signed)
[facility] Telephone Encounter 03/05/22  I contacted patient to discuss recent CT results from 03/03/22. We reviewed findings including cylindrical bronchiectasis with mucoid impaction with mildly worsening RUL right upper lobe nodularity. Patient does have cough with chest congestion but does not feel symptoms have worsened or changed. After discussion regarding risks and benefits of bronchoscopy, she states she wishes to remain conservative with management.  Plan for repeat CT Chest in 6 months Schedule follow-up in 6 months with pulmonary  Per our conversation, I will also CC Dr. Lorelei Pont her PCP regarding findings of atherosclerosis and incidental cirrhosis  >Atherosclerosis: LDL elevated however patient unable to tolerated statin.   >Cirrhosis: May warrant further work-up. Prior LFTs normal >2 years. Will defer to PCP   Rodman Pickle, M.D. [facility] 03/06/2022 4:45 PM

## 2022-03-10 ENCOUNTER — Telehealth: Payer: Self-pay | Admitting: Family Medicine

## 2022-03-10 NOTE — Telephone Encounter (Signed)
See below

## 2022-03-10 NOTE — Telephone Encounter (Signed)
Called her back- reports she never got my notes regarding her MRI from 02/05/22.  We went over these today  IMPRESSION: 1. C5-6 ACDF with solid arthrodesis and no recurrent impingement. 2. Degeneration at C3-4, C4-5, and C5-6 as described. 3. Moderate foraminal stenosis on the left at C3-4. Mild spinal stenosis at C4-5.  Her cervical fusion surgery was done through a [facility] in [place] back in 2002- this surgeon is no longer in town  She was seen by Dr Ellene Route with [facility] back in 2020 and would like to see him for follow-up, referral placed  Also went over her CT report from pulmonology   IMPRESSION: 1. Bronchiectasis, peribronchovascular nodularity and mucoid impaction, worst in the upper lobes and right middle lobe, unchanged from 01/20/2022 and compatible with chronic mycobacterium avium complex. 2. Mild subpleural and slightly basilar predominant subpleural reticular densities and ground-glass, indicative of interstitial lung disease. If further evaluation is desired, high-resolution chest CT without contrast is recommended. 3. Cirrhosis. 4.  Aortic atherosclerosis (ICD10-I70.0).  Reassured that aortic atherosclerosis is not unexpected.  However we will go forward with a CT coronary for her Advised CT is not able to provide definitive diagnosis of cirrhosis.  Will get in touch with her GI for next steps

## 2022-03-10 NOTE — Telephone Encounter (Signed)
Pt would like pcp to go over mri on spine and chest x-ray. Stated pulmonologist went over chest results but wants ppc to go over it as well.

## 2022-03-11 NOTE — Telephone Encounter (Signed)
Patient's neurologist is Dr. Kristeen Miss at [facility], [phone]

## 2022-03-12 NOTE — Addendum Note (Signed)
Addended by: Darliss Ridgel on: 03/12/2022 08:39 AM   Modules accepted: Orders

## 2022-03-12 NOTE — Telephone Encounter (Signed)
CT chest ordered, 2morecall placed

## 2022-03-17 LAB — MYCOBACTERIA,CULT W/FLUOROCHROME SMEAR
MICRO NUMBER:: 13967237
MICRO NUMBER:: 13973137
SMEAR:: NONE SEEN
SMEAR:: NONE SEEN
SPECIMEN QUALITY:: ADEQUATE
SPECIMEN QUALITY:: ADEQUATE

## 2022-03-19 ENCOUNTER — Ambulatory Visit (HOSPITAL_BASED_OUTPATIENT_CLINIC_OR_DEPARTMENT_OTHER)
Admission: RE | Admit: 2022-03-19 | Discharge: 2022-03-19 | Disposition: A | Discharge status: Home / Self Care | Payer: Medicare Other | Source: Ambulatory Visit | Attending: Family Medicine | Admitting: Family Medicine

## 2022-03-19 DIAGNOSIS — R079 Chest pain, unspecified: Secondary | ICD-10-CM

## 2022-03-20 ENCOUNTER — Encounter: Payer: Self-pay | Admitting: Family Medicine

## 2022-03-20 ENCOUNTER — Ambulatory Visit (HOSPITAL_BASED_OUTPATIENT_CLINIC_OR_DEPARTMENT_OTHER)
Admission: RE | Admit: 2022-03-20 | Discharge: 2022-03-20 | Disposition: A | Discharge status: Home / Self Care | Payer: Medicare Other | Source: Ambulatory Visit | Attending: Family Medicine | Admitting: Family Medicine

## 2022-03-20 DIAGNOSIS — R079 Chest pain, unspecified: Secondary | ICD-10-CM | POA: Insufficient documentation

## 2022-03-22 LAB — MYCOBACTERIA,CULT W/FLUOROCHROME SMEAR
MICRO NUMBER:: 13979354
SMEAR:: NONE SEEN
SPECIMEN QUALITY:: ADEQUATE

## 2022-04-10 ENCOUNTER — Encounter: Payer: Self-pay | Admitting: Gastroenterology

## 2022-04-24 ENCOUNTER — Encounter: Payer: Self-pay | Admitting: Internal Medicine

## 2022-04-24 ENCOUNTER — Ambulatory Visit (INDEPENDENT_AMBULATORY_CARE_PROVIDER_SITE_OTHER): Payer: Medicare Other | Admitting: Internal Medicine

## 2022-04-24 ENCOUNTER — Other Ambulatory Visit: Payer: Self-pay

## 2022-04-24 VITALS — BP 105/70 | HR 58 | Temp 97.9°F | Wt 155.0 lb

## 2022-04-24 DIAGNOSIS — A310 Pulmonary mycobacterial infection: Secondary | ICD-10-CM

## 2022-04-24 NOTE — Progress Notes (Signed)
[facility]    Patient Active Problem List   Diagnosis Date Noted   Dyssynergic defecation    Squamous cell carcinoma in situ (SCCIS) of skin 05/24/2018   External hemorrhoid 12/09/2016   Globus sensation 12/09/2016   Gas pain 12/09/2016   Abdominal pain, epigastric 12/09/2016   Chronic insomnia 12/04/2016   Essential tremor 12/04/2016   Fibromyalgia 11/25/2016   Lumbar stenosis 11/25/2016   Paresthesia 05/25/2015   Tremor 05/25/2015   IBS (irritable bowel syndrome) 02/07/2015   Recurrent UTI 02/07/2015   Constipation due to outlet dysfunction 12/21/2013   Incontinence of feces 12/16/2012   Chest pain 06/24/2011   Anxiety 07/11/2008   GERD 07/05/2008   GASTRITIS 07/05/2008   HIATAL HERNIA 07/05/2008   GALLSTONES 07/05/2008      HPI: Angela Ray is a 71 y.o. female history of RA untreated, dry eyes/mouth, fibromyalgia, gerd, postnasal gtt, referred by pcp here for concern of ntm lung disease in setting abnormal chest ct  Chart reviewed   Patient had a renal ct scan done recently for w/u kidney stone which showed pulm nodule. She subsquently has chest ct done that showed extensive nodular process  She has developed 6 months persistent stable mostly dry cough at times with hoarse voice. No hemoptysis. At times noticed green brown sputum. No b sx. No dyspnea or chest pain  She reports chronic post-nasal gtt and having gerd as well. She uses ipratropium without help for the nasal gtt  She has dry eyes dry mouths and also dx of RA not treated. She previously was given some injection, along with prednisone, and methotrexate for RA. She had stopped seeing rheum for years as she doesn't get along well with her previous docx. She complains of joint stiffness and pain in ankles/wrists  No spine pain, rash, dysphagia, n/v/diarrhea.  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 She said she sold her old car where she smells musty odor and she thinks that might make her cough  She is singing in choir now and reengaging in strenuous exercise routine.  No f/c/nightsweat Good appetite No weight loss  Reviewed chest ct's with patient  Review of Systems: ROS All other ROS negative     Past Medical History:  Diagnosis Date   Anxiety    Barrett's esophagus    in the past   Colon polyp    Depression    Dry eye syndrome of both eyes    Dupuytren's contracture of both hands 10/2016   sees Dr.Gramig--[facility]   Essential tremor 2016   legs, arm,lower jaw   Female bladder prolapse    Fibroid    Fibromyalgia    GERD (gastroesophageal reflux disease)    Heat exhaustion    Herniated disc, cervical    C3   Hiatal hernia    History of cardiovascular stress test    Lexiscan Myoview 7/16:  EF 65%, inferolateral defect (prob artifactual), no ischemia   History of recurrent UTIs    Hyperlipidemia  IBS (irritable bowel syndrome)    constipation   Leg cramps    Low back pain    Lumbar herniated disc    L4,L5   Osteoarthritis    Osteopenia    Osteoporosis    Osteopenia per pt   Rheumatoid arthritis, adult ([place]) 2016   both feet   Skin cancer    Spinal stenosis    lumbar   Tremors of nervous system    Vertigo     Social History   Tobacco Use   Smoking status: Never   Smokeless tobacco: Never  Vaping Use   Vaping Use: Never used  Substance Use Topics   Alcohol use: No    Alcohol/week: 0.0 standard drinks of alcohol    Comment: occ glass of wine    Drug use: No    Family History  Problem Relation Age of Onset   Coronary artery disease Father 43       CABG 2002, followd by Dr, Ron Parker   Prostate cancer Father    Bladder Cancer Father    Skin cancer Father    Diabetes Father 76       DM type 2   Atrial fibrillation Mother    Multiple myeloma Mother    Skin cancer Mother    Coronary artery disease Mother    Irritable bowel syndrome Mother    Diabetes Maternal Grandmother    Hypertension Maternal Grandmother    Diabetes Maternal Grandfather    Hypertension Maternal Grandfather    Osteoarthritis Paternal Grandmother    Cancer Maternal Aunt        bone cancer   Colon cancer Neg Hx    Esophageal cancer Neg Hx    Stomach cancer Neg Hx    Rectal cancer Neg Hx     Allergies  Allergen Reactions   Aciphex [Rabeprazole Sodium] Other (See Comments)    REACTION: "FELT LIKE INDIGESTION"   Bupropion Hcl Other (See Comments)    REACTION: "DIDN'T MAKE ME FEEL RIGHT IN THE HEAD"   Cephalexin Nausea Only and Other (See Comments)    REACTION: "GAVE YEAST INFECTION"   Codeine Other (See Comments)    REACTION: "SEVERE NAUSEA, HEART PALPITATION, PASSING OUT, HEART ATTACK LIKE SYMPTOMS"   Esomeprazole Magnesium Other (See Comments)    Nexium=REACTION: " CAUSE TASTE BUDS TO FALL OUT"   Sertraline Hcl Other (See Comments)    REACTION: "DIDN'T MAKE ME FEEL RIGHT IN THE HEAD"   Linzess [Linaclotide] Diarrhea   Prednisone Other (See Comments)    Suicidal thoughts   Doxycycline Hyclate Nausea Only    OBJECTIVE: Vitals:   04/24/22 0918  BP: 105/70  Pulse: (!) 58  Temp: 97.9 F (36.6 C)  TempSrc: Oral  SpO2: 97%  Weight: 155 lb (70.3 kg)   Body mass index is 21.62 kg/m.   Physical Exam General/constitutional: no distress, pleasant HEENT: Normocephalic, PER, Conj Clear, EOMI, Oropharynx clear Neck supple CV: rrr no mrg Lungs: clear to auscultation, normal respiratory effort Abd: Soft, Nontender Ext: no edema Skin: No  Rash Neuro: nonfocal MSK: no peripheral joint swelling/tenderness/warmth; back spines nontender     Lab: Lab Results  Component Value Date   WBC 11.6 (H) 12/25/2021   HGB 13.7 12/25/2021   HCT 41.2 12/25/2021   MCV 95.4 12/25/2021   PLT 199.0 93/81/0175   Last metabolic panel Lab Results  Component Value Date   GLUCOSE 106 (H) 12/25/2021   NA 139 12/25/2021   K 3.9 12/25/2021   CL  96 12/25/2021   CO2 31 12/25/2021   BUN 11 12/25/2021   CREATININE 0.76 12/25/2021   GFRNONAA >60 10/23/2018   CALCIUM 9.2 12/25/2021   PROT 7.0 12/25/2021   ALBUMIN 4.5 12/25/2021   LABGLOB 2.3 03/06/2017   AGRATIO 2.0 03/06/2017   BILITOT 0.6 12/25/2021   ALKPHOS 38 (L) 12/25/2021   AST 24 12/25/2021   ALT 16 12/25/2021   ANIONGAP 10 10/23/2018    Microbiology:  Serology:  Imaging: Reviewed  9/11 chest ct with contrast Multiple cluster of irregular nodules are noted bilaterally in the lungs, with the largest being present in the right upper lobe. These findings most likely represent atypical infection such as mycobacterium. Follow-up unenhanced chest CT in 2-3 weeks is recommended to ensure resolution or stability and rule out neoplasm.   Large calcified granuloma seen in right lower lobe.   03/20/22 chest ct FINDINGS: No definite abnormality seen involving the visualized portions of the extracardiac vascular structures. Calcified right hilar lymph node is noted consistent with prior granulomatous disease. Visualized portion of upper abdomen is unremarkable. Large calcified granuloma is noted in superior segment of right lower lobe. Stable reticulonodular densities are noted in the right lung predominantly most consistent with chronic mycobacterial infection as noted on prior CT scan. Visualized skeleton is unremarkable.   IMPRESSION: Stable chronic lung findings as described on recently performed CT scan. No definite acute abnormality is noted in the  visualized extracardiac portions of the chest.   Assessment/plan: Problem List Items Addressed This Visit   None Visit Diagnoses     Atypical mycobacterial infection of lung ([place])    -  Primary        Reviewed ct and symptoms with her. Discuss a potential dx is chronic fungal/ntm infection. I do not suspect tb  For ntm this doesn't look classic lady's windemere syndrome to me in terms of anatomy. And symptomatically it is not impressive for ntm lung disease either  She has multiple confounders to consider r/o such as connective tissue related lung nodules/ILD and also gerd associated pulm parenchymal changes  Discuss with her the low success rate of treatment for ntm lung, high relapse, high abx toxicity so requiring a firm dx before treatment considered. Explain the process might take a few months with more testings and pulmonology evaluation as well    -refer to pulm -sputum afb x3 (avoid gargling with water/brusing prior to getting sample); fungal serology; quantiferon gold -will reevaluate in 3 months with plan to repeat chest ct -advise her to see rheumatology again due to systemic nature of RA and need for potential immunosuppressants  --------------- 12/14 assessment Sputum culture x3 all grew MAC from 9/25-9/27/2023; I wonder why there is no susceptibility testing At this time she feels even better than our first visit. She sold her car where she smells musty odor which she thinks contribute to cough. But now minimal coughing, singing in choir for at least 90 minutes at a time and reengaging in rigorous physical exercise without problems. Also chest ct repeat shows stability of reticular nodular changes  Will monitor her four months from now. And if symptoms/imaging continue to be stable we can space out visit.  I have spent a total of 35 minutes of face-to-face and non-face-to-face time, excluding clinical staff time, preparing to see patient, ordering tests and/or  medications, and provide counseling the patient     Follow-up: No follow-ups on file.  Jabier Mutton, [facility] 04/24/2022, 9:35 AM

## 2022-04-24 NOTE — Patient Instructions (Signed)
You do have mycobacteria avium infection in the lungs. These are very slow if they will progress. We don't often treat these unless strict criteria are met (severe lung damage, severe symptoms). If you feel well and the lungs don't look bad we don't treat but just follow symptoms and imaging every 2 or 3 times year  Treatment is with multiple medications and at least for 18 months and success is so low as well as relapse being high, and also very high medication side effects/toxicity rates. These are some of the reasons we don't treat  Check in with me mid-late spring 2024, will review your symptoms and see when we need to do chest ct again. If you are seeing pulmonology ask them how often they want to see you

## 2022-05-05 ENCOUNTER — Other Ambulatory Visit: Payer: Self-pay

## 2022-05-05 ENCOUNTER — Encounter (HOSPITAL_BASED_OUTPATIENT_CLINIC_OR_DEPARTMENT_OTHER): Payer: Self-pay | Admitting: Emergency Medicine

## 2022-05-05 ENCOUNTER — Emergency Department (HOSPITAL_BASED_OUTPATIENT_CLINIC_OR_DEPARTMENT_OTHER)
Admission: EM | Admit: 2022-05-05 | Discharge: 2022-05-05 | Disposition: A | Discharge status: Home / Self Care | Payer: Medicare Other | Attending: Emergency Medicine | Admitting: Emergency Medicine

## 2022-05-05 DIAGNOSIS — E860 Dehydration: Secondary | ICD-10-CM | POA: Diagnosis not present

## 2022-05-05 DIAGNOSIS — R070 Pain in throat: Secondary | ICD-10-CM | POA: Insufficient documentation

## 2022-05-05 DIAGNOSIS — R197 Diarrhea, unspecified: Secondary | ICD-10-CM | POA: Insufficient documentation

## 2022-05-05 DIAGNOSIS — R112 Nausea with vomiting, unspecified: Secondary | ICD-10-CM | POA: Insufficient documentation

## 2022-05-05 LAB — COMPREHENSIVE METABOLIC PANEL
ALT: 20 U/L (ref 0–44)
AST: 28 U/L (ref 15–41)
Albumin: 4.3 g/dL (ref 3.5–5.0)
Alkaline Phosphatase: 39 U/L (ref 38–126)
Anion gap: 9 (ref 5–15)
BUN: 16 mg/dL (ref 8–23)
CO2: 27 mmol/L (ref 22–32)
Calcium: 9 mg/dL (ref 8.9–10.3)
Chloride: 104 mmol/L (ref 98–111)
Creatinine, Ser: 0.68 mg/dL (ref 0.44–1.00)
GFR, Estimated: >60 mL/min (ref >60)
Glucose, Bld: 139 mg/dL — ABNORMAL HIGH (ref 70–99)
Potassium: 4.5 mmol/L (ref 3.5–5.1)
Sodium: 140 mmol/L (ref 135–145)
Total Bilirubin: 0.7 mg/dL (ref 0.3–1.2)
Total Protein: 7.4 g/dL (ref 6.5–8.1)

## 2022-05-05 LAB — CBC
HCT: 42.1 % (ref 36.0–46.0)
Hemoglobin: 14.5 g/dL (ref 12.0–15.0)
MCH: 32.2 pg (ref 26.0–34.0)
MCHC: 34.4 g/dL (ref 30.0–36.0)
MCV: 93.6 fL (ref 80.0–100.0)
Platelets: 172 10*3/uL (ref 150–400)
RBC: 4.5 MIL/uL (ref 3.87–5.11)
RDW: 12.1 % (ref 11.5–15.5)
WBC: 9.7 10*3/uL (ref 4.0–10.5)
nRBC: 0 % (ref 0.0–0.2)

## 2022-05-05 LAB — URINALYSIS, ROUTINE W REFLEX MICROSCOPIC
Bilirubin Urine: NEGATIVE
Glucose, UA: NEGATIVE mg/dL
Ketones, ur: NEGATIVE mg/dL
Leukocytes,Ua: NEGATIVE
Nitrite: NEGATIVE
Protein, ur: NEGATIVE mg/dL
Specific Gravity, Urine: 1.015 (ref 1.005–1.030)
pH: 6.5 (ref 5.0–8.0)

## 2022-05-05 LAB — URINALYSIS, MICROSCOPIC (REFLEX): Bacteria, UA: NONE SEEN

## 2022-05-05 LAB — LIPASE, BLOOD: Lipase: 54 U/L — ABNORMAL HIGH (ref 11–51)

## 2022-05-05 MED ORDER — ONDANSETRON 4 MG PO TBDP
4.0000 mg | ORAL_TABLET | Freq: Once | ORAL | Status: AC | PRN
  Administered 2022-05-05: 4 mg via ORAL
  Filled 2022-05-05: qty 1

## 2022-05-05 MED ORDER — ONDANSETRON HCL 4 MG PO TABS: 4.0000 mg | ORAL_TABLET | Freq: Three times a day (TID) | ORAL | 0 refills | Status: DC | PRN

## 2022-05-05 MED ORDER — PROMETHAZINE HCL 25 MG/ML IJ SOLN
INTRAMUSCULAR | Status: AC
  Filled 2022-05-05: qty 1

## 2022-05-05 MED ORDER — SODIUM CHLORIDE 0.9 % IV BOLUS
1000.0000 mL | Freq: Once | INTRAVENOUS | Status: AC
  Administered 2022-05-05: 1000 mL via INTRAVENOUS

## 2022-05-05 MED ORDER — ONDANSETRON HCL 4 MG/2ML IJ SOLN
4.0000 mg | Freq: Once | INTRAMUSCULAR | Status: AC
  Administered 2022-05-05: 4 mg via INTRAVENOUS
  Filled 2022-05-05: qty 2

## 2022-05-05 MED ORDER — PROMETHAZINE HCL 25 MG RE SUPP: 25.0000 mg | Freq: Four times a day (QID) | RECTAL | 0 refills | Status: DC | PRN

## 2022-05-05 MED ORDER — FAMOTIDINE IN NACL 20-0.9 MG/50ML-% IV SOLN
20.0000 mg | Freq: Once | INTRAVENOUS | Status: AC
  Administered 2022-05-05: 20 mg via INTRAVENOUS
  Filled 2022-05-05: qty 50

## 2022-05-05 MED ORDER — SODIUM CHLORIDE 0.9 % IV SOLN
12.5000 mg | Freq: Once | INTRAVENOUS | Status: AC
  Administered 2022-05-05: 12.5 mg via INTRAVENOUS
  Filled 2022-05-05: qty 0.5

## 2022-05-05 MED ORDER — KETOROLAC TROMETHAMINE 15 MG/ML IJ SOLN
15.0000 mg | Freq: Once | INTRAMUSCULAR | Status: AC
  Administered 2022-05-05: 15 mg via INTRAVENOUS
  Filled 2022-05-05: qty 1

## 2022-05-05 NOTE — ED Provider Notes (Signed)
[facility] EMERGENCY DEPARTMENT Provider Note   CSN: [id number] Arrival date & time: 05/05/22  0601     History {Add pertinent medical, surgical, social history, OB history to HPI:1} Chief Complaint  Patient presents with   Emesis   Diarrhea    Angela Ray is a 71 y.o. female.  She is here with acute onset of nausea vomiting diarrhea that started about 1 AM this morning.  No definite blood from either end.  No fevers.  No real abdominal pain.  She does have some throat discomfort from her vomiting.  No sick contacts or recent travel.  The history is provided by the patient.  Emesis Severity:  Severe Duration:  7 hours Timing:  Intermittent Progression:  Unchanged Chronicity:  New Relieved by:  Nothing Worsened by:  Liquids Ineffective treatments:  None tried Associated symptoms: diarrhea and sore throat   Associated symptoms: no abdominal pain, no chills, no cough and no fever   Diarrhea Associated symptoms: vomiting   Associated symptoms: no abdominal pain, no chills and no fever        Home Medications Prior to Admission medications   Medication Sig Start Date End Date Taking? Authorizing Provider  Azelaic Acid 15 % cream Apply 1 application topically every morning. 02/13/19   [provider]  busPIRone (BUSPAR) 30 MG tablet Take 1 tablet by mouth 2 times a day. 03/26/21     cetirizine (ZYRTEC) 10 MG tablet Take 10 mg by mouth daily as needed for allergies.     [provider]  clonazePAM (KLONOPIN) 1 MG tablet Take 1 tablet by mouth 3 (three) times daily. 10/27/18   [provider]  Cyanocobalamin (B-12) 2500 MCG TABS Take by mouth daily.    [provider]  cyclobenzaprine (FLEXERIL) 10 MG tablet TAKE 1 TABLET BY MOUTH TWICE A DAY AS NEEDED FOR MUSCLE SPASMS 09/27/21   Carollee Herter, Alferd Apa, DO  ESTRACE VAGINAL 0.1 MG/GM vaginal cream Apply 1 application topically daily. Apply small amount externally to vaginal area  daily 01/31/15   [provider]  fluticasone (CUTIVATE) 0.05 % cream Apply 1 application topically 2 (two) times daily. 01/12/19   [provider]  fluticasone (FLONASE) 50 MCG/ACT nasal spray Place 2 sprays into both nostrils at bedtime as needed for allergies. 05/20/21   Copland, Gay Filler, MD  gabapentin (NEURONTIN) 300 MG capsule as needed. 01/24/15   [provider]  hydrocortisone (ANUSOL-HC) 2.5 % rectal cream Place 1 application rectally 2 (two) times daily. 06/10/19   Mauri Pole, MD  ibuprofen (ADVIL,MOTRIN) 600 MG tablet TAKE 1 TABLET (600 MG TOTAL) BY MOUTH EVERY 8 (EIGHT) HOURS AS NEEDED (PAIN). 06/08/18   Copland, Gay Filler, MD  ipratropium (ATROVENT) 0.06 % nasal spray Place 2 sprays into both nostrils 4 (four) times daily. 02/11/22   Margaretha Seeds, MD  lidocaine (XYLOCAINE) 5 % ointment Apply daily as needed 12/30/16   [provider]  Lifitegrast Shirley Friar) 5 % SOLN Apply 1 drop to eye daily.    [provider]  linaclotide Rolan Lipa) 72 MCG capsule Take 1 capsule (72 mcg total) by mouth daily before breakfast. 01/15/22   Copland, Gay Filler, MD  metroNIDAZOLE (METROCREAM) 0.75 % cream Apply topically daily as needed. 02/11/22   Copland, Gay Filler, MD  Multiple Vitamins-Minerals (HAIR/SKIN/NAILS/BIOTIN PO) Take by mouth daily.    [provider]  OVER THE COUNTER MEDICATION Take 1 capsule by mouth 3 (three) times daily. Hydro eye  [provider]  PARoxetine (PAXIL) 20 MG tablet Take 20 mg by mouth 2 (two) times daily. 02/10/18   [provider]  propranolol (INDERAL) 20 MG tablet TAKE 1 TABLET BY MOUTH TWICE A DAY 02/27/22   Copland, Gay Filler, MD  traMADol (ULTRAM-ER) 100 MG 24 hr tablet Take 100 mg by mouth daily.    [provider]  tretinoin (RETIN-A) 0.025 % cream Apply 1 application topically at bedtime. 01/29/19   [provider]  trimethoprim (TRIMPEX) 100 MG tablet Take 1 tablet by mouth  daily. 01/31/15   [provider]      Allergies    Aciphex [rabeprazole sodium], Bupropion hcl, Cephalexin, Codeine, Esomeprazole magnesium, Sertraline hcl, Linzess [linaclotide], Prednisone, and Doxycycline hyclate    Review of Systems   Review of Systems  Constitutional:  Negative for chills and fever.  HENT:  Positive for sore throat.   Eyes:  Negative for visual disturbance.  Respiratory:  Negative for cough.   Gastrointestinal:  Positive for diarrhea, nausea and vomiting. Negative for abdominal pain.  Genitourinary:  Negative for dysuria.    Physical Exam Updated Vital Signs BP 103/69   Pulse 98   Temp 98.3 F (36.8 C) (Oral)   Resp 20   Ht '5\' 11"'$  (1.803 m)   Wt 68.9 kg   LMP 02/09/2002 (Approximate)   SpO2 100%   BMI 21.20 kg/m  Physical Exam Vitals and nursing note reviewed.  Constitutional:      General: She is not in acute distress.    Appearance: Normal appearance. She is well-developed.  HENT:     Head: Normocephalic and atraumatic.  Eyes:     Conjunctiva/sclera: Conjunctivae normal.  Cardiovascular:     Rate and Rhythm: Normal rate and regular rhythm.     Heart sounds: No murmur heard. Pulmonary:     Effort: Pulmonary effort is normal. No respiratory distress.     Breath sounds: Normal breath sounds.  Abdominal:     Palpations: Abdomen is soft.     Tenderness: There is no abdominal tenderness. There is no guarding or rebound.  Musculoskeletal:        General: Normal range of motion.     Cervical back: Neck supple.     Right lower leg: No edema.     Left lower leg: No edema.  Skin:    General: Skin is warm and dry.     Capillary Refill: Capillary refill takes less than 2 seconds.  Neurological:     General: No focal deficit present.     Mental Status: She is alert.     Sensory: No sensory deficit.     Motor: No weakness.     ED Results / Procedures / Treatments   Labs (all labs ordered are listed, but only abnormal results are  displayed) Labs Reviewed  LIPASE, BLOOD - Abnormal; Notable for the following components:      Result Value   Lipase 54 (*)    All other components within normal limits  COMPREHENSIVE METABOLIC PANEL - Abnormal; Notable for the following components:   Glucose, Bld 139 (*)    All other components within normal limits  CBC  URINALYSIS, ROUTINE W REFLEX MICROSCOPIC    EKG None  Radiology No results found.  Procedures Procedures  {Document cardiac monitor, telemetry assessment procedure when appropriate:1}  Medications Ordered in ED Medications  sodium chloride 0.9 % bolus 1,000 mL (has no administration in time range)  ondansetron (ZOFRAN) injection 4 mg (  has no administration in time range)  famotidine (PEPCID) IVPB 20 mg premix (has no administration in time range)  ondansetron (ZOFRAN-ODT) disintegrating tablet 4 mg (4 mg Oral Given 05/05/22 8329)    ED Course/ Medical Decision Making/ A&P                           Medical Decision Making Amount and/or Complexity of Data Reviewed Labs: ordered.  Risk Prescription drug management.   This patient complains of ***; this involves an extensive number of treatment Options and is a complaint that carries with it a high risk of complications and morbidity. The differential includes ***  I ordered, reviewed and interpreted labs, which included *** I ordered medication *** and reviewed PMP when indicated. I ordered imaging studies which included *** and I independently    visualized and interpreted imaging which showed *** Additional history obtained from *** Previous records obtained and reviewed *** I consulted *** and discussed lab and imaging findings and discussed disposition.  Cardiac monitoring reviewed, *** Social determinants considered, *** Critical Interventions: ***  After the interventions stated above, I reevaluated the patient and found *** Admission and further testing considered, ***   {Document  critical care time when appropriate:1} {Document review of labs and clinical decision tools ie heart score, Chads2Vasc2 etc:1}  {Document your independent review of radiology images, and any outside records:1} {Document your discussion with family members, caretakers, and with consultants:1} {Document social determinants of health affecting pt's care:1} {Document your decision making why or why not admission, treatments were needed:1} Final Clinical Impression(s) / ED Diagnoses Final diagnoses:  None    Rx / DC Orders ED Discharge Orders     None

## 2022-05-05 NOTE — ED Notes (Signed)
Pt reports return of nausea.

## 2022-05-05 NOTE — ED Triage Notes (Signed)
NVD since 0115.

## 2022-05-05 NOTE — ED Notes (Signed)
Pt reports nausea has returned, reports last ate last night at 1730

## 2022-05-05 NOTE — Discharge Instructions (Addendum)
You were seen in the emergency department for nausea vomiting diarrhea.  Your lab work did not show any significant abnormalities and your symptoms improved with some nausea medication and fluids.  We are prescribing you nausea medication.  Please start with a clear liquid diet advance as tolerated.  Return to the emergency department if any worsening or concerning symptoms.

## 2022-05-05 NOTE — ED Notes (Signed)
ED Provider at bedside. 

## 2022-05-05 NOTE — ED Notes (Signed)
Per EDP order, pt given fluids and/or food for PO challenge. Pt verbalized understanding to utilize call bell if nausea or emesis occur. 

## 2022-06-05 ENCOUNTER — Telehealth (INDEPENDENT_AMBULATORY_CARE_PROVIDER_SITE_OTHER): Payer: Medicare Other | Admitting: Medical

## 2022-06-05 ENCOUNTER — Encounter: Payer: Self-pay | Admitting: Medical

## 2022-06-05 ENCOUNTER — Other Ambulatory Visit (HOSPITAL_BASED_OUTPATIENT_CLINIC_OR_DEPARTMENT_OTHER): Payer: Self-pay

## 2022-06-05 VITALS — BP 110/60

## 2022-06-05 DIAGNOSIS — J3089 Other allergic rhinitis: Secondary | ICD-10-CM | POA: Diagnosis not present

## 2022-06-05 DIAGNOSIS — U071 COVID-19: Secondary | ICD-10-CM

## 2022-06-05 MED ORDER — AZITHROMYCIN 250 MG PO TABS
ORAL_TABLET | ORAL | 0 refills | Status: AC
  Filled 2022-06-05: qty 6, 5d supply, fill #0

## 2022-06-05 MED ORDER — BENZONATATE 100 MG PO CAPS
100.0000 mg | ORAL_CAPSULE | Freq: Three times a day (TID) | ORAL | 0 refills | Status: DC | PRN
  Filled 2022-06-05: qty 30, 10d supply, fill #0

## 2022-06-05 MED ORDER — NIRMATRELVIR/RITONAVIR (PAXLOVID)TABLET
3.0000 | ORAL_TABLET | Freq: Two times a day (BID) | ORAL | 0 refills | Status: AC
  Filled 2022-06-05: qty 30, 5d supply, fill #0

## 2022-06-05 NOTE — Progress Notes (Signed)
Subjective:    Patient ID: Angela Ray, female    DOB: 29-Jul-1950, 72 y.o.   MRN: [id number]  HPI  Virtual Visit via Video Note  I connected with Angela Ray on 06/05/22 at 11:20 AM EST by a video enabled telemedicine application and verified that I am speaking with the correct person using two identifiers.  Location: Patient: home Provider: office   I discussed the limitations of evaluation and management by telemedicine and the availability of in person appointments. The patient expressed understanding and agreed to proceed.  History of Present Illness: 2 days ago got st and then later had some roaring in ears. Now having some productive cough. This morning she began to have severe bodyaches.   Pt has RA, fibromyalgia and OA.  Pt states also recently dx with MAC.   Visit Diagnoses       Atypical mycobacterial infection of lung ([place])    -  Primary             Reviewed ct and symptoms with her. Discuss a potential dx is chronic fungal/ntm infection. I do not suspect tb   For ntm this doesn't look classic lady's windemere syndrome to me in terms of anatomy. And symptomatically it is not impressive for ntm lung disease either   She has multiple confounders to consider r/o such as connective tissue related lung nodules/ILD and also gerd associated pulm parenchymal changes   Discuss with her the low success rate of treatment for ntm lung, high relapse, high abx toxicity so requiring a firm dx before treatment considered. Explain the process might take a few months with more testings and pulmonology evaluation as well   Ct oct 2023. IMPRESSION: 1. Bronchiectasis, peribronchovascular nodularity and mucoid impaction, worst in the upper lobes and right middle lobe, unchanged from 01/20/2022 and compatible with chronic mycobacterium avium complex. 2. Mild subpleural and slightly basilar predominant subpleural reticular densities and ground-glass, indicative of  interstitial lung disease. If further evaluation is desired, high-resolution chest CT without contrast is recommended. 3. Cirrhosis. 4.  Aortic atherosclerosis (ICD10-I70.0).   No recent updated covid vaccine.     Observations/Objective:  General-no acute distress, pleasant, oriented. Lungs- on inspection lungs appear unlabored. Neck- no tracheal deviation or jvd on inspection. Neuro- gross motor function appears intact.   Assessment and Plan: Patient Instructions  Early COVID infection day 2 of symptoms.  On review formally vaccinated at least 1 time.  But none recently.  Paxlovid prescription sent to pharmacy after reviewing patient's med list and discussing with pharmacist as well.  Rx advisement given and did explain to patient medication emergency use authorization status.  Prescribing benzonatate for cough and advising to use Flonase for nasal congestion.  Patient already has vitamin D, zinc and vitamin C which she uses daily.  Prescription of azithromycin sent to pharmacy.  Explained making available to use if she gets worse symptoms as described.  She does have history of bronchiectasis on review of recent CT and history of MAC but not presently on antibiotics per patient.  So did explain would have a lower threshold to start azithromycin if she worsens.  If she does end up having to take the antibiotic would like to know as in that event would also get chest x-ray.  Follow-up in 7 days or sooner if needed.  In addition recommended getting O2 sat monitor over-the-counter and checking those daily.  I discussed will it would be good readings.   Mackie Pai, PA-C  Follow Up Instructions:    I discussed the assessment and treatment plan with the patient. The patient was provided an opportunity to ask questions and all were answered. The patient agreed with the plan and demonstrated an understanding of the instructions.   The patient was advised to call back or seek an  in-person evaluation if the symptoms worsen or if the condition fails to improve as anticipated.     Mackie Pai, PA-C   Review of Systems     Objective:   Physical Exam        Assessment & Plan:

## 2022-06-05 NOTE — Patient Instructions (Signed)
Early COVID infection day 2 of symptoms.  On review formally vaccinated at least 1 time.  But none recently.  Paxlovid prescription sent to pharmacy after reviewing patient's med list and discussing with pharmacist as well.  Rx advisement given and did explain to patient medication emergency use authorization status.  Prescribing benzonatate for cough and advising to use Flonase for nasal congestion.  Patient already has vitamin D, zinc and vitamin C which she uses daily.  Prescription of azithromycin sent to pharmacy.  Explained making available to use if she gets worse symptoms as described.  She does have history of bronchiectasis on review of recent CT and history of MAC but not presently on antibiotics per patient.  So did explain would have a lower threshold to start azithromycin if she worsens.  If she does end up having to take the antibiotic would like to know as in that event would also get chest x-ray.  Follow-up in 7 days or sooner if needed.  In addition recommended getting O2 sat monitor over-the-counter and checking those daily.  I discussed will it would be good readings.

## 2022-06-06 ENCOUNTER — Encounter (HOSPITAL_BASED_OUTPATIENT_CLINIC_OR_DEPARTMENT_OTHER): Payer: Self-pay

## 2022-06-06 ENCOUNTER — Telehealth: Payer: Self-pay | Admitting: Family Medicine

## 2022-06-06 ENCOUNTER — Emergency Department (HOSPITAL_BASED_OUTPATIENT_CLINIC_OR_DEPARTMENT_OTHER)
Admission: EM | Admit: 2022-06-06 | Discharge: 2022-06-06 | Disposition: A | Discharge status: Home / Self Care | Payer: Medicare Other | Attending: Emergency Medicine | Admitting: Emergency Medicine

## 2022-06-06 ENCOUNTER — Emergency Department (HOSPITAL_BASED_OUTPATIENT_CLINIC_OR_DEPARTMENT_OTHER): Payer: Medicare Other

## 2022-06-06 ENCOUNTER — Other Ambulatory Visit: Payer: Self-pay

## 2022-06-06 ENCOUNTER — Other Ambulatory Visit (HOSPITAL_BASED_OUTPATIENT_CLINIC_OR_DEPARTMENT_OTHER): Payer: Self-pay

## 2022-06-06 DIAGNOSIS — U071 COVID-19: Secondary | ICD-10-CM | POA: Insufficient documentation

## 2022-06-06 DIAGNOSIS — R059 Cough, unspecified: Secondary | ICD-10-CM | POA: Diagnosis not present

## 2022-06-06 MED ORDER — ALUM & MAG HYDROXIDE-SIMETH 200-200-20 MG/5ML PO SUSP
30.0000 mL | Freq: Once | ORAL | Status: AC
  Administered 2022-06-06: 30 mL via ORAL
  Filled 2022-06-06: qty 30

## 2022-06-06 MED ORDER — LIDOCAINE VISCOUS HCL 2 % MT SOLN
15.0000 mL | OROMUCOSAL | 0 refills | Status: DC | PRN
  Filled 2022-06-06: qty 100, 1d supply, fill #0

## 2022-06-06 NOTE — Telephone Encounter (Signed)
Patient said her throat feels like "raw hamburger" and that she is coughing a lot. Patient said her temperature is 101, BP is 95/63 and O2 levels 95/83, pulse is 88. Patient said she took one pill for coughing this morning and she took the others yesterday as soon as her husband got back but she wants to know if there is anything else she can do. Please advise.

## 2022-06-06 NOTE — ED Notes (Signed)
Discharge paperwork reviewed entirely with patient, including Rx's and follow up care. Pain was under control. Pt verbalized understanding as well as all parties involved. No questions or concerns voiced at the time of discharge. No acute distress noted.   Pt ambulated out to PVA without incident or assistance.

## 2022-06-06 NOTE — Discharge Instructions (Signed)
Please continue taking your Paxlovid and azithromycin and Tessalon Perles.  Please fill the lidocaine prescription and take it every 4 hours as needed  See your doctor for follow-up.  As we discussed your chest x-ray today does not show a pneumonia.  If you have persistent cough or fever or congestion, please return to the ED to get blood work and CT chest.

## 2022-06-06 NOTE — Telephone Encounter (Signed)
Called her back- she has been sick for about a week She did test positive for covid last week- she thinks about a week ago Her oxygen level has been ok at 98% She does not sound well or herself- I asked her to have her husband (who is home) take her to the ER and she agrees  Angela Ray

## 2022-06-06 NOTE — ED Provider Notes (Signed)
[facility] Provider Note   CSN: [id number] Arrival date & time: 06/06/22  1437     History  Chief Complaint  Patient presents with   Cough   Covid Positive    Angela Ray is a 72 y.o. female hx of MAC, here presenting with cough and sore throat and COVID infection.  Patient was diagnosed with COVID on Monday.  Had a virtual visit with her doctor yesterday and was prescribed Tessalon Perles and azithromycin and Paxlovid yesterday.  Took several doses but still has cough and sore throat.  Sending for chest x-ray.  The history is provided by the patient.       Home Medications Prior to Admission medications   Medication Sig Start Date End Date Taking? Authorizing Provider  azithromycin (ZITHROMAX) 250 MG tablet Take 2 tablets by mouth on day 1, then take 1 tablet daily on days 2 through 5. 06/05/22 06/10/22 Yes Saguier, Percell Miller, PA-C  benzonatate (TESSALON) 100 MG capsule Take 1 capsule (100 mg total) by mouth 3 (three) times daily as needed for cough. 06/05/22  Yes Saguier, Percell Miller, PA-C  nirmatrelvir/ritonavir (PAXLOVID) 20 x 150 MG & 10 x 100MG TABS Take 3 tablets by mouth 2 (two) times daily for 5 days. (Take nirmatrelvir 150 mg two tablets twice daily for 5 days and ritonavir 100 mg one tablet twice daily for 5 days) Patient GFR is 60 06/05/22 06/10/22 Yes Saguier, Percell Miller, PA-C  Azelaic Acid 15 % cream Apply 1 application topically every morning. 02/13/19   [provider]  busPIRone (BUSPAR) 30 MG tablet Take 1 tablet by mouth 2 times a day. 03/26/21     cetirizine (ZYRTEC) 10 MG tablet Take 10 mg by mouth daily as needed for allergies.     [provider]  clonazePAM (KLONOPIN) 1 MG tablet Take 1 tablet by mouth 3 (three) times daily. 10/27/18   [provider]  Cyanocobalamin (B-12) 2500 MCG TABS Take by mouth daily.    [provider]  cyclobenzaprine (FLEXERIL) 10 MG tablet TAKE 1 TABLET BY MOUTH  TWICE A DAY AS NEEDED FOR MUSCLE SPASMS 09/27/21   Carollee Herter, Alferd Apa, DO  ESTRACE VAGINAL 0.1 MG/GM vaginal cream Apply 1 application topically daily. Apply small amount externally to vaginal area daily 01/31/15   [provider]  fluticasone (CUTIVATE) 0.05 % cream Apply 1 application topically 2 (two) times daily. 01/12/19   [provider]  fluticasone (FLONASE) 50 MCG/ACT nasal spray Place 2 sprays into both nostrils at bedtime as needed for allergies. 05/20/21   Copland, Gay Filler, MD  gabapentin (NEURONTIN) 300 MG capsule as needed. 01/24/15   [provider]  hydrocortisone (ANUSOL-HC) 2.5 % rectal cream Place 1 application rectally 2 (two) times daily. 06/10/19   Mauri Pole, MD  ibuprofen (ADVIL,MOTRIN) 600 MG tablet TAKE 1 TABLET (600 MG TOTAL) BY MOUTH EVERY 8 (EIGHT) HOURS AS NEEDED (PAIN). 06/08/18   Copland, Gay Filler, MD  ipratropium (ATROVENT) 0.06 % nasal spray Place 2 sprays into both nostrils 4 (four) times daily. 02/11/22   Margaretha Seeds, MD  lidocaine (XYLOCAINE) 5 % ointment Apply daily as needed 12/30/16   [provider]  Lifitegrast Shirley Friar) 5 % SOLN Apply 1 drop to eye daily.    [provider]  linaclotide Rolan Lipa) 72 MCG capsule Take 1 capsule (72 mcg total) by mouth daily before breakfast. 01/15/22   Copland, Gay Filler, MD  metroNIDAZOLE (METROCREAM) 0.75 %  cream Apply topically daily as needed. 02/11/22   Copland, Gay Filler, MD  Multiple Vitamins-Minerals (HAIR/SKIN/NAILS/BIOTIN PO) Take by mouth daily.    [provider]  ondansetron (ZOFRAN) 4 MG tablet Take 1 tablet (4 mg total) by mouth every 8 (eight) hours as needed for nausea or vomiting. 05/05/22   Hayden Rasmussen, MD  OVER THE COUNTER MEDICATION Take 1 capsule by mouth 3 (three) times daily. Hydro eye    [provider]  PARoxetine (PAXIL) 20 MG tablet Take 20 mg by mouth 2 (two) times daily. 02/10/18   [provider]  promethazine  (PHENERGAN) 25 MG suppository Place 1 suppository (25 mg total) rectally every 6 (six) hours as needed for nausea or vomiting. 05/05/22   Hayden Rasmussen, MD  propranolol (INDERAL) 20 MG tablet TAKE 1 TABLET BY MOUTH TWICE A DAY 02/27/22   Copland, Gay Filler, MD  traMADol (ULTRAM-ER) 100 MG 24 hr tablet Take 100 mg by mouth daily.    [provider]  tretinoin (RETIN-A) 0.025 % cream Apply 1 application topically at bedtime. 01/29/19   [provider]  trimethoprim (TRIMPEX) 100 MG tablet Take 1 tablet by mouth daily. 01/31/15   [provider]      Allergies    Aciphex [rabeprazole sodium], Bupropion hcl, Cephalexin, Codeine, Esomeprazole magnesium, Sertraline hcl, Linzess [linaclotide], Prednisone, and Doxycycline hyclate    Review of Systems   Review of Systems  Respiratory:  Positive for cough.   All other systems reviewed and are negative.   Physical Exam Updated Vital Signs BP 106/88 (BP Location: Left Arm)   Pulse 93   Temp (!) 100.9 F (38.3 C) (Oral)   Resp 18   Ht 5' 11"  (1.803 m)   Wt 67.6 kg   LMP 02/09/2002 (Approximate)   SpO2 98%   BMI 20.78 kg/m  Physical Exam Vitals and nursing note reviewed.  Constitutional:      Appearance: Normal appearance.     Comments: Uncomfortable  HENT:     Head: Normocephalic.     Nose: Nose normal.     Mouth/Throat:     Mouth: Mucous membranes are moist.  Eyes:     Extraocular Movements: Extraocular movements intact.     Pupils: Pupils are equal, round, and reactive to light.  Cardiovascular:     Rate and Rhythm: Normal rate and regular rhythm.     Pulses: Normal pulses.     Heart sounds: Normal heart sounds.  Pulmonary:     Effort: Pulmonary effort is normal.     Comments: Diminished bilaterally but no crackles Abdominal:     General: Abdomen is flat.     Palpations: Abdomen is soft.  Musculoskeletal:        General: Normal range of motion.     Cervical back: Normal range of motion and neck  supple.  Skin:    General: Skin is warm.  Neurological:     General: No focal deficit present.     Mental Status: She is alert and oriented to person, place, and time.  Psychiatric:        Mood and Affect: Mood normal.        Behavior: Behavior normal.     ED Results / Procedures / Treatments   Labs (all labs ordered are listed, but only abnormal results are displayed) Labs Reviewed - No data to display  EKG None  Radiology DG Chest 2 View  Result Date: 06/06/2022 CLINICAL DATA:  Cough.  COVID positive EXAM: CHEST - 2 VIEW COMPARISON:  CT 03/03/2022.  X-ray 01/15/2022 FINDINGS: Hyperinflation. No pneumothorax, effusion or edema. Dense nodule again seen in the superior segment right lower lobe with some adjacent ill-defined density, similar to previous. No new consolidation. Normal cardiopericardial silhouette. Fixation hardware seen along the lower cervical spine. IMPRESSION: Hyperinflation with stable nodular areas in the right lung and a calcification in the superior segment of right lower lobe. No new consolidation or effusion. Electronically Signed   By: Jill Side M.D.   On: 06/06/2022 15:21    Procedures Procedures    Medications Ordered in ED Medications  alum & mag hydroxide-simeth (MAALOX/MYLANTA) 200-200-20 MG/5ML suspension 30 mL (30 mLs Oral Given 06/06/22 1543)    ED Course/ Medical Decision Making/ A&P                             Medical Decision Making LAKAISHA DANISH is a 72 y.o. female here presenting with shortness of breath and cough.  Patient likely has shortness of breath and cough from COVID.  Patient's oxygen level is 98%.  Patient's blood pressure is 106/88.  Patient has a low-grade temp 100.9 in the ED.  Given GI cocktail for sore throat.  Patient's chest x-ray showed MAC but no obvious pneumonia.  I discussed getting labs and CT chest to further assess for MAC versus continuing her current course of treatment and if she is not improved in several  days, then she can get labs and CT scan.  She states that she would like to just continue her Paxlovid and azithromycin and Tessalon Perles for now.  She requests something for sore throat I will prescribe some viscous lidocaine.  If she does not improve she will need CT chest and labs.   Problems Addressed: COVID-19: acute illness or injury  Amount and/or Complexity of Data Reviewed Radiology: ordered and independent interpretation performed. Decision-making details documented in ED Course.  Risk OTC drugs.    Final Clinical Impression(s) / ED Diagnoses Final diagnoses:  None    Rx / DC Orders ED Discharge Orders     None         Drenda Freeze, MD 06/06/22 1558

## 2022-06-06 NOTE — ED Triage Notes (Addendum)
Pt presents with complaint of testing positive covid on Monday. S Positive x 2  test. Taking zithromax, paxlovid and tessalon perales Advised to come to ED per Dr copeland to rule out pneumonia Pt has productive cough, raw throat and body aches. No resp distress noted

## 2022-06-06 NOTE — ED Notes (Signed)
ED Provider at bedside. 

## 2022-06-09 ENCOUNTER — Telehealth: Payer: Self-pay | Admitting: Family Medicine

## 2022-06-09 NOTE — Telephone Encounter (Signed)
Pt called to let Dr. Lorelei Pont know that she just tested negative for COVID and she has finished all the meds that were give to her.

## 2022-07-10 ENCOUNTER — Encounter (HOSPITAL_BASED_OUTPATIENT_CLINIC_OR_DEPARTMENT_OTHER): Payer: Self-pay | Admitting: Pulmonary Disease

## 2022-07-10 ENCOUNTER — Ambulatory Visit (INDEPENDENT_AMBULATORY_CARE_PROVIDER_SITE_OTHER): Payer: Medicare Other | Admitting: Pulmonary Disease

## 2022-07-10 VITALS — BP 114/80 | HR 61 | Ht 71.0 in | Wt 151.0 lb

## 2022-07-10 DIAGNOSIS — R9389 Abnormal findings on diagnostic imaging of other specified body structures: Secondary | ICD-10-CM | POA: Diagnosis not present

## 2022-07-10 NOTE — Progress Notes (Signed)
Subjective:   PATIENT ID: Angela Ray GENDER: female DOB: [birth year], MRN: [id number]  Chief Complaint  Patient presents with   Follow-up    Preferred mask to wear in a crowd     Reason for Visit: Follow up For pulmonary nodules and CT results   Initial Consult: Angela Ray is a 72 year old female never smoker with RA not currently on immunosuppressants, fibromyalgia, tremors, anxiety, osteoarthritis, IBS with chronic constipation, hx recurrent UTI, hx skin cancer who presents as a new consult for abnormal CT, cough. Presents with husband who provides additional history.  She reports longstanding nonproductive cough for 8 months. Thought initially related to gardening. Sometimes productive but mostly chest congestion. Occurs at night. Denies wheezing. Has chronic nasal congestion. Tried atrovent nasal spray with improvement. At this point she reports her cough has resolved by >90%. Triggered by strong smells. Previously she noticed the car smelled of mildew. Now has a new car and symptoms has resolved. Changes filters monthly in there home. Has HEPA filter in home.  She was seen by PCP for kidney stones and recent E.coli UTI. Had renal scan completed which incidentally demonstrated 5 mm subpleural nodule in right lower lobe. Dedicated CT chest demonstrate multiple cluster of irregular nodules in the right upper lobe. Treated with azithromycin. Referred to Infectious Disease and had sputum culture collected. Fungal work-up also ordered and overall negative.  Of note, has hx of RA and previously on methotrexate, steroids, humira, enbrel in the past. >15 years since being on immunosuppressants.After switching to Dr. Lincoln Maxin she stopped meds due to side effects including tremors and nerv damage.  07/10/22 Since last visit patient reports that she has been very sick. She reports that she has had the flu and had COVID. She report that she has had Emergency department and visit.  Patient reports that since her illness she has been improving and still has a cough but has improved. Patient has occasional thick white sputum. She also reports some brain fog after her COVID infection. Patient reports that she is now constipated, whereas during her infection she was having diarrhea. She does bring masks with her to show me which one is the best as she is about to go on a plane in March.   Social History: Currently living in facility Has dog. Sleeps in bed with them. Gets wiped down daily.  I have personally reviewed patient's past medical/family/social history, allergies, current medications.  Past Medical History:  Diagnosis Date   Anxiety    Barrett's esophagus    in the past   Colon polyp    Depression    Dry eye syndrome of both eyes    Dupuytren's contracture of both hands 10/2016   sees Dr.Gramig--[facility]   Essential tremor 2016   legs, arm,lower jaw   Female bladder prolapse    Fibroid    Fibromyalgia    GERD (gastroesophageal reflux disease)    Heat exhaustion    Herniated disc, cervical    C3   Hiatal hernia    History of cardiovascular stress test    Lexiscan Myoview 7/16:  EF 65%, inferolateral defect (prob artifactual), no ischemia   History of recurrent UTIs    Hyperlipidemia    IBS (irritable bowel syndrome)    constipation   Leg cramps    Low back pain    Lumbar herniated disc    L4,L5   Osteoarthritis    Osteopenia    Osteoporosis  Osteopenia per pt   Rheumatoid arthritis, adult ([place]) 2016   both feet   Skin cancer    Spinal stenosis    lumbar   Tremors of nervous system    Vertigo      Family History  Problem Relation Age of Onset   Coronary artery disease Father 77       CABG 2002, followd by Dr, Ron Parker   Prostate cancer Father    Bladder Cancer Father    Skin cancer Father    Diabetes Father 82       DM type 2   Atrial fibrillation Mother    Multiple myeloma Mother    Skin cancer Mother    Coronary artery  disease Mother    Irritable bowel syndrome Mother    Diabetes Maternal Grandmother    Hypertension Maternal Grandmother    Diabetes Maternal Grandfather    Hypertension Maternal Grandfather    Osteoarthritis Paternal Grandmother    Cancer Maternal Aunt        bone cancer   Colon cancer Neg Hx    Esophageal cancer Neg Hx    Stomach cancer Neg Hx    Rectal cancer Neg Hx      Social History   Occupational History   Occupation: Retired    Fish farm manager: RETIRED  Tobacco Use   Smoking status: Never   Smokeless tobacco: Never  Vaping Use   Vaping Use: Never used  Substance and Sexual Activity   Alcohol use: No    Alcohol/week: 0.0 standard drinks of alcohol    Comment: occ glass of wine   Drug use: No   Sexual activity: Not Currently    Partners: Male    Birth control/protection: Surgical    Comment: TAH still has ovaries    Allergies  Allergen Reactions   Aciphex [Rabeprazole Sodium] Other (See Comments)    REACTION: "FELT LIKE INDIGESTION"   Bupropion Hcl Other (See Comments)    REACTION: "DIDN'T MAKE ME FEEL RIGHT IN THE HEAD"   Cephalexin Nausea Only and Other (See Comments)    REACTION: "GAVE YEAST INFECTION"   Codeine Other (See Comments)    REACTION: "SEVERE NAUSEA, HEART PALPITATION, PASSING OUT, HEART ATTACK LIKE SYMPTOMS"   Esomeprazole Magnesium Other (See Comments)    Nexium=REACTION: " CAUSE TASTE BUDS TO FALL OUT"   Sertraline Hcl Other (See Comments)    REACTION: "DIDN'T MAKE ME FEEL RIGHT IN THE HEAD"   Linzess [Linaclotide] Diarrhea   Prednisone Other (See Comments)    Suicidal thoughts   Doxycycline Hyclate Nausea Only     Outpatient Medications Prior to Visit  Medication Sig Dispense Refill   Azelaic Acid 15 % cream Apply 1 application topically every morning.     benzonatate (TESSALON) 100 MG capsule Take 1 capsule (100 mg total) by mouth 3 (three) times daily as needed for cough. 30 capsule 0   busPIRone (BUSPAR) 30 MG tablet Take 1 tablet by  mouth 2 times a day. 180 tablet 4   cetirizine (ZYRTEC) 10 MG tablet Take 10 mg by mouth daily as needed for allergies.      clonazePAM (KLONOPIN) 1 MG tablet Take 1 tablet by mouth 3 (three) times daily.     Cyanocobalamin (B-12) 2500 MCG TABS Take by mouth daily.     cyclobenzaprine (FLEXERIL) 10 MG tablet TAKE 1 TABLET BY MOUTH TWICE A DAY AS NEEDED FOR MUSCLE SPASMS 30 tablet 2   ESTRACE VAGINAL 0.1 MG/GM vaginal cream Apply 1 application  topically daily. Apply small amount externally to vaginal area daily     fluticasone (CUTIVATE) 0.05 % cream Apply 1 application topically 2 (two) times daily.     fluticasone (FLONASE) 50 MCG/ACT nasal spray Place 2 sprays into both nostrils at bedtime as needed for allergies. 48 g 4   gabapentin (NEURONTIN) 300 MG capsule as needed.     hydrocortisone (ANUSOL-HC) 2.5 % rectal cream Place 1 application rectally 2 (two) times daily. 30 g 1   ibuprofen (ADVIL,MOTRIN) 600 MG tablet TAKE 1 TABLET (600 MG TOTAL) BY MOUTH EVERY 8 (EIGHT) HOURS AS NEEDED (PAIN). 40 tablet 1   ipratropium (ATROVENT) 0.06 % nasal spray Place 2 sprays into both nostrils 4 (four) times daily. 15 mL 5   lidocaine (XYLOCAINE) 2 % solution Use as directed 15 mLs in the mouth or throat every 4 (four) hours as needed for mouth pain. 100 mL 0   lidocaine (XYLOCAINE) 5 % ointment Apply daily as needed     Lifitegrast (XIIDRA) 5 % SOLN Apply 1 drop to eye daily.     linaclotide (LINZESS) 72 MCG capsule Take 1 capsule (72 mcg total) by mouth daily before breakfast. 30 capsule 4   metroNIDAZOLE (METROCREAM) 0.75 % cream Apply topically daily as needed. 45 g 1   Multiple Vitamins-Minerals (HAIR/SKIN/NAILS/BIOTIN PO) Take by mouth daily.     ondansetron (ZOFRAN) 4 MG tablet Take 1 tablet (4 mg total) by mouth every 8 (eight) hours as needed for nausea or vomiting. 15 tablet 0   OVER THE COUNTER MEDICATION Take 1 capsule by mouth 3 (three) times daily. Hydro eye     PARoxetine (PAXIL) 20 MG tablet  Take 20 mg by mouth 2 (two) times daily.  4   promethazine (PHENERGAN) 25 MG suppository Place 1 suppository (25 mg total) rectally every 6 (six) hours as needed for nausea or vomiting. 12 each 0   propranolol (INDERAL) 20 MG tablet TAKE 1 TABLET BY MOUTH TWICE A DAY 180 tablet 1   traMADol (ULTRAM-ER) 100 MG 24 hr tablet Take 100 mg by mouth daily.     tretinoin (RETIN-A) 0.025 % cream Apply 1 application topically at bedtime.     trimethoprim (TRIMPEX) 100 MG tablet Take 1 tablet by mouth daily.     No facility-administered medications prior to visit.    Review of Systems  Constitutional:  Negative for chills, diaphoresis, fever, malaise/fatigue and weight loss.  HENT:  Negative for congestion and ear pain.   Respiratory:  Positive for cough and sputum production. Negative for hemoptysis, shortness of breath and wheezing.   Cardiovascular:  Negative for chest pain, palpitations and leg swelling.  Gastrointestinal:  Positive for constipation. Negative for heartburn.  Musculoskeletal:  Negative for joint pain.  Psychiatric/Behavioral:  The patient is nervous/anxious.      Objective:   Vitals:   07/10/22 0835  BP: 114/80  Pulse: 61  SpO2: 100%  Weight: 151 lb (68.5 kg)  Height: '5\' 11"'$  (1.803 m)   SpO2: 100 % O2 Device: None (Room air)  Physical Exam: General: Well-appearing, no acute distress HENT: [place], AT Eyes: EOMI, no scleral icterus Respiratory: Clear to auscultation bilaterally.  No crackles, wheezing or rales Cardiovascular: RRR, -M/R/G, no JVD Extremities:-Edema,-tenderness Neuro: AAO x4, CNII-XII grossly intact Psych: Normal mood, normal affect  Data Reviewed:  Imaging: CT Renal Stone Study 12/25/21 - Remarkable for RLL 5 mm nodule. CT Chest 01/20/22 - Nodular clusters bilaterally with largest cluster in the right upper lobe. Also  present in RML and LLL and lingula Chest CT 03/03/2022: Bronchiectasis, peribronchials nodularity and mucoid impaction worst in upper  lobes and right middle lobe, unchanged from previous. Mild subpleural and slightly basilar predominant subpleural reticular densities and ground-glass indicative of interstitial lung diease.   CXR: Hyperinflation with stable nodular areas in the right lung and a calcification in the superior segment of right lower lobe. No new consolidation or effusion.  PFT: None on file  Labs: Coccidioides ab - not detected Histoplasma ab - neg Cryptococcal ag - neg Quantiferon - neg     Assessment & Plan:   Discussion: 72 year old female never smoker who presents for follow up for lung nodules with post COVID symptoms. Patient has been improving but having some memory issues. Conituted work up of atypical CT findings.   #Upper lung nodules  #Atypical CT findings Patient cough has improved. Findings indicative of nodular disease that is unchanged from previous CT scan. Patient previous image findings concerning for TB which testing has been negative but still being worked up. Per ID there is concern for chronic fungal infection or Nontuberculous mycobacteria infection. Patient has had sputum cultures that have been positive for MAC. Given she is asymptomatic at this point, so no need to chase this actively or treat. Not convinced this is ILD, but will obtain high resolution Chest CT.  -Change CT to high resolution CT scan  -Hold off on bronchoscopy with BAL unless patient becomes with persistent symptoms  -Not convinced this is ILD -Follow ID work up with AFBs    #COVID long hauler  Patient repots that she recently had COVID about 1 month ago. She reports that she has since had a brain fog and at times feels tired. She states that normally her recall is great, but after her infection she has felt much different. I believe this is likely post COVID symptoms that I anticipate will take some time to resolve. -Encourage regular activity  -Encourage regular social activity  -Encourage patient to keep diary  with places, locations, and names   Health Maintenance Immunization History  Administered Date(s) Administered   Influenza, High Dose Seasonal PF 03/01/2018, 02/15/2019, 02/07/2020, 02/26/2021, 02/06/2022   Influenza,inj,Quad PF,6+ Mos 02/10/2017   Influenza-Unspecified 03/12/2016   Moderna Sars-Covid-2 Vaccination 05/10/2019, 06/07/2019, 06/18/2019   Pneumococcal Conjugate-13 04/19/2018   Pneumococcal-Unspecified 05/12/2014   Tdap 05/12/2014   Zoster Recombinat (Shingrix) 11/08/2019, 01/16/2020   Zoster, Live 05/12/2014   CT Lung Screen - never smoker  Orders Placed This Encounter  Procedures   CT Chest High Resolution    Standing Status:   Future    Standing Expiration Date:   07/10/2023    Scheduling Instructions:     Please change CT scan on 08/12/2022 to HRCT please    Order Specific Question:   Preferred imaging location?    Answer:   [facility]   No orders of the defined types were placed in this encounter.   No follow-ups on file.  I have spent a total time of 60-minutes on the day of the appointment reviewing prior documentation, coordinating care and discussing medical diagnosis and plan with the patient/family. Imaging, labs and tests included in this note have been reviewed and interpreted independently by me.  Leigh Aurora, DO [facility] 07/10/2022 12:11 PM  Internal medicine resident PGY-1 Office Number [phone]

## 2022-07-10 NOTE — Patient Instructions (Addendum)
Post-COVID fatigue and memory --Encourage regular aerobic activity --Maintain regular social activity --Keeping diary with names and locations  MAC  Sputum + cultures 01/2022 Asymptomatic. No indication for therapy. Followed by ID Change CT to HRCT scan on 08/12/22  Schedule follow-up with me after CT scan. OK to book in blocked spots if limited availability

## 2022-07-18 ENCOUNTER — Other Ambulatory Visit: Payer: Self-pay | Admitting: Pulmonary Disease

## 2022-07-26 ENCOUNTER — Other Ambulatory Visit: Payer: Self-pay | Admitting: Family Medicine

## 2022-07-26 DIAGNOSIS — G250 Essential tremor: Secondary | ICD-10-CM

## 2022-08-12 ENCOUNTER — Ambulatory Visit (HOSPITAL_BASED_OUTPATIENT_CLINIC_OR_DEPARTMENT_OTHER)
Admission: RE | Admit: 2022-08-12 | Discharge: 2022-08-12 | Disposition: A | Discharge status: Home / Self Care | Payer: Medicare Other | Source: Ambulatory Visit | Attending: Pulmonary Disease | Admitting: Pulmonary Disease

## 2022-08-12 ENCOUNTER — Other Ambulatory Visit (HOSPITAL_BASED_OUTPATIENT_CLINIC_OR_DEPARTMENT_OTHER): Payer: Medicare Other

## 2022-08-12 DIAGNOSIS — R9389 Abnormal findings on diagnostic imaging of other specified body structures: Secondary | ICD-10-CM

## 2022-08-12 DIAGNOSIS — J479 Bronchiectasis, uncomplicated: Secondary | ICD-10-CM | POA: Diagnosis not present

## 2022-08-13 DIAGNOSIS — F4310 Post-traumatic stress disorder, unspecified: Secondary | ICD-10-CM | POA: Diagnosis not present

## 2022-08-13 DIAGNOSIS — F4323 Adjustment disorder with mixed anxiety and depressed mood: Secondary | ICD-10-CM | POA: Diagnosis not present

## 2022-08-14 ENCOUNTER — Ambulatory Visit (INDEPENDENT_AMBULATORY_CARE_PROVIDER_SITE_OTHER): Payer: Medicare Other | Admitting: Pulmonary Disease

## 2022-08-14 ENCOUNTER — Encounter (HOSPITAL_BASED_OUTPATIENT_CLINIC_OR_DEPARTMENT_OTHER): Payer: Self-pay | Admitting: Pulmonary Disease

## 2022-08-14 VITALS — BP 110/64 | HR 61 | Ht 71.0 in | Wt 152.6 lb

## 2022-08-14 DIAGNOSIS — R918 Other nonspecific abnormal finding of lung field: Secondary | ICD-10-CM

## 2022-08-14 NOTE — Patient Instructions (Addendum)
Multiple lung nodules Hx +MAC Cough - resolved Given she is asymptomatic at this point, so no need to chase this actively or treat.  -Reviewed CT imaging. Overall improved with some areas of increased nodularity in the RUL -No evidence of ILD on CT -Hold off on bronchoscopy with BAL unless patient becomes with persistent symptoms  -Follow ID. Will contact Dr. Gale Journey regarding my evaluation -ORDER CT Chest without contrast in 3 months

## 2022-08-14 NOTE — Progress Notes (Signed)
Subjective:   PATIENT ID: Angela Ray GENDER: female DOB: [birth year], MRN: [id number]  Chief Complaint  Patient presents with   Follow-up    Abnormal ct     Reason for Visit: Follow up for pulmonary nodules and CT results   Initial Consult: Angela Ray is a 72 year old female never smoker with RA not currently on immunosuppressants, fibromyalgia, tremors, anxiety, osteoarthritis, IBS with chronic constipation, hx recurrent UTI, hx skin cancer who presents for follow-up for abnormal CT, cough.   Initial consult She reports longstanding nonproductive cough for 8 months. Thought initially related to gardening. Sometimes productive but mostly chest congestion. Occurs at night. Denies wheezing. Has chronic nasal congestion. Tried atrovent nasal spray with improvement. At this point she reports her cough has resolved by >90%. Triggered by strong smells. Previously she noticed the car smelled of mildew. Now has a new car and symptoms has resolved. Changes filters monthly in there home. Has HEPA filter in home.  She was seen by PCP for kidney stones and recent E.coli UTI. Had renal scan completed which incidentally demonstrated 5 mm subpleural nodule in right lower lobe. Dedicated CT chest demonstrate multiple cluster of irregular nodules in the right upper lobe. Treated with azithromycin. Referred to Infectious Disease and had sputum culture collected. Fungal work-up also ordered and overall negative.  Of note, has hx of RA and previously on methotrexate, steroids, humira, enbrel in the past. >15 years since being on immunosuppressants.After switching to Dr. Launa Flight she stopped meds due to side effects including tremors and nerv damage.  07/10/22 Since last visit patient reports that she has been very sick. She reports that she has had the flu and had COVID. She report that she has had Emergency department and visit. Patient reports that since her illness she has been improving  and still has a cough but has improved. Patient has occasional thick white sputum. She also reports some brain fog after her COVID infection. Patient reports that she is now constipated, whereas during her infection she was having diarrhea. She does bring masks with her to show me which one is the best as she is about to go on a plane in March.   08/14/22 Cough has improved but has had some breakthrough with cough. Denies shortness of breath, night sweats or unintentional weight loss. Has restarted exercising including walking.   Social History: Currently living in facility Has dog. Sleeps in bed with them. Gets wiped down daily.  Past Medical History:  Diagnosis Date   Anxiety    Barrett's esophagus    in the past   Colon polyp    Depression    Dry eye syndrome of both eyes    Dupuytren's contracture of both hands 10/2016   sees Dr.Gramig--[facility]   Essential tremor 2016   legs, arm,lower jaw   Female bladder prolapse    Fibroid    Fibromyalgia    GERD (gastroesophageal reflux disease)    Heat exhaustion    Herniated disc, cervical    C3   Hiatal hernia    History of cardiovascular stress test    Lexiscan Myoview 7/16:  EF 65%, inferolateral defect (prob artifactual), no ischemia   History of recurrent UTIs    Hyperlipidemia    IBS (irritable bowel syndrome)    constipation   Leg cramps    Low back pain    Lumbar herniated disc    L4,L5   Osteoarthritis    Osteopenia  Osteoporosis    Osteopenia per pt   Rheumatoid arthritis, adult 2016   both feet   Skin cancer    Spinal stenosis    lumbar   Tremors of nervous system    Vertigo      Family History  Problem Relation Age of Onset   Coronary artery disease Father 45       CABG 2002, followd by Dr, Myrtis Ser   Prostate cancer Father    Bladder Cancer Father    Skin cancer Father    Diabetes Father 18       DM type 2   Atrial fibrillation Mother    Multiple myeloma Mother    Skin cancer Mother     Coronary artery disease Mother    Irritable bowel syndrome Mother    Diabetes Maternal Grandmother    Hypertension Maternal Grandmother    Diabetes Maternal Grandfather    Hypertension Maternal Grandfather    Osteoarthritis Paternal Grandmother    Cancer Maternal Aunt        bone cancer   Colon cancer Neg Hx    Esophageal cancer Neg Hx    Stomach cancer Neg Hx    Rectal cancer Neg Hx      Social History   Occupational History   Occupation: Retired    Associate Professor: RETIRED  Tobacco Use   Smoking status: Never   Smokeless tobacco: Never  Vaping Use   Vaping Use: Never used  Substance and Sexual Activity   Alcohol use: No    Alcohol/week: 0.0 standard drinks of alcohol    Comment: occ glass of wine   Drug use: No   Sexual activity: Not Currently    Partners: Male    Birth control/protection: Surgical    Comment: TAH still has ovaries    Allergies  Allergen Reactions   Aciphex [Rabeprazole Sodium] Other (See Comments)    REACTION: "FELT LIKE INDIGESTION"   Bupropion Hcl Other (See Comments)    REACTION: "DIDN'T MAKE ME FEEL RIGHT IN THE HEAD"   Cephalexin Nausea Only and Other (See Comments)    REACTION: "GAVE YEAST INFECTION"   Codeine Other (See Comments)    REACTION: "SEVERE NAUSEA, HEART PALPITATION, PASSING OUT, HEART ATTACK LIKE SYMPTOMS"   Esomeprazole Magnesium Other (See Comments)    Nexium=REACTION: " CAUSE TASTE BUDS TO FALL OUT"   Sertraline Hcl Other (See Comments)    REACTION: "DIDN'T MAKE ME FEEL RIGHT IN THE HEAD"   Linzess [Linaclotide] Diarrhea   Prednisone Other (See Comments)    Suicidal thoughts   Doxycycline Hyclate Nausea Only     Outpatient Medications Prior to Visit  Medication Sig Dispense Refill   Azelaic Acid 15 % cream Apply 1 application topically every morning.     busPIRone (BUSPAR) 30 MG tablet Take 1 tablet by mouth 2 times a day. 180 tablet 4   cetirizine (ZYRTEC) 10 MG tablet Take 10 mg by mouth daily as needed for allergies.       clonazePAM (KLONOPIN) 1 MG tablet Take 1 tablet by mouth 3 (three) times daily.     Cyanocobalamin (B-12) 2500 MCG TABS Take by mouth daily.     cyclobenzaprine (FLEXERIL) 10 MG tablet TAKE 1 TABLET BY MOUTH TWICE A DAY AS NEEDED FOR MUSCLE SPASMS 30 tablet 2   ESTRACE VAGINAL 0.1 MG/GM vaginal cream Apply 1 application topically daily. Apply small amount externally to vaginal area daily     fluticasone (CUTIVATE) 0.05 % cream Apply 1 application topically  2 (two) times daily.     fluticasone (FLONASE) 50 MCG/ACT nasal spray Place 2 sprays into both nostrils at bedtime as needed for allergies. 48 g 4   gabapentin (NEURONTIN) 300 MG capsule as needed.     ibuprofen (ADVIL,MOTRIN) 600 MG tablet TAKE 1 TABLET (600 MG TOTAL) BY MOUTH EVERY 8 (EIGHT) HOURS AS NEEDED (PAIN). 40 tablet 1   ipratropium (ATROVENT) 0.06 % nasal spray PLACE 2 SPRAYS INTO BOTH NOSTRILS 4 TIMES DAILY. 15 mL 5   lidocaine (XYLOCAINE) 5 % ointment Apply daily as needed     Lifitegrast (XIIDRA) 5 % SOLN Apply 1 drop to eye daily.     metroNIDAZOLE (METROCREAM) 0.75 % cream Apply topically daily as needed. 45 g 1   Multiple Vitamins-Minerals (HAIR/SKIN/NAILS/BIOTIN PO) Take by mouth daily.     OVER THE COUNTER MEDICATION Take 1 capsule by mouth 3 (three) times daily. Hydro eye     PARoxetine (PAXIL) 20 MG tablet Take 20 mg by mouth 2 (two) times daily.  4   propranolol (INDERAL) 20 MG tablet Take 1 tablet (20 mg total) by mouth 2 (two) times daily. 180 tablet 0   traMADol (ULTRAM-ER) 100 MG 24 hr tablet Take 100 mg by mouth daily.     tretinoin (RETIN-A) 0.025 % cream Apply 1 application topically at bedtime.     trimethoprim (TRIMPEX) 100 MG tablet Take 1 tablet by mouth daily.     benzonatate (TESSALON) 100 MG capsule Take 1 capsule (100 mg total) by mouth 3 (three) times daily as needed for cough. (Patient not taking: Reported on 08/14/2022) 30 capsule 0   hydrocortisone (ANUSOL-HC) 2.5 % rectal cream Place 1 application  rectally 2 (two) times daily. (Patient not taking: Reported on 08/14/2022) 30 g 1   lidocaine (XYLOCAINE) 2 % solution Use as directed 15 mLs in the mouth or throat every 4 (four) hours as needed for mouth pain. (Patient not taking: Reported on 08/14/2022) 100 mL 0   linaclotide (LINZESS) 72 MCG capsule Take 1 capsule (72 mcg total) by mouth daily before breakfast. (Patient not taking: Reported on 08/14/2022) 30 capsule 4   ondansetron (ZOFRAN) 4 MG tablet Take 1 tablet (4 mg total) by mouth every 8 (eight) hours as needed for nausea or vomiting. (Patient not taking: Reported on 08/14/2022) 15 tablet 0   promethazine (PHENERGAN) 25 MG suppository Place 1 suppository (25 mg total) rectally every 6 (six) hours as needed for nausea or vomiting. (Patient not taking: Reported on 08/14/2022) 12 each 0   No facility-administered medications prior to visit.    Review of Systems  Constitutional:  Negative for chills, diaphoresis, fever, malaise/fatigue and weight loss.  HENT:  Negative for congestion.   Respiratory:  Positive for cough. Negative for hemoptysis, sputum production, shortness of breath and wheezing.   Cardiovascular:  Negative for chest pain, palpitations and leg swelling.     Objective:   Vitals:   08/14/22 1042  BP: 110/64  Pulse: 61  SpO2: 98%  Weight: 152 lb 9.6 oz (69.2 kg)  Height: 5\' 11"  (1.803 m)   SpO2: 98 % O2 Device: None (Room air)  Physical Exam: General: Well-appearing, no acute distress HENT: [place], AT Eyes: EOMI, no scleral icterus Respiratory: Clear to auscultation bilaterally.  No crackles, wheezing or rales Cardiovascular: RRR, -M/R/G, no JVD Extremities:-Edema,-tenderness Neuro: AAO x4, CNII-XII grossly intact Psych: Normal mood, normal affect  Data Reviewed:  Imaging: CT Renal Stone Study 12/25/21 - Remarkable for RLL 5 mm nodule.  CT Chest 01/20/22 - Nodular clusters bilaterally with largest cluster in the right upper lobe. Also present in RML and LLL and  lingula Chest CT 03/03/2022: Bronchiectasis, peribronchials nodularity and mucoid impaction worst in upper lobes and right middle lobe, unchanged from previous. Mild subpleural and slightly basilar predominant subpleural reticular densities and ground-glass indicative of interstitial lung diease.   CXR 06/06/22: Hyperinflation with stable nodular areas in the right lung and a calcification in the superior segment of right lower lobe. No new consolidation or effusion. CT Chest HR 08/12/22 - Compared to prior CT, improved especially in lower lobes. Bronchiectasis similar with scattered nodules  PFT: None on file  Labs: Coccidioides ab - not detected Histoplasma ab - neg Cryptococcal ag - neg Quantiferon - neg     Assessment & Plan:   Discussion: 72 year old female never smoker who presents for follow-up for lung nodules and post-covid cough. Nearly resolved cough. Reviewed CT imaging as noted below.  #Upper lung nodules  #Atypical CT findings/+MAC - overall asymptomatic with improving cough Patient cough has improved. Findings indicative of nodular disease that is unchanged from previous CT scan. Patient previous image findings concerning for TB which testing has been negative but still being worked up. Per ID there is concern for chronic fungal infection or Nontuberculous mycobacteria infection. Patient has had sputum cultures that have been positive for MAC. Given she is asymptomatic at this point, so no need to chase this actively or treat.  -Reviewed CT imaging. Overall improved with some areas of increased nodularity in the RUL -Hold off on bronchoscopy with BAL unless patient becomes with persistent symptoms  -Not convinced this is ILD -Follow ID. Will contact Dr. Renold Don regarding my evaluation  #COVID long hauler  Patient repots that she recently had COVID about 1 month ago. She reports that she has since had a brain fog and at times feels tired. She states that normally her recall is  great, but after her infection she has felt much different. I believe this is likely post COVID symptoms that I anticipate will take some time to resolve. -Encourage regular activity  -Encourage regular social activity  -Encourage patient to keep diary with places, locations, and names for any memory issues  Health Maintenance Immunization History  Administered Date(s) Administered   Influenza, High Dose Seasonal PF 03/01/2018, 02/15/2019, 02/07/2020, 02/26/2021, 02/06/2022   Influenza,inj,Quad PF,6+ Mos 02/10/2017   Influenza-Unspecified 03/12/2016   Moderna Sars-Covid-2 Vaccination 05/10/2019, 06/07/2019, 06/18/2019   Pneumococcal Conjugate-13 04/19/2018   Pneumococcal-Unspecified 05/12/2014   Tdap 05/12/2014   Zoster Recombinat (Shingrix) 11/08/2019, 01/16/2020   Zoster, Live 05/12/2014   CT Lung Screen - never smoker  Orders Placed This Encounter  Procedures   CT Chest Wo Contrast    Standing Status:   Future    Standing Expiration Date:   08/14/2023    Scheduling Instructions:     Schedule in 3 months    Order Specific Question:   Preferred imaging location?    Answer:   [facility]   No orders of the defined types were placed in this encounter.   Return in about 3 months (around 11/13/2022) for after CT scan.  I have spent a total time of 30-minutes on the day of the appointment including chart review, data review, collecting history, coordinating care and discussing medical diagnosis and plan with the patient/family. Past medical history, allergies, medications were reviewed. Pertinent imaging, labs and tests included in this note have been reviewed and interpreted independently by  me.  Angela Siever Mechele CollinJane Raphael Fitzpatrick, MD [facility] 08/14/2022 11:02 AM

## 2022-08-15 ENCOUNTER — Telehealth: Payer: Self-pay | Admitting: Family Medicine

## 2022-08-15 NOTE — Telephone Encounter (Signed)
Contacted Angela Ray to schedule their annual wellness visit. Appointment made for 08/22/2022.  Verlee Rossetti; Care Guide Ambulatory Clinical Support [facility] Direct Dial: [phone]

## 2022-08-19 ENCOUNTER — Encounter (HOSPITAL_BASED_OUTPATIENT_CLINIC_OR_DEPARTMENT_OTHER): Payer: Self-pay | Admitting: Pulmonary Disease

## 2022-08-22 ENCOUNTER — Ambulatory Visit (INDEPENDENT_AMBULATORY_CARE_PROVIDER_SITE_OTHER): Payer: Medicare Other | Admitting: *Deleted

## 2022-08-22 DIAGNOSIS — Z0000 Encounter for general adult medical examination without abnormal findings: Secondary | ICD-10-CM

## 2022-08-22 NOTE — Patient Instructions (Signed)
Angela Ray , Thank you for taking time to come for your Medicare Wellness Visit. I appreciate your ongoing commitment to your health goals. Please review the following plan we discussed and let me know if I can assist you in the future.     This is a list of the screening recommended for you and due dates:  Health Maintenance  Topic Date Due   Pneumonia Vaccine (2 of 2 - PPSV23 or PCV20) 04/20/2019   COVID-19 Vaccine (4 - 2023-24 season) 01/10/2022   Colon Cancer Screening  06/06/2022   Flu Shot  12/11/2022   Mammogram  12/13/2022   Medicare Annual Wellness Visit  08/22/2023   DTaP/Tdap/Td vaccine (2 - Td or Tdap) 05/12/2024   DEXA scan (bone density measurement)  Completed   Hepatitis C Screening: USPSTF Recommendation to screen - Ages 54-79 yo.  Completed   Zoster (Shingles) Vaccine  Completed   HPV Vaccine  Aged Out    Next appointment: Follow up in one year for your annual wellness visit.   Preventive Care 49 Years and Older, Female Preventive care refers to lifestyle choices and visits with your health care provider that can promote health and wellness. What does preventive care include? A yearly physical exam. This is also called an annual well check. Dental exams once or twice a year. Routine eye exams. Ask your health care provider how often you should have your eyes checked. Personal lifestyle choices, including: Daily care of your teeth and gums. Regular physical activity. Eating a healthy diet. Avoiding tobacco and drug use. Limiting alcohol use. Practicing safe sex. Taking low-dose aspirin every day. Taking vitamin and mineral supplements as recommended by your health care provider. What happens during an annual well check? The services and screenings done by your health care provider during your annual well check will depend on your age, overall health, lifestyle risk factors, and family history of disease. Counseling  Your health care provider may ask you  questions about your: Alcohol use. Tobacco use. Drug use. Emotional well-being. Home and relationship well-being. Sexual activity. Eating habits. History of falls. Memory and ability to understand (cognition). Work and work Astronomer. Reproductive health. Screening  You may have the following tests or measurements: Height, weight, and BMI. Blood pressure. Lipid and cholesterol levels. These may be checked every 5 years, or more frequently if you are over 42 years old. Skin check. Lung cancer screening. You may have this screening every year starting at age 23 if you have a 30-pack-year history of smoking and currently smoke or have quit within the past 15 years. Fecal occult blood test (FOBT) of the stool. You may have this test every year starting at age 42. Flexible sigmoidoscopy or colonoscopy. You may have a sigmoidoscopy every 5 years or a colonoscopy every 10 years starting at age 11. Hepatitis C blood test. Hepatitis B blood test. Sexually transmitted disease (STD) testing. Diabetes screening. This is done by checking your blood sugar (glucose) after you have not eaten for a while (fasting). You may have this done every 1-3 years. Bone density scan. This is done to screen for osteoporosis. You may have this done starting at age 17. Mammogram. This may be done every 1-2 years. Talk to your health care provider about how often you should have regular mammograms. Talk with your health care provider about your test results, treatment options, and if necessary, the need for more tests. Vaccines  Your health care provider may recommend certain vaccines, such as: Influenza  vaccine. This is recommended every year. Tetanus, diphtheria, and acellular pertussis (Tdap, Td) vaccine. You may need a Td booster every 10 years. Zoster vaccine. You may need this after age 42. Pneumococcal 13-valent conjugate (PCV13) vaccine. One dose is recommended after age 77. Pneumococcal polysaccharide  (PPSV23) vaccine. One dose is recommended after age 13. Talk to your health care provider about which screenings and vaccines you need and how often you need them. This information is not intended to replace advice given to you by your health care provider. Make sure you discuss any questions you have with your health care provider. Document Released: 05/25/2015 Document Revised: 01/16/2016 Document Reviewed: 02/27/2015 Elsevier Interactive Patient Education  2017 [place] Prevention in the Home Falls can cause injuries. They can happen to people of all ages. There are many things you can do to make your home safe and to help prevent falls. What can I do on the outside of my home? Regularly fix the edges of walkways and driveways and fix any cracks. Remove anything that might make you trip as you walk through a door, such as a raised step or threshold. Trim any bushes or trees on the path to your home. Use bright outdoor lighting. Clear any walking paths of anything that might make someone trip, such as rocks or tools. Regularly check to see if handrails are loose or broken. Make sure that both sides of any steps have handrails. Any raised decks and porches should have guardrails on the edges. Have any leaves, snow, or ice cleared regularly. Use sand or salt on walking paths during winter. Clean up any spills in your garage right away. This includes oil or grease spills. What can I do in the bathroom? Use night lights. Install grab bars by the toilet and in the tub and shower. Do not use towel bars as grab bars. Use non-skid mats or decals in the tub or shower. If you need to sit down in the shower, use a plastic, non-slip stool. Keep the floor dry. Clean up any water that spills on the floor as soon as it happens. Remove soap buildup in the tub or shower regularly. Attach bath mats securely with double-sided non-slip rug tape. Do not have throw rugs and other things on the  floor that can make you trip. What can I do in the bedroom? Use night lights. Make sure that you have a light by your bed that is easy to reach. Do not use any sheets or blankets that are too big for your bed. They should not hang down onto the floor. Have a firm chair that has side arms. You can use this for support while you get dressed. Do not have throw rugs and other things on the floor that can make you trip. What can I do in the kitchen? Clean up any spills right away. Avoid walking on wet floors. Keep items that you use a lot in easy-to-reach places. If you need to reach something above you, use a strong step stool that has a grab bar. Keep electrical cords out of the way. Do not use floor polish or wax that makes floors slippery. If you must use wax, use non-skid floor wax. Do not have throw rugs and other things on the floor that can make you trip. What can I do with my stairs? Do not leave any items on the stairs. Make sure that there are handrails on both sides of the stairs and use them. Fix  handrails that are broken or loose. Make sure that handrails are as long as the stairways. Check any carpeting to make sure that it is firmly attached to the stairs. Fix any carpet that is loose or worn. Avoid having throw rugs at the top or bottom of the stairs. If you do have throw rugs, attach them to the floor with carpet tape. Make sure that you have a light switch at the top of the stairs and the bottom of the stairs. If you do not have them, ask someone to add them for you. What else can I do to help prevent falls? Wear shoes that: Do not have high heels. Have rubber bottoms. Are comfortable and fit you well. Are closed at the toe. Do not wear sandals. If you use a stepladder: Make sure that it is fully opened. Do not climb a closed stepladder. Make sure that both sides of the stepladder are locked into place. Ask someone to hold it for you, if possible. Clearly mark and make  sure that you can see: Any grab bars or handrails. First and last steps. Where the edge of each step is. Use tools that help you move around (mobility aids) if they are needed. These include: Canes. Walkers. Scooters. Crutches. Turn on the lights when you go into a dark area. Replace any light bulbs as soon as they burn out. Set up your furniture so you have a clear path. Avoid moving your furniture around. If any of your floors are uneven, fix them. If there are any pets around you, be aware of where they are. Review your medicines with your doctor. Some medicines can make you feel dizzy. This can increase your chance of falling. Ask your doctor what other things that you can do to help prevent falls. This information is not intended to replace advice given to you by your health care provider. Make sure you discuss any questions you have with your health care provider. Document Released: 02/22/2009 Document Revised: 10/04/2015 Document Reviewed: 06/02/2014 Elsevier Interactive Patient Education  2017 [employer].

## 2022-08-22 NOTE — Progress Notes (Signed)
Subjective:  Pt completed ADLs, fall risk, and SDOH during e-check in on 08/15/22.  Answers verified with pt.    Angela Ray is a 72 y.o. female who presents for Medicare Annual (Subsequent) preventive examination.  I connected with  Angela Ray on 08/22/22 by a audio enabled telemedicine application and verified that I am speaking with the correct person using two identifiers.  Patient Location: Home  Provider Location: Office/Clinic  I discussed the limitations of evaluation and management by telemedicine. The patient expressed understanding and agreed to proceed.   Review of Systems     Cardiac Risk Factors include: advanced age (>55men, >32 women)     Objective:    There were no vitals filed for this visit. There is no height or weight on file to calculate BMI.     08/22/2022    3:44 PM 06/06/2022    2:56 PM 08/20/2021    9:37 AM 12/16/2019    9:38 AM 10/23/2018    9:01 PM 10/09/2017    6:47 AM 04/04/2015    8:58 AM  Advanced Directives  Does Patient Have a Medical Advance Directive? Yes Yes Yes Yes Yes No Yes  Type of Estate agent of [place];Living will;Out of facility DNR (pink MOST or yellow form)  Healthcare Power of [place];Living will Healthcare Power of [place];Living will Healthcare Power of [place];Living will  Healthcare Power of [place];Living will  Does patient want to make changes to medical advance directive? No - Patient declined   No - Patient declined No - Patient declined    Copy of Healthcare Power of Attorney in Chart? No - copy requested  Yes - validated most recent copy scanned in chart (See row information) No - copy requested No - copy requested    Would patient like information on creating a medical advance directive?     No - Patient declined      Current Medications (verified) Outpatient Encounter Medications as of 08/22/2022  Medication Sig   Azelaic Acid 15 % cream Apply 1 application topically every  morning.   busPIRone (BUSPAR) 30 MG tablet Take 1 tablet by mouth 2 times a day.   cetirizine (ZYRTEC) 10 MG tablet Take 10 mg by mouth daily as needed for allergies.    clonazePAM (KLONOPIN) 1 MG tablet Take 1 tablet by mouth 3 (three) times daily.   Cyanocobalamin (B-12) 2500 MCG TABS Take by mouth daily.   cyclobenzaprine (FLEXERIL) 10 MG tablet TAKE 1 TABLET BY MOUTH TWICE A DAY AS NEEDED FOR MUSCLE SPASMS   ESTRACE VAGINAL 0.1 MG/GM vaginal cream Apply 1 application topically daily. Apply small amount externally to vaginal area daily   fluticasone (CUTIVATE) 0.05 % cream Apply 1 application topically 2 (two) times daily.   fluticasone (FLONASE) 50 MCG/ACT nasal spray Place 2 sprays into both nostrils at bedtime as needed for allergies.   gabapentin (NEURONTIN) 300 MG capsule as needed.   ibuprofen (ADVIL,MOTRIN) 600 MG tablet TAKE 1 TABLET (600 MG TOTAL) BY MOUTH EVERY 8 (EIGHT) HOURS AS NEEDED (PAIN).   ipratropium (ATROVENT) 0.06 % nasal spray PLACE 2 SPRAYS INTO BOTH NOSTRILS 4 TIMES DAILY.   lidocaine (XYLOCAINE) 5 % ointment Apply daily as needed   Lifitegrast (XIIDRA) 5 % SOLN Apply 1 drop to eye daily.   linaclotide (LINZESS) 72 MCG capsule Take 1 capsule (72 mcg total) by mouth daily before breakfast. (Patient not taking: Reported on 08/14/2022)   metroNIDAZOLE (METROCREAM) 0.75 % cream Apply topically daily  as needed.   Multiple Vitamins-Minerals (HAIR/SKIN/NAILS/BIOTIN PO) Take by mouth daily.   OVER THE COUNTER MEDICATION Take 1 capsule by mouth 3 (three) times daily. Hydro eye   PARoxetine (PAXIL) 20 MG tablet Take 20 mg by mouth 2 (two) times daily.   propranolol (INDERAL) 20 MG tablet Take 1 tablet (20 mg total) by mouth 2 (two) times daily.   traMADol (ULTRAM-ER) 100 MG 24 hr tablet Take 100 mg by mouth daily.   tretinoin (RETIN-A) 0.025 % cream Apply 1 application topically at bedtime.   trimethoprim (TRIMPEX) 100 MG tablet Take 1 tablet by mouth daily.   No  facility-administered encounter medications on file as of 08/22/2022.    Allergies (verified) Aciphex [rabeprazole sodium], Bupropion hcl, Cephalexin, Codeine, Esomeprazole magnesium, Sertraline hcl, Linzess [linaclotide], Prednisone, and Doxycycline hyclate   History: Past Medical History:  Diagnosis Date   Allergy 2002   Anxiety    Barrett's esophagus    in the past   Colon polyp    Depression    Dry eye syndrome of both eyes    Dupuytren's contracture of both hands 10/2016   sees Dr.Gramig--[facility]   Essential tremor 2016   legs, arm,lower jaw   Female bladder prolapse    Fibroid    Fibromyalgia    GERD (gastroesophageal reflux disease)    Heat exhaustion    Herniated disc, cervical    C3   Hiatal hernia    History of cardiovascular stress test    Lexiscan Myoview 7/16:  EF 65%, inferolateral defect (prob artifactual), no ischemia   History of recurrent UTIs    Hyperlipidemia    IBS (irritable bowel syndrome)    constipation   Leg cramps    Low back pain    Lumbar herniated disc    L4,L5   Osteoarthritis    Osteopenia    Osteoporosis    Osteopenia per pt   Rheumatoid arthritis, adult 2016   both feet   Skin cancer    Spinal stenosis    lumbar   Tremors of nervous system    Vertigo    Past Surgical History:  Procedure Laterality Date   ABDOMINAL HYSTERECTOMY  2003   TAH still has ovaries--Dr. Edward Jolly   ANAL RECTAL MANOMETRY [name] 12/23/2019   Procedure: ANO RECTAL MANOMETRY;  Surgeon: Napoleon Form, MD;  Location: [facility];  Service: Endoscopy;  Laterality: [name];   CERVICAL LAMINECTOMY  2002   C5/C6   CHOLECYSTECTOMY  2008   fractured ankle Left 10/2014   ROTATOR CUFF REPAIR Right 2011   Family History  Problem Relation Age of Onset   Coronary artery disease Father 36       CABG 2002, followd by Dr, Myrtis Ser   Prostate cancer Father    Bladder Cancer Father    Skin cancer Father    Diabetes Father 77       DM type 2   Atrial fibrillation  Mother    Multiple myeloma Mother    Skin cancer Mother    Coronary artery disease Mother    Irritable bowel syndrome Mother    Diabetes Maternal Grandmother    Hypertension Maternal Grandmother    Diabetes Maternal Grandfather    Hypertension Maternal Grandfather    Osteoarthritis Paternal Grandmother    Cancer Maternal Aunt        bone cancer   Colon cancer Neg Hx    Esophageal cancer Neg Hx    Stomach cancer Neg Hx  Rectal cancer Neg Hx    Social History   Socioeconomic History   Marital status: Married    Spouse name: Not on file   Number of children: 0   Years of education: 12   Highest education level: Not on file  Occupational History   Occupation: Retired    Associate Professor: RETIRED  Tobacco Use   Smoking status: Never   Smokeless tobacco: Never  Vaping Use   Vaping Use: Never used  Substance and Sexual Activity   Alcohol use: No    Alcohol/week: 0.0 standard drinks of alcohol    Comment: occ glass of wine   Drug use: No   Sexual activity: Not Currently    Partners: Male    Birth control/protection: Surgical    Comment: TAH still has ovaries  Other Topics Concern   Not on file  Social History Narrative   Lives at home with her husband.   Right-handed.   Rarely uses caffeine.   Social Determinants of Health   Financial Resource Strain: Low Risk  (08/15/2022)   Overall Financial Resource Strain (CARDIA)    Difficulty of Paying Living Expenses: Not hard at all  Food Insecurity: No Food Insecurity (08/15/2022)   Hunger Vital Sign    Worried About Running Out of Food in the Last Year: Never true    Ran Out of Food in the Last Year: Never true  Transportation Needs: No Transportation Needs (08/15/2022)   PRAPARE - Administrator, Civil Service (Medical): No    Lack of Transportation (Non-Medical): No  Physical Activity: Insufficiently Active (08/15/2022)   Exercise Vital Sign    Days of Exercise per Week: 1 day    Minutes of Exercise per Session: 30  min  Stress: Stress Concern Present (08/15/2022)   [employer] of Occupational Health - Occupational Stress Questionnaire    Feeling of Stress : Rather much  Social Connections: Unknown (08/15/2022)   Social Connection and Isolation Panel [NHANES]    Frequency of Communication with Friends and Family: More than three times a week    Frequency of Social Gatherings with Friends and Family: Once a week    Attends Religious Services: Not on Marketing executive or Organizations: Yes    Attends Engineer, structural: More than 4 times per year    Marital Status: Married    Tobacco Counseling Counseling given: Not Answered   Clinical Intake:  Pre-visit preparation completed: Yes  Pain : No/denies pain  Nutritional Risks: None Diabetes: No  How often do you need to have someone help you when you read instructions, pamphlets, or other written materials from your doctor or pharmacy?: 1 - Never   Activities of Daily Living    08/15/2022    4:00 PM  In your present state of health, do you have any difficulty performing the following activities:  Hearing? 0  Vision? 0  Difficulty concentrating or making decisions? 0  Walking or climbing stairs? 1  Dressing or bathing? 0  Doing errands, shopping? 0  Preparing Food and eating ? N  Using the Toilet? N  In the past six months, have you accidently leaked urine? Y  Do you have problems with loss of bowel control? N  Managing your Medications? N  Managing your Finances? N  Housekeeping or managing your Housekeeping? N    Patient Care Team: Copland, Gwenlyn Found, MD as PCP - General (Family Medicine) Dominica Severin, MD as Consulting Physician (Orthopedic  Surgery)  Indicate any recent Medical Services you may have received from other than [facility] providers in the past year (date may be approximate).     Assessment:   This is a routine wellness examination for Angela Ray.  Hearing/Vision screen No results  found.  Dietary issues and exercise activities discussed: Current Exercise Habits: Home exercise routine, Type of exercise: walking;Other - see comments (works with trainer to strengthen legs), Time (Minutes): 30, Frequency (Times/Week): 2, Weekly Exercise (Minutes/Week): 60, Intensity: Mild, Exercise limited by: orthopedic condition(s)   Goals Addressed   None    Depression Screen    08/22/2022    4:01 PM 01/28/2022    8:43 AM 01/15/2022    3:56 PM 08/20/2021    9:45 AM 05/20/2021    9:23 AM  PHQ 2/9 Scores  PHQ - 2 Score 1 0 0 1 0    Fall Risk    08/15/2022    4:00 PM 01/28/2022    8:43 AM 01/15/2022    3:56 PM 08/20/2021    9:40 AM  Fall Risk   Falls in the past year? 0 0 0 1  Number falls in past yr: 0 0 0 1  Injury with Fall? 0 0 0 0  Risk for fall due to : No Fall Risks No Fall Risks No Fall Risks History of fall(s)  Follow up Falls evaluation completed Falls evaluation completed Falls evaluation completed Falls prevention discussed    FALL RISK PREVENTION PERTAINING TO THE HOME:  Any stairs in or around the home? Yes  If so, are there any without handrails? No  Home free of loose throw rugs in walkways, pet beds, electrical cords, etc? Yes  Adequate lighting in your home to reduce risk of falls? Yes   ASSISTIVE DEVICES UTILIZED TO PREVENT FALLS:  Life alert? Yes  Use of a cane, walker or w/c? No  Grab bars in the bathroom? Yes  Shower chair or bench in shower? Yes  Elevated toilet seat or a handicapped toilet? Yes   TIMED UP AND GO:  Was the test performed?  No, audio visit .    Cognitive Function:    08/22/2022    4:18 PM  MMSE - Mini Mental State Exam  Not completed: Unable to complete        Immunizations Immunization History  Administered Date(s) Administered   Influenza, High Dose Seasonal PF 03/01/2018, 02/15/2019, 02/07/2020, 02/26/2021, 02/06/2022   Influenza,inj,Quad PF,6+ Mos 02/10/2017   Influenza-Unspecified 03/12/2016   Moderna Sars-Covid-2  Vaccination 05/10/2019, 06/07/2019, 06/18/2019   Pneumococcal Conjugate-13 04/19/2018   Pneumococcal-Unspecified 05/12/2014   Tdap 05/12/2014   Zoster Recombinat (Shingrix) 11/08/2019, 01/16/2020   Zoster, Live 05/12/2014    TDAP status: Up to date  Flu Vaccine status: Up to date  Pneumococcal vaccine status: Due, Education has been provided regarding the importance of this vaccine. Advised may receive this vaccine at local pharmacy or Health Dept. Aware to provide a copy of the vaccination record if obtained from local pharmacy or Health Dept. Verbalized acceptance and understanding.  Covid-19 vaccine status: Information provided on how to obtain vaccines.   Qualifies for Shingles Vaccine? Yes   Zostavax completed Yes   Shingrix Completed?: Yes  Screening Tests Health Maintenance  Topic Date Due   Pneumonia Vaccine 70+ Years old (2 of 2 - PPSV23 or PCV20) 04/20/2019   COVID-19 Vaccine (4 - 2023-24 season) 01/10/2022   COLONOSCOPY (Pts 45-1yrs Insurance coverage will need to be confirmed)  06/06/2022   Medicare Annual  Wellness (AWV)  08/21/2022   INFLUENZA VACCINE  12/11/2022   MAMMOGRAM  12/13/2022   DTaP/Tdap/Td (2 - Td or Tdap) 05/12/2024   DEXA SCAN  Completed   Hepatitis C Screening  Completed   Zoster Vaccines- Shingrix  Completed   HPV VACCINES  Aged Out    Health Maintenance  Health Maintenance Due  Topic Date Due   Pneumonia Vaccine 96+ Years old (2 of 2 - PPSV23 or PCV20) 04/20/2019   COVID-19 Vaccine (4 - 2023-24 season) 01/10/2022   COLONOSCOPY (Pts 45-94yrs Insurance coverage will need to be confirmed)  06/06/2022   Medicare Annual Wellness (AWV)  08/21/2022    Colorectal cancer screening: Type of screening: Colonoscopy. Completed 06/07/19. Repeat every 3 years  Mammogram status: Completed 12/12/20. Repeat every year  Bone Density status: Completed 12/06/18. Results reflect: Bone density results: OSTEOPOROSIS. Repeat every 2 years.  Lung Cancer Screening:  (Low Dose CT Chest recommended if Age 69-80 years, 30 pack-year currently smoking OR have quit w/in 15years.) does not qualify.   Additional Screening:  Hepatitis C Screening: does qualify; Completed 02/22/16  Vision Screening: Recommended annual ophthalmology exams for early detection of glaucoma and other disorders of the eye. Is the patient up to date with their annual eye exam?  Yes  Who is the provider or what is the name of the office in which the patient attends annual eye exams? [facility]. If pt is not established with a provider, would they like to be referred to a provider to establish care? No .   Dental Screening: Recommended annual dental exams for proper oral hygiene  Community Resource Referral / Chronic Care Management: CRR required this visit?  No   CCM required this visit?  No      Plan:     I have personally reviewed and noted the following in the patient's chart:   Medical and social history Use of alcohol, tobacco or illicit drugs  Current medications and supplements including opioid prescriptions. Patient is currently taking opioid prescriptions. Information provided to patient regarding non-opioid alternatives. Patient advised to discuss non-opioid treatment plan with their provider. Functional ability and status Nutritional status Physical activity Advanced directives List of other physicians Hospitalizations, surgeries, and ER visits in previous 12 months Vitals Screenings to include cognitive, depression, and falls Referrals and appointments  In addition, I have reviewed and discussed with patient certain preventive protocols, quality metrics, and best practice recommendations. A written personalized care plan for preventive services as well as general preventive health recommendations were provided to patient.   Due to this being a telephonic visit, the after visit summary with patients personalized plan was offered to patient via  mail or my-chart. Patient would like to access on my-chart.  Donne Anon, [place]   08/22/2022   Nurse Notes: None

## 2022-09-02 ENCOUNTER — Ambulatory Visit: Payer: Medicare Other | Admitting: Internal Medicine

## 2022-09-09 ENCOUNTER — Ambulatory Visit: Payer: Medicare Other | Admitting: Nurse Practitioner

## 2022-09-10 DIAGNOSIS — F4310 Post-traumatic stress disorder, unspecified: Secondary | ICD-10-CM | POA: Diagnosis not present

## 2022-09-10 DIAGNOSIS — F4323 Adjustment disorder with mixed anxiety and depressed mood: Secondary | ICD-10-CM | POA: Diagnosis not present

## 2022-09-23 ENCOUNTER — Ambulatory Visit (INDEPENDENT_AMBULATORY_CARE_PROVIDER_SITE_OTHER): Payer: Medicare Other | Admitting: Internal Medicine

## 2022-09-23 ENCOUNTER — Other Ambulatory Visit: Payer: Self-pay

## 2022-09-23 ENCOUNTER — Encounter: Payer: Self-pay | Admitting: Internal Medicine

## 2022-09-23 VITALS — BP 99/66 | HR 58 | Temp 97.1°F | Wt 150.0 lb

## 2022-09-23 DIAGNOSIS — A310 Pulmonary mycobacterial infection: Secondary | ICD-10-CM

## 2022-09-23 NOTE — Patient Instructions (Signed)
Your ct seems to be at least stable or with slight improvement from initial  And again given no sign of chronic lung infection otherwise, will continue to monitor   See me in 1 year unless you start to develop sign of chronic pneumonia

## 2022-09-23 NOTE — Progress Notes (Signed)
[facility]    Patient Active Problem List   Diagnosis Date Noted   Dyssynergic defecation    Squamous cell carcinoma in situ (SCCIS) of skin 05/24/2018   External hemorrhoid 12/09/2016   Globus sensation 12/09/2016   Gas pain 12/09/2016   Abdominal pain, epigastric 12/09/2016   Chronic insomnia 12/04/2016   Essential tremor 12/04/2016   Fibromyalgia 11/25/2016   Lumbar stenosis 11/25/2016   Paresthesia 05/25/2015   Tremor 05/25/2015   IBS (irritable bowel syndrome) 02/07/2015   Recurrent UTI 02/07/2015   Constipation due to outlet dysfunction 12/21/2013   Incontinence of feces 12/16/2012   Chest pain 06/24/2011   Anxiety 07/11/2008   GERD 07/05/2008   GASTRITIS 07/05/2008   HIATAL HERNIA 07/05/2008   GALLSTONES 07/05/2008      HPI: Angela Ray is a 72 y.o. female history of RA untreated, dry eyes/mouth, fibromyalgia, gerd, postnasal gtt, referred by pcp here for concern of ntm lung disease in setting abnormal chest ct  Chart reviewed   Patient had a renal ct scan done recently for w/u kidney stone which showed pulm nodule. She subsquently has chest ct done that showed extensive nodular process  She has developed 6 months persistent stable mostly dry cough at times with hoarse voice. No hemoptysis. At times noticed green brown sputum. No b sx. No dyspnea or chest pain  She reports chronic post-nasal gtt and having gerd as well. She uses ipratropium without help for the nasal gtt  She has dry eyes dry mouths and also dx of RA not treated. She previously was given some injection, along with prednisone, and methotrexate for RA. She had stopped seeing rheum for years as she doesn't get along well with her previous docx. She complains of joint stiffness and pain in ankles/wrists  No spine pain, rash, dysphagia, n/v/diarrhea.  Outside of above sx she feels rather well   Social: Retired Engineer, mining; no pet  birds/poultry No chemical exposure Born/raised in [place]; has been in [place]; air travel stops by [place] No tb risk factor No smoking  -------------------- 04/24/22 id clinic assessment She had 3 sputum that probed mac; no sensitivity that I could see Also has been seeing pulm Repeat ct 02/2022 and 03/2022 stable reticular nodular lung changes She is coughing much less (almost none). She said she sold her old car where she smells musty odor and she thinks that might make her cough  She is singing in choir now and reengaging in strenuous exercise routine.  No f/c/nightsweat Good appetite No weight loss  Reviewed chest ct's with patient   ------------- 09/23/2022 id clinic f/u Reviewed 08/2022 chest ct -- which looks improved nodular changes No b sx No dyspnea/chest pain No worsening cough Again previous sputum cx x2 with mac but no susceptibility done We had decided against treatment for pulm mac at this time  No cough    Review of Systems: ROS All other ROS negative     Past Medical History:  Diagnosis Date   Allergy 2002   Anxiety    Barrett's esophagus    in the past   Colon polyp    Depression    Dry eye syndrome of both eyes    Dupuytren's contracture of both hands 10/2016   sees Dr.Gramig--[facility]   Essential tremor 2016   legs, arm,lower jaw   Female bladder prolapse    Fibroid    Fibromyalgia  GERD (gastroesophageal reflux disease)    Heat exhaustion    Herniated disc, cervical    C3   Hiatal hernia    History of cardiovascular stress test    Lexiscan Myoview 7/16:  EF 65%, inferolateral defect (prob artifactual), no ischemia   History of recurrent UTIs    Hyperlipidemia    IBS (irritable bowel syndrome)    constipation   Leg cramps    Low back pain    Lumbar herniated disc    L4,L5   Osteoarthritis    Osteopenia    Osteoporosis    Osteopenia per pt   Rheumatoid arthritis, adult (HCC) 2016   both feet   Skin cancer     Spinal stenosis    lumbar   Tremors of nervous system    Vertigo     Social History   Tobacco Use   Smoking status: Never   Smokeless tobacco: Never  Vaping Use   Vaping Use: Never used  Substance Use Topics   Alcohol use: No    Alcohol/week: 0.0 standard drinks of alcohol    Comment: occ glass of wine   Drug use: No    Family History  Problem Relation Age of Onset   Coronary artery disease Father 24       CABG 2002, followd by Dr, Myrtis Ser   Prostate cancer Father    Bladder Cancer Father    Skin cancer Father    Diabetes Father 34       DM type 2   Atrial fibrillation Mother    Multiple myeloma Mother    Skin cancer Mother    Coronary artery disease Mother    Irritable bowel syndrome Mother    Diabetes Maternal Grandmother    Hypertension Maternal Grandmother    Diabetes Maternal Grandfather    Hypertension Maternal Grandfather    Osteoarthritis Paternal Grandmother    Cancer Maternal Aunt        bone cancer   Colon cancer Neg Hx    Esophageal cancer Neg Hx    Stomach cancer Neg Hx    Rectal cancer Neg Hx     Allergies  Allergen Reactions   Aciphex [Rabeprazole Sodium] Other (See Comments)    REACTION: "FELT LIKE INDIGESTION"   Bupropion Hcl Other (See Comments)    REACTION: "DIDN'T MAKE ME FEEL RIGHT IN THE HEAD"   Cephalexin Nausea Only and Other (See Comments)    REACTION: "GAVE YEAST INFECTION"   Codeine Other (See Comments)    REACTION: "SEVERE NAUSEA, HEART PALPITATION, PASSING OUT, HEART ATTACK LIKE SYMPTOMS"   Esomeprazole Magnesium Other (See Comments)    Nexium=REACTION: " CAUSE TASTE BUDS TO FALL OUT"   Sertraline Hcl Other (See Comments)    REACTION: "DIDN'T MAKE ME FEEL RIGHT IN THE HEAD"   Linzess [Linaclotide] Diarrhea   Prednisone Other (See Comments)    Suicidal thoughts   Doxycycline Hyclate Nausea Only    OBJECTIVE: There were no vitals filed for this visit.  There is no height or weight on file to calculate BMI.   Physical  Exam General/constitutional: no distress, pleasant HEENT: Normocephalic, PER, Conj Clear, EOMI, Oropharynx clear Neck supple CV: rrr no mrg Lungs: clear to auscultation, normal respiratory effort Abd: Soft, Nontender Ext: no edema Skin: No Rash Neuro: nonfocal MSK: no peripheral joint swelling/tenderness/warmth; back spines nontender      Lab: Lab Results  Component Value Date   WBC 9.7 05/05/2022   HGB 14.5 05/05/2022   HCT 42.1  05/05/2022   MCV 93.6 05/05/2022   PLT 172 05/05/2022   Last metabolic panel Lab Results  Component Value Date   GLUCOSE 139 (H) 05/05/2022   NA 140 05/05/2022   K 4.5 05/05/2022   CL 104 05/05/2022   CO2 27 05/05/2022   BUN 16 05/05/2022   CREATININE 0.68 05/05/2022   GFRNONAA >60 05/05/2022   CALCIUM 9.0 05/05/2022   PROT 7.4 05/05/2022   ALBUMIN 4.3 05/05/2022   LABGLOB 2.3 03/06/2017   AGRATIO 2.0 03/06/2017   BILITOT 0.7 05/05/2022   ALKPHOS 39 05/05/2022   AST 28 05/05/2022   ALT 20 05/05/2022   ANIONGAP 9 05/05/2022    Microbiology:  Serology:  Imaging: Reviewed  9/11 chest ct with contrast Multiple cluster of irregular nodules are noted bilaterally in the lungs, with the largest being present in the right upper lobe. These findings most likely represent atypical infection such as mycobacterium. Follow-up unenhanced chest CT in 2-3 weeks is recommended to ensure resolution or stability and rule out neoplasm.   Large calcified granuloma seen in right lower lobe.   03/20/22 chest ct FINDINGS: No definite abnormality seen involving the visualized portions of the extracardiac vascular structures. Calcified right hilar lymph node is noted consistent with prior granulomatous disease. Visualized portion of upper abdomen is unremarkable. Large calcified granuloma is noted in superior segment of right lower lobe. Stable reticulonodular densities are noted in the right lung predominantly most consistent with chronic  mycobacterial infection as noted on prior CT scan. Visualized skeleton is unremarkable.   IMPRESSION: Stable chronic lung findings as described on recently performed CT scan. No definite acute abnormality is noted in the visualized extracardiac portions of the chest.   Assessment/plan: Problem List Items Addressed This Visit   None Visit Diagnoses     Pulmonary Mycobacterium avium complex (MAC) infection (HCC)    -  Primary       Reviewed ct and symptoms with her. Discuss a potential dx is chronic fungal/ntm infection. I do not suspect tb  For ntm this doesn't look classic lady's windemere syndrome to me in terms of anatomy. And symptomatically it is not impressive for ntm lung disease either  She has multiple confounders to consider r/o such as connective tissue related lung nodules/ILD and also gerd associated pulm parenchymal changes  Discuss with her the low success rate of treatment for ntm lung, high relapse, high abx toxicity so requiring a firm dx before treatment considered. Explain the process might take a few months with more testings and pulmonology evaluation as well    -refer to pulm -sputum afb x3 (avoid gargling with water/brusing prior to getting sample); fungal serology; quantiferon gold -will reevaluate in 3 months with plan to repeat chest ct -advise her to see rheumatology again due to systemic nature of RA and need for potential immunosuppressants  --------------- 12/14 assessment Sputum culture x3 all grew MAC from 9/25-9/27/2023; I wonder why there is no susceptibility testing At this time she feels even better than our first visit. She sold her car where she smells musty odor which she thinks contribute to cough. But now minimal coughing, singing in choir for at least 90 minutes at a time and reengaging in rigorous physical exercise without problems. Also chest ct repeat shows stability of reticular nodular changes  Will monitor her four months from  now. And if symptoms/imaging continue to be stable we can space out visit.   -------- 09/23/22 [facility] clinic assessment Reviewed ct together with patient  Clinically no sign of active pulm mac in terms of needing treatment Will continue monitoring -- f/u 1 year F/u pulm as indicated with their team     Follow-up: Return in about 1 year (around 09/23/2023).  Raymondo Band, MD [facility] 09/23/2022, 11:21 AM

## 2022-09-24 DIAGNOSIS — F4323 Adjustment disorder with mixed anxiety and depressed mood: Secondary | ICD-10-CM | POA: Diagnosis not present

## 2022-10-08 DIAGNOSIS — F4323 Adjustment disorder with mixed anxiety and depressed mood: Secondary | ICD-10-CM | POA: Diagnosis not present

## 2022-10-22 DIAGNOSIS — F4323 Adjustment disorder with mixed anxiety and depressed mood: Secondary | ICD-10-CM | POA: Diagnosis not present

## 2022-10-24 ENCOUNTER — Ambulatory Visit (INDEPENDENT_AMBULATORY_CARE_PROVIDER_SITE_OTHER): Payer: Medicare Other | Admitting: Family Medicine

## 2022-10-24 ENCOUNTER — Encounter: Payer: Self-pay | Admitting: Family Medicine

## 2022-10-24 ENCOUNTER — Other Ambulatory Visit (HOSPITAL_BASED_OUTPATIENT_CLINIC_OR_DEPARTMENT_OTHER): Payer: Self-pay

## 2022-10-24 VITALS — BP 102/54 | HR 66 | Temp 98.2°F | Ht 71.0 in | Wt 154.0 lb

## 2022-10-24 DIAGNOSIS — R21 Rash and other nonspecific skin eruption: Secondary | ICD-10-CM | POA: Diagnosis not present

## 2022-10-24 MED ORDER — HYDROCORTISONE 2.5 % EX CREA
TOPICAL_CREAM | Freq: Two times a day (BID) | CUTANEOUS | 0 refills | Status: DC
  Filled 2022-10-24: qty 30, 30d supply, fill #0

## 2022-10-24 MED ORDER — VALACYCLOVIR HCL 1 G PO TABS
1000.0000 mg | ORAL_TABLET | Freq: Three times a day (TID) | ORAL | 0 refills | Status: AC
  Filled 2022-10-24: qty 21, 7d supply, fill #0

## 2022-10-24 NOTE — Progress Notes (Signed)
Acute Office Visit  Subjective:     Patient ID: Angela Ray, female    DOB: April 05, 1951, 72 y.o.   MRN: [id number]  Chief Complaint  Patient presents with   Skin Problem    HPI Patient is in today for rash.    Rash started two day ago between breast - red, small bumps. States the area is itchy, burning, painful. Reports associated symptoms have spread to under both breasts, wrapping around to her back and neck. States she has not noticed the rash spread to her back or neck ,but just has the sensation. It is sensitive to light palpation. She has noticed some occasional headache and chills as well. No drainage, edema, GI symptoms, URI symptoms. Pain can get up to 9/10, burning at times. Denies any new foods, medications, soaps, detergents, skin care, etc .        ROS All review of systems negative except what is listed in the HPI      Objective:    BP (!) 102/54   Pulse 66   Temp 98.2 F (36.8 C) (Oral)   Ht 5\' 11"  (1.803 m)   Wt 154 lb (69.9 kg)   LMP 02/09/2002 (Approximate)   SpO2 98%   BMI 21.48 kg/m    Physical Exam Vitals reviewed.  Constitutional:      General: She is not in acute distress.    Appearance: Normal appearance. She is not ill-appearing.  Musculoskeletal:        General: Normal range of motion.  Skin:    Findings: Rash present.     Comments: Erythematous rash with some small vesicular clusters to midline between breasts  Neurological:     Mental Status: She is alert and oriented to person, place, and time.  Psychiatric:        Mood and Affect: Mood normal.        Behavior: Behavior normal.        Thought Content: Thought content normal.        Judgment: Judgment normal.     No results found for any visits on 10/24/22.      Assessment & Plan:   Problem List Items Addressed This Visit   None Visit Diagnoses     Rash    -  Primary Mildly vesicular rash with description consistent with shingles, but wide-spread/bilateral  sensations (itching, burning, pain) less common with shingles - no rash noted to back or neck. Will go ahead and treat with Valtrex and topical hydrocortisone as she cannot tolerate oral steroids. Patient aware of signs/symptoms requiring further/urgent evaluation.     Relevant Medications   valACYclovir (VALTREX) 1000 MG tablet   hydrocortisone 2.5 % cream       Meds ordered this encounter  Medications   valACYclovir (VALTREX) 1000 MG tablet    Sig: Take 1 tablet (1,000 mg total) by mouth 3 (three) times daily for 7 days.    Dispense:  21 tablet    Refill:  0    Order Specific Question:   Supervising Provider    Answer:   Angela Ray [[id number]]   hydrocortisone 2.5 % cream    Sig: Apply topically 2 (two) times daily.    Dispense:  30 g    Refill:  0    Order Specific Question:   Supervising Provider    Answer:   Angela Ray [[id number]]    Return if symptoms worsen or fail to improve.  Angela Dana, NP

## 2022-11-05 DIAGNOSIS — F4323 Adjustment disorder with mixed anxiety and depressed mood: Secondary | ICD-10-CM | POA: Diagnosis not present

## 2022-11-07 ENCOUNTER — Other Ambulatory Visit (HOSPITAL_BASED_OUTPATIENT_CLINIC_OR_DEPARTMENT_OTHER): Payer: Self-pay

## 2022-11-10 ENCOUNTER — Other Ambulatory Visit (HOSPITAL_BASED_OUTPATIENT_CLINIC_OR_DEPARTMENT_OTHER): Payer: Self-pay

## 2022-11-10 MED ORDER — PAROXETINE HCL 20 MG PO TABS
20.0000 mg | ORAL_TABLET | Freq: Two times a day (BID) | ORAL | 2 refills | Status: DC
  Filled 2022-11-10: qty 180, 90d supply, fill #0

## 2022-11-10 MED ORDER — BUSPIRONE HCL 30 MG PO TABS
30.0000 mg | ORAL_TABLET | Freq: Two times a day (BID) | ORAL | 4 refills | Status: DC
  Filled 2022-11-10: qty 180, 90d supply, fill #0
  Filled 2023-02-04: qty 180, 90d supply, fill #1

## 2022-11-11 ENCOUNTER — Other Ambulatory Visit: Payer: Self-pay

## 2022-11-17 ENCOUNTER — Ambulatory Visit (HOSPITAL_BASED_OUTPATIENT_CLINIC_OR_DEPARTMENT_OTHER)
Admission: RE | Admit: 2022-11-17 | Discharge: 2022-11-17 | Disposition: A | Discharge status: Home / Self Care | Payer: Medicare Other | Source: Ambulatory Visit | Attending: Pulmonary Disease | Admitting: Pulmonary Disease

## 2022-11-17 DIAGNOSIS — R918 Other nonspecific abnormal finding of lung field: Secondary | ICD-10-CM | POA: Insufficient documentation

## 2022-11-21 ENCOUNTER — Encounter (HOSPITAL_BASED_OUTPATIENT_CLINIC_OR_DEPARTMENT_OTHER): Payer: Self-pay | Admitting: Pulmonary Disease

## 2022-11-21 ENCOUNTER — Ambulatory Visit (INDEPENDENT_AMBULATORY_CARE_PROVIDER_SITE_OTHER): Payer: Medicare Other | Admitting: Pulmonary Disease

## 2022-11-21 VITALS — BP 102/64 | HR 60 | Temp 98.3°F | Ht 71.0 in | Wt 154.2 lb

## 2022-11-21 DIAGNOSIS — A310 Pulmonary mycobacterial infection: Secondary | ICD-10-CM | POA: Diagnosis not present

## 2022-11-21 DIAGNOSIS — A319 Mycobacterial infection, unspecified: Secondary | ICD-10-CM | POA: Diagnosis not present

## 2022-11-21 NOTE — Progress Notes (Signed)
Subjective:   PATIENT ID: Angela Ray GENDER: female DOB: 31-Jan-1951, MRN: [id number]  Chief Complaint  Patient presents with   Follow-up    Doing well.  Review CT scan results.    Reason for Visit: Follow up for pulmonary nodules and CT results   Initial Consult: [name] is a 72 year old female never smoker with RA not currently on immunosuppressants, fibromyalgia, tremors, anxiety, osteoarthritis, IBS with chronic constipation, hx recurrent UTI, hx skin cancer who presents for follow-up for abnormal CT, cough.   Initial consult She reports longstanding nonproductive cough for 8 months. Thought initially related to gardening. Sometimes productive but mostly chest congestion. Occurs at night. Denies wheezing. Has chronic nasal congestion. Tried atrovent nasal spray with improvement. At this point she reports her cough has resolved by >90%. Triggered by strong smells. Previously she noticed the car smelled of mildew. Now has a new car and symptoms has resolved. Changes filters monthly in there home. Has HEPA filter in home.  She was seen by PCP for kidney stones and recent E.coli UTI. Had renal scan completed which incidentally demonstrated 5 mm subpleural nodule in right lower lobe. Dedicated CT chest demonstrate multiple cluster of irregular nodules in the right upper lobe. Treated with azithromycin. Referred to Infectious Disease and had sputum culture collected. Fungal work-up also ordered and overall negative.  Of note, has hx of RA and previously on methotrexate, steroids, humira, enbrel in the past. >15 years since being on immunosuppressants.After switching to Dr. Launa Flight she stopped meds due to side effects including tremors and nerv damage.  07/10/22 Since last visit patient reports that she has been very sick. She reports that she has had the flu and had COVID. She report that she has had Emergency department and visit. Patient reports that since her illness  she has been improving and still has a cough but has improved. Patient has occasional thick white sputum. She also reports some brain fog after her COVID infection. Patient reports that she is now constipated, whereas during her infection she was having diarrhea. She does bring masks with her to show me which one is the best as she is about to go on a plane in March.   08/14/22 Cough has improved but has had some breakthrough with cough. Denies shortness of breath, night sweats or unintentional weight loss. Has restarted exercising including walking.   11/21/22 Since our last visit she has been doing well. Denies shortness of breath, cough or wheezing. Previously walking for exercise however not recently. Less brain fog and feeling better.  Social History: Currently living in facility Has dog. Sleeps in bed with them. Gets wiped down daily.  Past Medical History:  Diagnosis Date   Allergy 2002   Anxiety    Barrett's esophagus    in the past   Colon polyp    Depression    Dry eye syndrome of both eyes    Dupuytren's contracture of both hands 10/2016   sees Dr.Gramig--[facility]   Essential tremor 2016   legs, arm,lower jaw   Female bladder prolapse    Fibroid    Fibromyalgia    GERD (gastroesophageal reflux disease)    Heat exhaustion    Herniated disc, cervical    C3   Hiatal hernia    History of cardiovascular stress test    Lexiscan Myoview 7/16:  EF 65%, inferolateral defect (prob artifactual), no ischemia   History of recurrent UTIs    Hyperlipidemia  IBS (irritable bowel syndrome)    constipation   Leg cramps    Low back pain    Lumbar herniated disc    L4,L5   Osteoarthritis    Osteopenia    Osteoporosis    Osteopenia per pt   Rheumatoid arthritis, adult (HCC) 2016   both feet   Skin cancer    Spinal stenosis    lumbar   Tremors of nervous system    Vertigo      Family History  Problem Relation Age of Onset   Coronary artery disease Father 76        CABG 2002, followd by Dr, Myrtis Ser   Prostate cancer Father    Bladder Cancer Father    Skin cancer Father    Diabetes Father 18       DM type 2   Atrial fibrillation Mother    Multiple myeloma Mother    Skin cancer Mother    Coronary artery disease Mother    Irritable bowel syndrome Mother    Diabetes Maternal Grandmother    Hypertension Maternal Grandmother    Diabetes Maternal Grandfather    Hypertension Maternal Grandfather    Osteoarthritis Paternal Grandmother    Cancer Maternal Aunt        bone cancer   Colon cancer Neg Hx    Esophageal cancer Neg Hx    Stomach cancer Neg Hx    Rectal cancer Neg Hx      Social History   Occupational History   Occupation: Retired    Associate Professor: RETIRED  Tobacco Use   Smoking status: Never   Smokeless tobacco: Never  Vaping Use   Vaping status: Never Used  Substance and Sexual Activity   Alcohol use: No    Alcohol/week: 0.0 standard drinks of alcohol    Comment: occ glass of wine   Drug use: No   Sexual activity: Not Currently    Partners: Male    Birth control/protection: Surgical    Comment: TAH still has ovaries    Allergies  Allergen Reactions   Aciphex [Rabeprazole Sodium] Other (See Comments)    REACTION: "FELT LIKE INDIGESTION"   Bupropion Hcl Other (See Comments)    REACTION: "DIDN'T MAKE ME FEEL RIGHT IN THE HEAD"   Cephalexin Nausea Only and Other (See Comments)    REACTION: "GAVE YEAST INFECTION"   Codeine Other (See Comments)    REACTION: "SEVERE NAUSEA, HEART PALPITATION, PASSING OUT, HEART ATTACK LIKE SYMPTOMS"   Esomeprazole Magnesium Other (See Comments)    Nexium=REACTION: " CAUSE TASTE BUDS TO FALL OUT"   Sertraline Hcl Other (See Comments)    REACTION: "DIDN'T MAKE ME FEEL RIGHT IN THE HEAD"   Linzess [Linaclotide] Diarrhea   Prednisone Other (See Comments)    Suicidal thoughts   Doxycycline Hyclate Nausea Only   Methadone Palpitations    All opioids: Other Reaction(s): GI Intolerance, Respiratory  Distress     Outpatient Medications Prior to Visit  Medication Sig Dispense Refill   Azelaic Acid 15 % cream Apply 1 application topically every morning.     busPIRone (BUSPAR) 30 MG tablet Take 1 tablet by mouth 2 times a day. 180 tablet 4   busPIRone (BUSPAR) 30 MG tablet Take 1 tablet (30 mg total) by mouth 2 (two) times daily. 180 tablet 4   cetirizine (ZYRTEC) 10 MG tablet Take 10 mg by mouth daily as needed for allergies.      clonazePAM (KLONOPIN) 1 MG tablet Take 1 tablet by mouth  3 (three) times daily.     Cyanocobalamin (B-12) 2500 MCG TABS Take by mouth daily.     cyclobenzaprine (FLEXERIL) 10 MG tablet TAKE 1 TABLET BY MOUTH TWICE A DAY AS NEEDED FOR MUSCLE SPASMS 30 tablet 2   ESTRACE VAGINAL 0.1 MG/GM vaginal cream Apply 1 application topically daily. Apply small amount externally to vaginal area daily     fluticasone (CUTIVATE) 0.05 % cream Apply 1 application topically 2 (two) times daily.     fluticasone (FLONASE) 50 MCG/ACT nasal spray Place 2 sprays into both nostrils at bedtime as needed for allergies. 48 g 4   gabapentin (NEURONTIN) 300 MG capsule as needed.     hydrocortisone 2.5 % cream Apply topically 2 (two) times daily. 30 g 0   ibuprofen (ADVIL,MOTRIN) 600 MG tablet TAKE 1 TABLET (600 MG TOTAL) BY MOUTH EVERY 8 (EIGHT) HOURS AS NEEDED (PAIN). 40 tablet 1   ipratropium (ATROVENT) 0.06 % nasal spray PLACE 2 SPRAYS INTO BOTH NOSTRILS 4 TIMES DAILY. 15 mL 5   lidocaine (XYLOCAINE) 5 % ointment Apply daily as needed     Lifitegrast (XIIDRA) 5 % SOLN Apply 1 drop to eye daily.     metroNIDAZOLE (METROCREAM) 0.75 % cream Apply topically daily as needed. 45 g 1   Multiple Vitamins-Minerals (HAIR/SKIN/NAILS/BIOTIN PO) Take by mouth daily.     OVER THE COUNTER MEDICATION Take 1 capsule by mouth 3 (three) times daily. Hydro eye     PARoxetine (PAXIL) 20 MG tablet Take 20 mg by mouth 2 (two) times daily.  4   PARoxetine (PAXIL) 20 MG tablet Take 1 tablet (20 mg total) by mouth  2 (two) times daily. 180 tablet 2   propranolol (INDERAL) 20 MG tablet Take 1 tablet (20 mg total) by mouth 2 (two) times daily. 180 tablet 0   tretinoin (RETIN-A) 0.025 % cream Apply 1 application topically at bedtime.     trimethoprim (TRIMPEX) 100 MG tablet Take 1 tablet by mouth daily.     No facility-administered medications prior to visit.    Review of Systems  Constitutional:  Negative for chills, diaphoresis, fever, malaise/fatigue and weight loss.  HENT:  Negative for congestion.   Respiratory:  Negative for cough, hemoptysis, sputum production, shortness of breath and wheezing.   Cardiovascular:  Negative for chest pain, palpitations and leg swelling.     Objective:   Vitals:   11/21/22 1002  BP: 102/64  Pulse: 60  Temp: 98.3 F (36.8 C)  TempSrc: Oral  SpO2: 98%  Weight: 69.9 kg  Height: 5\' 11"  (1.803 m)   SpO2: 98 %  Physical Exam: General: Well-appearing, no acute distress HENT: [place], AT Eyes: EOMI, no scleral icterus Respiratory: Clear to auscultation bilaterally.  No crackles, wheezing or rales Cardiovascular: RRR, -M/R/G, no JVD Extremities:-Edema,-tenderness Neuro: AAO x4, CNII-XII grossly intact Psych: Normal mood, normal affect  Data Reviewed:  Imaging: CT Renal Stone Study 12/25/21 - Remarkable for RLL 5 mm nodule. CT Chest 01/20/22 - Nodular clusters bilaterally with largest cluster in the right upper lobe. Also present in RML and LLL and lingula Chest CT 03/03/2022: Bronchiectasis, peribronchials nodularity and mucoid impaction worst in upper lobes and right middle lobe, unchanged from previous. Mild subpleural and slightly basilar predominant subpleural reticular densities and ground-glass indicative of interstitial lung diease.   CXR 06/06/22: Hyperinflation with stable nodular areas in the right lung and a calcification in the superior segment of right lower lobe. No new consolidation or effusion. CT Chest HR 08/12/22 -  Compared to prior CT, improved  especially in lower lobes. Bronchiectasis similar with scattered nodules CT Chest HR 11/17/22 - Stable chronic nodules and bronchiectasis   PFT: None on file  Labs: Coccidioides ab - not detected Histoplasma ab - neg Cryptococcal ag - neg Quantiferon - neg     Assessment & Plan:   Discussion: 72 year old female never smoker with GERD, IBS, essential tremor who presents for follow-up. Reviewed CT scan in clinic together. Addressed questions and concerns. Overall stable nodular disease consistent with chronic MAC. Asymptomatic. Reviewed course of management if she were to become symptomatic including bronchoscopy to re-confirm MAC persistent and prolonged antibiotic course.  #Upper lung nodules  #Atypical CT findings/+MAC - asymptomatic -Reviewed CT with stable nodular disease and bronchiectasis -No indication for  treatment -Hold off on bronchoscopy with BAL unless patient develops persistent symptoms  -ORDER CT Chest without contrast in 1 year  #COVID long hauler with memory issues - improving -Encourage regular activity  -Encourage regular social activity  -Encourage patient to keep diary with places, locations, and names for any memory issues  Health Maintenance Immunization History  Administered Date(s) Administered   Influenza, High Dose Seasonal PF 03/01/2018, 02/15/2019, 02/07/2020, 02/26/2021, 02/06/2022   Influenza,inj,Quad PF,6+ Mos 02/10/2017   Influenza-Unspecified 03/12/2016   Moderna Sars-Covid-2 Vaccination 05/10/2019, 06/07/2019, 06/18/2019   Pneumococcal Conjugate-13 04/19/2018   Pneumococcal-Unspecified 05/12/2014   Tdap 05/12/2014   Zoster Recombinant(Shingrix) 11/08/2019, 01/16/2020   Zoster, Live 05/12/2014   CT Lung Screen - never smoker  Orders Placed This Encounter  Procedures   CT Chest Wo Contrast    Standing Status:   Future    Standing Expiration Date:   11/21/2023    Scheduling Instructions:     Schedule in 1 year (11/2023)    Order Specific  Question:   Preferred imaging location?    Answer:   [facility]   No orders of the defined types were placed in this encounter.   Return in about 1 year (around 11/21/2023) for after CT scan.  I have spent a total time of 31-minutes on the day of the appointment including chart review, data review, collecting history, coordinating care and discussing medical diagnosis and plan with the patient/family. Past medical history, allergies, medications were reviewed. Pertinent imaging, labs and tests included in this note have been reviewed and interpreted independently by me.  Coy Rochford Mechele Collin, MD [facility] 11/21/2022 7:55 PM

## 2022-11-21 NOTE — Patient Instructions (Addendum)
#  Upper lung nodules  #Atypical CT findings/+MAC - asymptomatic -Reviewed CT with stable nodular disease and bronchiectasis -No indication for  treatment -Hold off on bronchoscopy with BAL unless patient becomes with persistent symptoms  -ORDER CT Chest without contrast in 1 year  #COVID long hauler with memory issues - improving -Encourage regular activity  -Encourage regular social activity  -Encourage patient to keep diary with places, locations, and names for any memory issues

## 2022-11-24 ENCOUNTER — Ambulatory Visit: Payer: Medicare Other | Admitting: Family Medicine

## 2022-11-24 DIAGNOSIS — M25551 Pain in right hip: Secondary | ICD-10-CM | POA: Diagnosis not present

## 2022-11-24 DIAGNOSIS — S82092A Other fracture of left patella, initial encounter for closed fracture: Secondary | ICD-10-CM | POA: Diagnosis not present

## 2022-11-24 DIAGNOSIS — S81001A Unspecified open wound, right knee, initial encounter: Secondary | ICD-10-CM | POA: Diagnosis not present

## 2022-11-28 ENCOUNTER — Other Ambulatory Visit (HOSPITAL_BASED_OUTPATIENT_CLINIC_OR_DEPARTMENT_OTHER): Payer: Self-pay

## 2022-12-01 DIAGNOSIS — M25561 Pain in right knee: Secondary | ICD-10-CM | POA: Diagnosis not present

## 2022-12-01 DIAGNOSIS — M25562 Pain in left knee: Secondary | ICD-10-CM | POA: Diagnosis not present

## 2022-12-01 DIAGNOSIS — S8000XA Contusion of unspecified knee, initial encounter: Secondary | ICD-10-CM | POA: Diagnosis not present

## 2022-12-08 ENCOUNTER — Other Ambulatory Visit (HOSPITAL_BASED_OUTPATIENT_CLINIC_OR_DEPARTMENT_OTHER): Payer: Self-pay

## 2022-12-08 DIAGNOSIS — M25561 Pain in right knee: Secondary | ICD-10-CM | POA: Diagnosis not present

## 2022-12-08 DIAGNOSIS — R296 Repeated falls: Secondary | ICD-10-CM | POA: Diagnosis not present

## 2022-12-08 DIAGNOSIS — M6281 Muscle weakness (generalized): Secondary | ICD-10-CM | POA: Diagnosis not present

## 2022-12-08 DIAGNOSIS — R262 Difficulty in walking, not elsewhere classified: Secondary | ICD-10-CM | POA: Diagnosis not present

## 2022-12-08 DIAGNOSIS — M25562 Pain in left knee: Secondary | ICD-10-CM | POA: Diagnosis not present

## 2022-12-08 MED ORDER — TRIMETHOPRIM 100 MG PO TABS
100.0000 mg | ORAL_TABLET | Freq: Every day | ORAL | 3 refills | Status: DC
  Filled 2022-12-08: qty 90, 90d supply, fill #0

## 2022-12-10 ENCOUNTER — Other Ambulatory Visit (HOSPITAL_BASED_OUTPATIENT_CLINIC_OR_DEPARTMENT_OTHER): Payer: Self-pay

## 2022-12-15 DIAGNOSIS — R262 Difficulty in walking, not elsewhere classified: Secondary | ICD-10-CM | POA: Diagnosis not present

## 2022-12-15 DIAGNOSIS — R296 Repeated falls: Secondary | ICD-10-CM | POA: Diagnosis not present

## 2022-12-15 DIAGNOSIS — M25562 Pain in left knee: Secondary | ICD-10-CM | POA: Diagnosis not present

## 2022-12-15 DIAGNOSIS — M25561 Pain in right knee: Secondary | ICD-10-CM | POA: Diagnosis not present

## 2022-12-15 DIAGNOSIS — M6281 Muscle weakness (generalized): Secondary | ICD-10-CM | POA: Diagnosis not present

## 2022-12-24 DIAGNOSIS — M25561 Pain in right knee: Secondary | ICD-10-CM | POA: Diagnosis not present

## 2022-12-24 DIAGNOSIS — R296 Repeated falls: Secondary | ICD-10-CM | POA: Diagnosis not present

## 2022-12-24 DIAGNOSIS — R262 Difficulty in walking, not elsewhere classified: Secondary | ICD-10-CM | POA: Diagnosis not present

## 2022-12-24 DIAGNOSIS — M25562 Pain in left knee: Secondary | ICD-10-CM | POA: Diagnosis not present

## 2022-12-24 DIAGNOSIS — Z1231 Encounter for screening mammogram for malignant neoplasm of breast: Secondary | ICD-10-CM | POA: Diagnosis not present

## 2022-12-24 DIAGNOSIS — M6281 Muscle weakness (generalized): Secondary | ICD-10-CM | POA: Diagnosis not present

## 2022-12-26 DIAGNOSIS — R296 Repeated falls: Secondary | ICD-10-CM | POA: Diagnosis not present

## 2022-12-26 DIAGNOSIS — M6281 Muscle weakness (generalized): Secondary | ICD-10-CM | POA: Diagnosis not present

## 2022-12-26 DIAGNOSIS — M25561 Pain in right knee: Secondary | ICD-10-CM | POA: Diagnosis not present

## 2022-12-26 DIAGNOSIS — R262 Difficulty in walking, not elsewhere classified: Secondary | ICD-10-CM | POA: Diagnosis not present

## 2022-12-26 DIAGNOSIS — M25562 Pain in left knee: Secondary | ICD-10-CM | POA: Diagnosis not present

## 2022-12-29 ENCOUNTER — Other Ambulatory Visit: Payer: Self-pay | Admitting: Family Medicine

## 2022-12-29 DIAGNOSIS — M6281 Muscle weakness (generalized): Secondary | ICD-10-CM | POA: Diagnosis not present

## 2022-12-29 DIAGNOSIS — G250 Essential tremor: Secondary | ICD-10-CM

## 2022-12-29 DIAGNOSIS — M25561 Pain in right knee: Secondary | ICD-10-CM | POA: Diagnosis not present

## 2022-12-29 DIAGNOSIS — R296 Repeated falls: Secondary | ICD-10-CM | POA: Diagnosis not present

## 2022-12-29 DIAGNOSIS — M25562 Pain in left knee: Secondary | ICD-10-CM | POA: Diagnosis not present

## 2022-12-29 DIAGNOSIS — R262 Difficulty in walking, not elsewhere classified: Secondary | ICD-10-CM | POA: Diagnosis not present

## 2023-01-01 DIAGNOSIS — H524 Presbyopia: Secondary | ICD-10-CM | POA: Diagnosis not present

## 2023-01-01 DIAGNOSIS — H52223 Regular astigmatism, bilateral: Secondary | ICD-10-CM | POA: Diagnosis not present

## 2023-01-01 DIAGNOSIS — H5213 Myopia, bilateral: Secondary | ICD-10-CM | POA: Diagnosis not present

## 2023-01-01 DIAGNOSIS — H16223 Keratoconjunctivitis sicca, not specified as Sjogren's, bilateral: Secondary | ICD-10-CM | POA: Diagnosis not present

## 2023-01-01 DIAGNOSIS — H2513 Age-related nuclear cataract, bilateral: Secondary | ICD-10-CM | POA: Diagnosis not present

## 2023-01-01 DIAGNOSIS — H43811 Vitreous degeneration, right eye: Secondary | ICD-10-CM | POA: Diagnosis not present

## 2023-01-01 DIAGNOSIS — H40013 Open angle with borderline findings, low risk, bilateral: Secondary | ICD-10-CM | POA: Diagnosis not present

## 2023-01-02 DIAGNOSIS — M25562 Pain in left knee: Secondary | ICD-10-CM | POA: Diagnosis not present

## 2023-01-02 DIAGNOSIS — R262 Difficulty in walking, not elsewhere classified: Secondary | ICD-10-CM | POA: Diagnosis not present

## 2023-01-02 DIAGNOSIS — M6281 Muscle weakness (generalized): Secondary | ICD-10-CM | POA: Diagnosis not present

## 2023-01-02 DIAGNOSIS — R296 Repeated falls: Secondary | ICD-10-CM | POA: Diagnosis not present

## 2023-01-02 DIAGNOSIS — M25561 Pain in right knee: Secondary | ICD-10-CM | POA: Diagnosis not present

## 2023-01-09 DIAGNOSIS — R262 Difficulty in walking, not elsewhere classified: Secondary | ICD-10-CM | POA: Diagnosis not present

## 2023-01-09 DIAGNOSIS — M6281 Muscle weakness (generalized): Secondary | ICD-10-CM | POA: Diagnosis not present

## 2023-01-09 DIAGNOSIS — M25562 Pain in left knee: Secondary | ICD-10-CM | POA: Diagnosis not present

## 2023-01-09 DIAGNOSIS — R296 Repeated falls: Secondary | ICD-10-CM | POA: Diagnosis not present

## 2023-01-09 DIAGNOSIS — M25561 Pain in right knee: Secondary | ICD-10-CM | POA: Diagnosis not present

## 2023-01-23 ENCOUNTER — Other Ambulatory Visit: Payer: Self-pay | Admitting: Family Medicine

## 2023-01-23 DIAGNOSIS — G250 Essential tremor: Secondary | ICD-10-CM

## 2023-01-26 ENCOUNTER — Other Ambulatory Visit (HOSPITAL_BASED_OUTPATIENT_CLINIC_OR_DEPARTMENT_OTHER): Payer: Self-pay

## 2023-01-26 DIAGNOSIS — K582 Mixed irritable bowel syndrome: Secondary | ICD-10-CM | POA: Diagnosis not present

## 2023-01-26 DIAGNOSIS — N390 Urinary tract infection, site not specified: Secondary | ICD-10-CM | POA: Diagnosis not present

## 2023-01-26 DIAGNOSIS — R152 Fecal urgency: Secondary | ICD-10-CM | POA: Diagnosis not present

## 2023-01-26 DIAGNOSIS — N952 Postmenopausal atrophic vaginitis: Secondary | ICD-10-CM | POA: Diagnosis not present

## 2023-01-26 DIAGNOSIS — R159 Full incontinence of feces: Secondary | ICD-10-CM | POA: Diagnosis not present

## 2023-01-26 MED ORDER — CLONAZEPAM 1 MG PO TABS
1.0000 mg | ORAL_TABLET | Freq: Three times a day (TID) | ORAL | 0 refills | Status: DC
  Filled 2023-01-29: qty 270, 90d supply, fill #0

## 2023-01-27 ENCOUNTER — Other Ambulatory Visit (HOSPITAL_BASED_OUTPATIENT_CLINIC_OR_DEPARTMENT_OTHER): Payer: Self-pay

## 2023-01-29 ENCOUNTER — Other Ambulatory Visit (HOSPITAL_BASED_OUTPATIENT_CLINIC_OR_DEPARTMENT_OTHER): Payer: Self-pay

## 2023-02-03 ENCOUNTER — Other Ambulatory Visit (HOSPITAL_BASED_OUTPATIENT_CLINIC_OR_DEPARTMENT_OTHER): Payer: Self-pay

## 2023-02-03 ENCOUNTER — Encounter: Payer: Self-pay | Admitting: Family

## 2023-02-03 ENCOUNTER — Telehealth: Payer: Self-pay

## 2023-02-03 ENCOUNTER — Ambulatory Visit (HOSPITAL_BASED_OUTPATIENT_CLINIC_OR_DEPARTMENT_OTHER)
Admission: RE | Admit: 2023-02-03 | Discharge: 2023-02-03 | Disposition: A | Discharge status: Home / Self Care | Payer: Medicare Other | Source: Ambulatory Visit | Attending: Family | Admitting: Family

## 2023-02-03 ENCOUNTER — Ambulatory Visit: Payer: Medicare Other | Admitting: Family

## 2023-02-03 VITALS — BP 112/64 | HR 64 | Resp 18 | Ht 71.0 in | Wt 156.6 lb

## 2023-02-03 DIAGNOSIS — R079 Chest pain, unspecified: Secondary | ICD-10-CM | POA: Insufficient documentation

## 2023-02-03 DIAGNOSIS — J984 Other disorders of lung: Secondary | ICD-10-CM | POA: Diagnosis not present

## 2023-02-03 DIAGNOSIS — Z981 Arthrodesis status: Secondary | ICD-10-CM | POA: Diagnosis not present

## 2023-02-03 DIAGNOSIS — J479 Bronchiectasis, uncomplicated: Secondary | ICD-10-CM | POA: Diagnosis not present

## 2023-02-03 MED ORDER — PAROXETINE HCL 20 MG PO TABS
20.0000 mg | ORAL_TABLET | Freq: Two times a day (BID) | ORAL | 3 refills | Status: DC
  Filled 2023-02-03: qty 180, 90d supply, fill #0
  Filled 2023-05-05 (×2): qty 180, 90d supply, fill #1
  Filled 2023-07-16 – 2023-07-21 (×2): qty 180, 90d supply, fill #2
  Filled 2023-10-14: qty 180, 90d supply, fill #3

## 2023-02-03 NOTE — Telephone Encounter (Signed)
Seeing Vernona Rieger in office now.

## 2023-02-03 NOTE — Progress Notes (Signed)
Angela Ray is a 72 y.o. female with the following history as recorded in EpicCare:  Patient Active Problem List   Diagnosis Date Noted   Dyssynergic defecation    Squamous cell carcinoma in situ (SCCIS) of skin 05/24/2018   External hemorrhoid 12/09/2016   Globus sensation 12/09/2016   Gas pain 12/09/2016   Abdominal pain, epigastric 12/09/2016   Chronic insomnia 12/04/2016   Essential tremor 12/04/2016   Fibromyalgia 11/25/2016   Lumbar stenosis 11/25/2016   Paresthesia 05/25/2015   Tremor 05/25/2015   IBS (irritable bowel syndrome) 02/07/2015   Recurrent UTI 02/07/2015   Constipation due to outlet dysfunction 12/21/2013   Incontinence of feces 12/16/2012   Chest pain 06/24/2011   Anxiety 07/11/2008   GERD 07/05/2008   GASTRITIS 07/05/2008   HIATAL HERNIA 07/05/2008   GALLSTONES 07/05/2008    Current Outpatient Medications  Medication Sig Dispense Refill   Azelaic Acid 15 % cream Apply 1 application topically every morning.     busPIRone (BUSPAR) 30 MG tablet Take 1 tablet by mouth 2 times a day. 180 tablet 4   busPIRone (BUSPAR) 30 MG tablet Take 1 tablet (30 mg total) by mouth 2 (two) times daily. 180 tablet 4   cetirizine (ZYRTEC) 10 MG tablet Take 10 mg by mouth daily as needed for allergies.      clonazePAM (KLONOPIN) 1 MG tablet Take 1 tablet by mouth 3 (three) times daily.     clonazePAM (KLONOPIN) 1 MG tablet Take 1 tablet (1 mg total) by mouth 3 (three) times daily. 270 tablet 0   Cyanocobalamin (B-12) 2500 MCG TABS Take by mouth daily.     cyclobenzaprine (FLEXERIL) 10 MG tablet TAKE 1 TABLET BY MOUTH TWICE A DAY AS NEEDED FOR MUSCLE SPASMS 30 tablet 2   ESTRACE VAGINAL 0.1 MG/GM vaginal cream Apply 1 application topically daily. Apply small amount externally to vaginal area daily     fluticasone (CUTIVATE) 0.05 % cream Apply 1 application topically 2 (two) times daily.     fluticasone (FLONASE) 50 MCG/ACT nasal spray Place 2 sprays into both nostrils at  bedtime as needed for allergies. 48 g 4   gabapentin (NEURONTIN) 300 MG capsule as needed.     hydrocortisone 2.5 % cream Apply topically 2 (two) times daily. 30 g 0   ibuprofen (ADVIL,MOTRIN) 600 MG tablet TAKE 1 TABLET (600 MG TOTAL) BY MOUTH EVERY 8 (EIGHT) HOURS AS NEEDED (PAIN). 40 tablet 1   ipratropium (ATROVENT) 0.06 % nasal spray PLACE 2 SPRAYS INTO BOTH NOSTRILS 4 TIMES DAILY. 15 mL 5   lidocaine (XYLOCAINE) 5 % ointment Apply daily as needed     Lifitegrast (XIIDRA) 5 % SOLN Apply 1 drop to eye daily.     metroNIDAZOLE (METROCREAM) 0.75 % cream Apply topically daily as needed. 45 g 1   Multiple Vitamins-Minerals (HAIR/SKIN/NAILS/BIOTIN PO) Take by mouth daily.     OVER THE COUNTER MEDICATION Take 1 capsule by mouth 3 (three) times daily. Hydro eye     PARoxetine (PAXIL) 20 MG tablet Take 20 mg by mouth 2 (two) times daily.  4   PARoxetine (PAXIL) 20 MG tablet Take 1 tablet (20 mg total) by mouth 2 (two) times daily. 180 tablet 2   PARoxetine (PAXIL) 20 MG tablet Take 1 tablet (20 mg total) by mouth 2 (two) times daily. 180 tablet 3   propranolol (INDERAL) 20 MG tablet TAKE 1 TABLET BY MOUTH TWICE A DAY 180 tablet 0   tretinoin (RETIN-A) 0.025 %  cream Apply 1 application topically at bedtime.     trimethoprim (TRIMPEX) 100 MG tablet Take 1 tablet by mouth daily.     trimethoprim (TRIMPEX) 100 MG tablet Take 1 tablet (100 mg total) by mouth daily. 90 tablet 3   No current facility-administered medications for this visit.    Allergies: Aciphex [rabeprazole sodium], Bupropion hcl, Cephalexin, Codeine, Esomeprazole magnesium, Sertraline hcl, Linzess [linaclotide], Prednisone, Doxycycline hyclate, and Methadone  Past Medical History:  Diagnosis Date   Allergy 2002   Anxiety    Barrett's esophagus    in the past   Colon polyp    Depression    Dry eye syndrome of both eyes    Dupuytren's contracture of both hands 10/2016   sees Dr.Gramig--[facility]   Essential tremor 2016    legs, arm,lower jaw   Female bladder prolapse    Fibroid    Fibromyalgia    GERD (gastroesophageal reflux disease)    Heat exhaustion    Herniated disc, cervical    C3   Hiatal hernia    History of cardiovascular stress test    Lexiscan Myoview 7/16:  EF 65%, inferolateral defect (prob artifactual), no ischemia   History of recurrent UTIs    Hyperlipidemia    IBS (irritable bowel syndrome)    constipation   Leg cramps    Low back pain    Lumbar herniated disc    L4,L5   Osteoarthritis    Osteopenia    Osteoporosis    Osteopenia per pt   Rheumatoid arthritis, adult (HCC) 2016   both feet   Skin cancer    Spinal stenosis    lumbar   Tremors of nervous system    Vertigo     Past Surgical History:  Procedure Laterality Date   ABDOMINAL HYSTERECTOMY  2003   TAH still has ovaries--Dr. Edward Jolly   ANAL RECTAL MANOMETRY [name] 12/23/2019   Procedure: ANO RECTAL MANOMETRY;  Surgeon: Napoleon Form, MD;  Location: [facility];  Service: Endoscopy;  Laterality: [name];   CERVICAL LAMINECTOMY  2002   C5/C6   CHOLECYSTECTOMY  2008   fractured ankle Left 10/2014   ROTATOR CUFF REPAIR Right 2011    Family History  Problem Relation Age of Onset   Coronary artery disease Father 69       CABG 2002, followd by Dr, Myrtis Ser   Prostate cancer Father    Bladder Cancer Father    Skin cancer Father    Diabetes Father 17       DM type 2   Atrial fibrillation Mother    Multiple myeloma Mother    Skin cancer Mother    Coronary artery disease Mother    Irritable bowel syndrome Mother    Diabetes Maternal Grandmother    Hypertension Maternal Grandmother    Diabetes Maternal Grandfather    Hypertension Maternal Grandfather    Osteoarthritis Paternal Grandmother    Cancer Maternal Aunt        bone cancer   Colon cancer Neg Hx    Esophageal cancer Neg Hx    Stomach cancer Neg Hx    Rectal cancer Neg Hx     Social History   Tobacco Use   Smoking status: Never   Smokeless tobacco: Never   Substance Use Topics   Alcohol use: No    Alcohol/week: 0.0 standard drinks of alcohol    Comment: occ glass of wine    Subjective:  Notes had atypical episode of chest pain- started  this morning while lying in bed; notes that pain woke her up/ was localized over left side of chest; felt like spasm sensation across top of chest; admits that has been under increased stress recently- questioning if start of a panic attack; also notes has been having increased belching this afternoon and does feel better with belching;  No rash on chest- had shingles 3-4 months ago;   Objective:  Vitals:   02/03/23 1518  BP: 112/64  Pulse: 64  Resp: 18  SpO2: 99%  Weight: 156 lb 9.6 oz (71 kg)  Height: 5\' 11"  (1.803 m)    General: Well developed, well nourished, in no acute distress  Skin : Warm and dry.  Head: Normocephalic and atraumatic  Eyes: Sclera and conjunctiva clear; pupils round and reactive to light; extraocular movements intact  Ears: External normal; canals clear; tympanic membranes normal  Oropharynx: Pink, supple. No suspicious lesions  Neck: Supple without thyromegaly, adenopathy  Lungs: Respirations unlabored; clear to auscultation bilaterally without wheeze, rales, rhonchi  CVS exam: normal rate and regular rhythm.  Neurologic: Alert and oriented; speech intact; face symmetrical; moves all extremities well; CNII-XII intact without focal deficit   Assessment:  1. Chest pain, unspecified type     Plan:  Physical exam is reassuring; EKG shows sinus rhythm; ? Anxiety component vs heartburn; check CBC, CMP, d-dimer today; update CXR; patient will try taking OTC Tums; follow up worse, no better.   No follow-ups on file.  Orders Placed This Encounter  Procedures   DG Chest 2 View    Standing Status:   Future    Number of Occurrences:   1    Standing Expiration Date:   02/03/2024    Order Specific Question:   Reason for Exam (SYMPTOM  OR DIAGNOSIS REQUIRED)    Answer:   atypical  chest pain    Order Specific Question:   Preferred imaging location?    Answer:   [facility]   CBC with Differential/Platelet   Comp Met (CMET)   D-Dimer, Quantitative   EKG 12-Lead    Requested Prescriptions    No prescriptions requested or ordered in this encounter

## 2023-02-03 NOTE — Telephone Encounter (Signed)
Pt triaged to ED

## 2023-02-03 NOTE — Telephone Encounter (Signed)
Initial Comment Caller states that this morning in bed around 830am, she was having severe chest pain between her breasts that lasted around 15 minutes. It let up, and she felt achy. Now she has a pain in her chest that is radiating and feels like a pulled muscle. She has heart disease in her family. She also has tingling in her hands, arms, and legs. Her blood pressure has been running low at 93/60. Translation No Nurse Assessment Nurse: Shawnie Dapper, RN, Arline Asp Date/Time (Eastern Time): 02/03/2023 1:22:00 PM Confirm and document reason for call. If symptomatic, describe symptoms. ---Caller states that she has been having chest pain that started this morning and radiates from her breast to her collarbone. Caller states that she has tenderness to the area from 3-4/10. Caller states that pain is present even without movement. Does the patient have any new or worsening symptoms? ---Yes Will a triage be completed? ---Yes Related visit to physician within the last 2 weeks? ---No Does the PT have any chronic conditions? (i.e. diabetes, asthma, this includes High risk factors for pregnancy, etc.) ---Yes List chronic conditions. ---lung disease, fibromyalgia, OA , RA, tremors Is this a behavioral health or substance abuse call? ---No Guidelines Guideline Title Affirmed Question Affirmed Notes Nurse Date/Time (Eastern Time) Chest Pain [1] Chest pain lasts > 5 minutes AND [2] occurred in past Elana Alm 02/03/2023 1:25:52 PM PLEASE NOTE: All timestamps contained within this report are represented as [place] Standard Time. CONFIDENTIALTY NOTICE: This fax transmission is intended only for the addressee. It contains information that is legally privileged, confidential or otherwise protected from use or disclosure. If you are not the intended recipient, you are strictly prohibited from reviewing, disclosing, copying using or disseminating any of this information or taking any action in reliance  on or regarding this information. If you have received this fax in error, please notify us immediately by telephone so that we can arrange for its return to [place]. Phone: [phone], Toll-Free: [phone], Fax: [phone] Page: 2 of 3 Call Id: [id number] Guidelines Guideline Title Affirmed Question Affirmed Notes Nurse Date/Time Lamount Cohen Time) 3 days (72 hours) (Exception: Feels exactly the same as previously diagnosed heartburn and has accompanying sour taste in mouth.) Disp. Time Lamount Cohen Time) Disposition Final User 02/03/2023 1:18:09 PM Send to Urgent Queue Brooke Pace 02/03/2023 1:36:51 PM Go to ED Now (or PCP triage) Yes Shawnie Dapper, RN, Arline Asp Final Disposition 02/03/2023 1:36:51 PM Go to ED Now (or PCP triage) Yes Shawnie Dapper, RN, Ave Filter Disagree/Comply Comply Caller Understands Yes PreDisposition Call Doctor Care Advice Given Per Guideline GO TO ED/UCC NOW (OR PCP TRIAGE): * IF NO PCP (PRIMARY CARE PROVIDER) SECOND-LEVEL TRIAGE: You need to be seen within the next hour. Go to the ED/UCC at _____________ Hospital. Leave as soon as you can. CARE ADVICE given per Chest Pain (Adult) guideline. Comments User: Christy Gentles, RN Date/Time Lamount Cohen Time): 02/03/2023 1:28:37 PM 97% O2 and 69 HR User: Christy Gentles, RN Date/Time Lamount Cohen Time): 02/03/2023 1:29:50 PM 109/62 User: Christy Gentles, RN Date/Time (Eastern Time): 02/03/2023 1:38:02 PM Caller states that she would rather be seen by her PCP; per directives I told patient I will let office know and should receive a call back shortly. Caller agreed. User: Christy Gentles, RN Date/Time Lamount Cohen Time): 02/03/2023 1:38:33 PM Informed backline that patient requests to be seen in office instead of UC or ED. Office states that they will give the patient a call back. Referrals REFERRED TO PCP OFFICE

## 2023-02-04 LAB — CBC WITH DIFFERENTIAL/PLATELET
Basophils Absolute: 0.1 10*3/uL (ref 0.0–0.1)
Basophils Relative: 1.3 % (ref 0.0–3.0)
Eosinophils Absolute: 0.1 10*3/uL (ref 0.0–0.7)
Eosinophils Relative: 1.9 % (ref 0.0–5.0)
HCT: 39.9 % (ref 36.0–46.0)
Hemoglobin: 13.3 g/dL (ref 12.0–15.0)
Lymphocytes Relative: 28 % (ref 12.0–46.0)
Lymphs Abs: 1.5 10*3/uL (ref 0.7–4.0)
MCHC: 33.3 g/dL (ref 30.0–36.0)
MCV: 97.4 fl (ref 78.0–100.0)
Monocytes Absolute: 0.6 10*3/uL (ref 0.1–1.0)
Monocytes Relative: 11.7 % (ref 3.0–12.0)
Neutro Abs: 3.1 10*3/uL (ref 1.4–7.7)
Neutrophils Relative %: 57.1 % (ref 43.0–77.0)
Platelets: 224 10*3/uL (ref 150.0–400.0)
RBC: 4.1 Mil/uL (ref 3.87–5.11)
RDW: 13.1 % (ref 11.5–15.5)
WBC: 5.5 10*3/uL (ref 4.0–10.5)

## 2023-02-04 LAB — COMPREHENSIVE METABOLIC PANEL
ALT: 15 U/L (ref 0–35)
AST: 23 U/L (ref 0–37)
Albumin: 4.3 g/dL (ref 3.5–5.2)
Alkaline Phosphatase: 48 U/L (ref 39–117)
BUN: 12 mg/dL (ref 6–23)
CO2: 32 mEq/L (ref 19–32)
Calcium: 8.9 mg/dL (ref 8.4–10.5)
Chloride: 100 mEq/L (ref 96–112)
Creatinine, Ser: 0.73 mg/dL (ref 0.40–1.20)
GFR: 83.48 mL/min (ref >60.00)
Glucose, Bld: 95 mg/dL (ref 70–99)
Potassium: 3.9 mEq/L (ref 3.5–5.1)
Sodium: 141 mEq/L (ref 135–145)
Total Bilirubin: 0.4 mg/dL (ref 0.2–1.2)
Total Protein: 6.6 g/dL (ref 6.0–8.3)

## 2023-02-04 LAB — D-DIMER, QUANTITATIVE: D-Dimer, Quant: 0.22 mcg/mL FEU (ref <0.50)

## 2023-02-12 DIAGNOSIS — Z23 Encounter for immunization: Secondary | ICD-10-CM | POA: Diagnosis not present

## 2023-02-19 ENCOUNTER — Other Ambulatory Visit: Payer: Self-pay | Admitting: Pulmonary Disease

## 2023-02-19 NOTE — Patient Instructions (Addendum)
It was good to see you again today!  I am sorry you are having this pain- please let me know if not better in the next few days Remember both flexeril (cyclobenzaprine) and tramadol can make you drowsy- avoid using both at the same time unless necessary for pain control.
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
## 2023-02-23 ENCOUNTER — Other Ambulatory Visit (HOSPITAL_BASED_OUTPATIENT_CLINIC_OR_DEPARTMENT_OTHER): Payer: Self-pay

## 2023-02-23 ENCOUNTER — Ambulatory Visit (INDEPENDENT_AMBULATORY_CARE_PROVIDER_SITE_OTHER): Payer: Medicare Other | Admitting: Family Medicine

## 2023-02-23 DIAGNOSIS — J3089 Other allergic rhinitis: Secondary | ICD-10-CM

## 2023-02-23 DIAGNOSIS — M4722 Other spondylosis with radiculopathy, cervical region: Secondary | ICD-10-CM

## 2023-02-23 MED ORDER — CYCLOBENZAPRINE HCL 10 MG PO TABS
ORAL_TABLET | ORAL | 0 refills | Status: DC
Start: 2023-02-23 — End: 2023-12-18
  Filled 2023-02-23: qty 30, 15d supply, fill #0

## 2023-02-23 MED ORDER — TRAMADOL HCL 50 MG PO TABS
50.0000 mg | ORAL_TABLET | Freq: Three times a day (TID) | ORAL | 0 refills | Status: AC | PRN
Start: 2023-02-23 — End: ?
  Filled 2023-02-23: qty 20, 7d supply, fill #0

## 2023-02-23 MED ORDER — FLUTICASONE PROPIONATE 50 MCG/ACT NA SUSP
2.0000 | Freq: Every evening | NASAL | 4 refills | Status: DC | PRN
  Filled 2023-02-23: qty 48, 90d supply, fill #0
  Filled 2023-12-30: qty 48, 90d supply, fill #1

## 2023-02-23 MED ORDER — IBUPROFEN 600 MG PO TABS
600.0000 mg | ORAL_TABLET | Freq: Three times a day (TID) | ORAL | 1 refills | Status: AC | PRN
  Filled 2023-02-23: qty 40, 14d supply, fill #0

## 2023-02-25 ENCOUNTER — Other Ambulatory Visit (HOSPITAL_BASED_OUTPATIENT_CLINIC_OR_DEPARTMENT_OTHER): Payer: Self-pay

## 2023-02-25 MED ORDER — BUSPIRONE HCL 30 MG PO TABS
30.0000 mg | ORAL_TABLET | Freq: Two times a day (BID) | ORAL | 3 refills | Status: DC
  Filled 2023-02-25 – 2023-05-05 (×2): qty 180, 90d supply, fill #0
  Filled 2023-07-16 – 2023-07-21 (×2): qty 180, 90d supply, fill #1
  Filled 2023-10-27: qty 180, 90d supply, fill #2
  Filled 2023-12-15 – 2024-01-12 (×2): qty 180, 90d supply, fill #3

## 2023-03-11 ENCOUNTER — Other Ambulatory Visit (HOSPITAL_BASED_OUTPATIENT_CLINIC_OR_DEPARTMENT_OTHER): Payer: Self-pay

## 2023-04-26 ENCOUNTER — Other Ambulatory Visit (HOSPITAL_BASED_OUTPATIENT_CLINIC_OR_DEPARTMENT_OTHER): Payer: Self-pay

## 2023-04-27 ENCOUNTER — Other Ambulatory Visit (HOSPITAL_BASED_OUTPATIENT_CLINIC_OR_DEPARTMENT_OTHER): Payer: Self-pay

## 2023-04-27 ENCOUNTER — Telehealth: Payer: Self-pay | Admitting: Family Medicine

## 2023-04-27 ENCOUNTER — Other Ambulatory Visit: Payer: Self-pay

## 2023-04-27 DIAGNOSIS — G250 Essential tremor: Secondary | ICD-10-CM

## 2023-04-27 MED ORDER — CLONAZEPAM 1 MG PO TABS
1.0000 mg | ORAL_TABLET | Freq: Three times a day (TID) | ORAL | 0 refills | Status: DC
  Filled 2023-04-29 (×2): qty 270, 90d supply, fill #0

## 2023-04-27 MED ORDER — PROPRANOLOL HCL 20 MG PO TABS
20.0000 mg | ORAL_TABLET | Freq: Two times a day (BID) | ORAL | 0 refills | Status: DC
  Filled 2023-04-27: qty 180, 90d supply, fill #0

## 2023-04-27 NOTE — Telephone Encounter (Signed)
Pt would like to switch her refill to the pharmacy downstairs.   propranolol (INDERAL) 20 MG tablet

## 2023-04-27 NOTE — Telephone Encounter (Signed)
Refill sent.

## 2023-04-29 ENCOUNTER — Other Ambulatory Visit (HOSPITAL_BASED_OUTPATIENT_CLINIC_OR_DEPARTMENT_OTHER): Payer: Self-pay

## 2023-05-05 ENCOUNTER — Other Ambulatory Visit (HOSPITAL_BASED_OUTPATIENT_CLINIC_OR_DEPARTMENT_OTHER): Payer: Self-pay

## 2023-05-05 ENCOUNTER — Other Ambulatory Visit: Payer: Self-pay

## 2023-05-07 ENCOUNTER — Other Ambulatory Visit (HOSPITAL_BASED_OUTPATIENT_CLINIC_OR_DEPARTMENT_OTHER): Payer: Self-pay

## 2023-05-29 ENCOUNTER — Ambulatory Visit (INDEPENDENT_AMBULATORY_CARE_PROVIDER_SITE_OTHER): Payer: Medicare Other | Admitting: Family Medicine

## 2023-05-29 ENCOUNTER — Ambulatory Visit: Payer: Self-pay | Admitting: Family Medicine

## 2023-05-29 ENCOUNTER — Other Ambulatory Visit (HOSPITAL_BASED_OUTPATIENT_CLINIC_OR_DEPARTMENT_OTHER): Payer: Self-pay

## 2023-05-29 VITALS — BP 116/68 | HR 64 | Temp 98.0°F | Resp 16 | Ht 71.0 in | Wt 158.0 lb

## 2023-05-29 DIAGNOSIS — L308 Other specified dermatitis: Secondary | ICD-10-CM | POA: Diagnosis not present

## 2023-05-29 MED ORDER — TRIAMCINOLONE ACETONIDE 0.1 % EX CREA
1.0000 | TOPICAL_CREAM | Freq: Two times a day (BID) | CUTANEOUS | 0 refills | Status: DC
  Filled 2023-05-29: qty 30, 30d supply, fill #0

## 2023-05-29 NOTE — Telephone Encounter (Signed)
Copied from CRM [id number]. Topic: Clinical - Red Word Triage >> May 29, 2023 12:53 PM Denese Killings wrote: Red Word that prompted transfer to Nurse Triage: Patient has a rash on her throat that appeared a week ago. The exterior is swollen , itches, skin feels dry, and burns.   Chief Complaint: Rash, Symptoms: Tenderness, Erythema, Pruritis Frequency: Acute Pertinent Negatives: Patient denies Dyspnea, Chest Pain, or Acute Distress Like Symptoms.  Disposition: [] ED /[] Urgent Care (no appt availability in office) / [x] Appointment(In office/virtual)/ []  [facility]/ [] Home Care/ [] Refused Recommended Disposition /[] [facility]/ []  Follow-up with PCP Additional Notes: C. Yaeger is a 73 year old female triaged today for a rash starting in the middle of her throat extending  to the Right side of her Neck and collarbone. States it feels like hot needles. The patient denies any new exposures to potential allergens or contaminants. Has a history of autoimmune disorders. Reports this is the first time the rash has gotten this large or painful. The patient also reports mild edema to the area without occlusion to the airway. Denies drainage or a fever. In office appointment made for today.     Reason for Disposition  [1] Looks infected (spreading redness, pus) AND [2] no fever  Answer Assessment - Initial Assessment Questions 1. APPEARANCE of RASH: "Describe the rash."      Redness, like Raw Meat  2. LOCATION: "Where is the rash located?"      Middle of the throat to the right side of the neck  3. NUMBER: "How many spots are there?"      One  4. SIZE: "How big are the spots?" (Inches, centimeters or compare to size of a coin)      Large  5. ONSET: "When did the rash start?"      A week and a half  6. ITCHING: "Does the rash itch?" If Yes, ask: "How bad is the itch?"  (Scale 0-10; or none, mild, moderate, severe)     Yes, Moderate to Severe  7. PAIN: "Does the rash hurt?"  If Yes, ask: "How bad is the pain?"  (Scale 0-10; or none, mild, moderate, severe)    - NONE (0): no pain    - MILD (1-3): doesn't interfere with normal activities     - MODERATE (4-7): interferes with normal activities or awakens from sleep     - SEVERE (8-10): excruciating pain, unable to do any normal activities     8  8. OTHER SYMPTOMS: "Do you have any other symptoms?" (e.g., fever)     Swelling  9. PREGNANCY: "Is there any chance you are pregnant?" "When was your last menstrual period?"     No  Protocols used: Rash or Redness - Localized-A-AH

## 2023-05-29 NOTE — Progress Notes (Signed)
Chief Complaint  Patient presents with   Rash    Discuss Rash on neck    Angela Ray is a 72 y.o. female here for a skin complaint.  Duration:  1.5  weeks Location: neck Pruritic? Yes Painful? Yes Drainage? No New soaps/lotions/topicals/detergents? No Sick contacts? No Other associated symptoms: started small and spreading; no fevers; swelling over area Therapies tried thus far: Cetaphil cream and soap  Past Medical History:  Diagnosis Date   Allergy 2002   Anxiety    Barrett's esophagus    in the past   Colon polyp    Depression    Dry eye syndrome of both eyes    Dupuytren's contracture of both hands 10/2016   sees Dr.Gramig--[facility]   Essential tremor 2016   legs, arm,lower jaw   Female bladder prolapse    Fibroid    Fibromyalgia    GERD (gastroesophageal reflux disease)    Heat exhaustion    Herniated disc, cervical    C3   Hiatal hernia    History of cardiovascular stress test    Lexiscan Myoview 7/16:  EF 65%, inferolateral defect (prob artifactual), no ischemia   History of recurrent UTIs    Hyperlipidemia    IBS (irritable bowel syndrome)    constipation   Leg cramps    Low back pain    Lumbar herniated disc    L4,L5   Osteoarthritis    Osteopenia    Osteoporosis    Osteopenia per pt   Rheumatoid arthritis, adult (HCC) 2016   both feet   Skin cancer    Spinal stenosis    lumbar   Tremors of nervous system    Vertigo     BP 116/68   Pulse 64   Temp 98 F (36.7 C) (Oral)   Resp 16   Ht 5\' 11"  (1.803 m)   Wt 158 lb (71.7 kg)   LMP 02/09/2002 (Approximate)   SpO2 96%   BMI 22.04 kg/m  Gen: awake, alert, appearing stated age Lungs: No accessory muscle use Skin: see below. +scaling, mild ttp. No drainage, fluctuance, excoriation Psych: Age appropriate judgment and insight    Pruritic dermatitis - Plan: triamcinolone cream (KENALOG) 0.1 %  Zyrtec, topical Kenalog twice daily, avoid scented products, cool compresses,  avoid scratching, avoid scented products, daily emollient like Aquaphor.  Does not feel/look infected.  Consider an air humidifier. F/u prn. The patient voiced understanding and agreement to the plan.  Jilda Roche Len[place], DO 05/29/23 3:31 PM

## 2023-05-29 NOTE — Patient Instructions (Addendum)
Try not to scratch as this can make things worse. Avoid scented products while dealing with this. You may resume when the itchiness resolves. Cold/cool compresses can help.   Use Aquaphor when the area gets scaly. Also use it after applying the medicated cream.   I expect noticeable improvement by Monday. Send me a message if you are not improving.   Go on Zyrtec while dealing with this.   Let us know if you need anything.

## 2023-06-02 ENCOUNTER — Other Ambulatory Visit (HOSPITAL_BASED_OUTPATIENT_CLINIC_OR_DEPARTMENT_OTHER): Payer: Self-pay

## 2023-06-03 ENCOUNTER — Other Ambulatory Visit (HOSPITAL_BASED_OUTPATIENT_CLINIC_OR_DEPARTMENT_OTHER): Payer: Self-pay

## 2023-06-04 ENCOUNTER — Other Ambulatory Visit: Payer: Self-pay

## 2023-06-04 ENCOUNTER — Other Ambulatory Visit (HOSPITAL_BASED_OUTPATIENT_CLINIC_OR_DEPARTMENT_OTHER): Payer: Self-pay

## 2023-06-04 MED ORDER — ESTRADIOL 0.1 MG/GM VA CREA
1.0000 | TOPICAL_CREAM | Freq: Every day | VAGINAL | 11 refills | Status: AC
  Filled 2023-06-04: qty 42.5, 30d supply, fill #0
  Filled 2023-09-07: qty 42.5, 30d supply, fill #1
  Filled 2023-10-05: qty 42.5, 30d supply, fill #2
  Filled 2023-11-04: qty 42.5, 30d supply, fill #3
  Filled 2023-12-05: qty 42.5, 30d supply, fill #4
  Filled 2023-12-15 – 2023-12-31 (×2): qty 42.5, 30d supply, fill #5

## 2023-06-04 MED ORDER — TRIMETHOPRIM 100 MG PO TABS
100.0000 mg | ORAL_TABLET | Freq: Every day | ORAL | 3 refills | Status: AC
  Filled 2023-06-04: qty 90, 90d supply, fill #0
  Filled 2023-07-16 – 2023-08-20 (×2): qty 90, 90d supply, fill #1
  Filled 2023-12-02: qty 90, 90d supply, fill #2

## 2023-06-04 MED ORDER — XIIDRA 5 % OP SOLN
1.0000 [drp] | Freq: Two times a day (BID) | OPHTHALMIC | 0 refills | Status: AC
  Filled 2023-06-04: qty 180, 90d supply, fill #0

## 2023-06-04 MED FILL — Ipratropium Bromide Nasal Soln 0.06% (42 MCG/SPRAY): NASAL | 57 days supply | Qty: 45 | Fill #0 | Status: AC

## 2023-06-05 ENCOUNTER — Other Ambulatory Visit (HOSPITAL_BASED_OUTPATIENT_CLINIC_OR_DEPARTMENT_OTHER): Payer: Self-pay

## 2023-06-05 ENCOUNTER — Other Ambulatory Visit: Payer: Self-pay

## 2023-06-12 ENCOUNTER — Encounter: Payer: Self-pay | Admitting: Gastroenterology

## 2023-07-01 ENCOUNTER — Other Ambulatory Visit (HOSPITAL_BASED_OUTPATIENT_CLINIC_OR_DEPARTMENT_OTHER): Payer: Self-pay

## 2023-07-07 ENCOUNTER — Ambulatory Visit (AMBULATORY_SURGERY_CENTER): Payer: Medicare Other

## 2023-07-07 ENCOUNTER — Other Ambulatory Visit: Payer: Self-pay

## 2023-07-07 ENCOUNTER — Other Ambulatory Visit (HOSPITAL_BASED_OUTPATIENT_CLINIC_OR_DEPARTMENT_OTHER): Payer: Self-pay

## 2023-07-07 VITALS — Ht 71.0 in | Wt 150.0 lb

## 2023-07-07 DIAGNOSIS — Z860100 Personal history of colon polyps, unspecified: Secondary | ICD-10-CM

## 2023-07-07 MED ORDER — SUFLAVE 178.7 G PO SOLR
1.0000 | Freq: Once | ORAL | 0 refills | Status: AC
  Filled 2023-07-07: qty 2, 1d supply, fill #0

## 2023-07-07 MED ORDER — POLYETHYLENE GLYCOL 3350 17 GM/SCOOP PO POWD
0.5000 | Freq: Every day | ORAL | 0 refills | Status: DC
  Filled 2023-07-07: qty 238, 1d supply, fill #0

## 2023-07-07 MED ORDER — BISACODYL 5 MG PO TBEC
5.0000 mg | DELAYED_RELEASE_TABLET | Freq: Every day | ORAL | Status: AC | PRN
Start: 2023-07-07 — End: ?

## 2023-07-07 NOTE — Progress Notes (Signed)

## 2023-07-08 ENCOUNTER — Other Ambulatory Visit (HOSPITAL_BASED_OUTPATIENT_CLINIC_OR_DEPARTMENT_OTHER): Payer: Self-pay

## 2023-07-14 ENCOUNTER — Telehealth: Payer: Self-pay

## 2023-07-14 NOTE — Telephone Encounter (Signed)
 Patient called office questioning reason for appt in May. Per patient she does not feel like she needs to be seen and would like to go ahead and cancel appt. Denies any new concerns since last seen in office.  Will scheduled appt in July to see Pulmonology. Will call office back if needed for follow up. Juanita Laster, RMA

## 2023-07-15 ENCOUNTER — Other Ambulatory Visit: Payer: Self-pay | Admitting: Family Medicine

## 2023-07-15 ENCOUNTER — Other Ambulatory Visit (HOSPITAL_BASED_OUTPATIENT_CLINIC_OR_DEPARTMENT_OTHER): Payer: Self-pay

## 2023-07-15 DIAGNOSIS — G250 Essential tremor: Secondary | ICD-10-CM

## 2023-07-15 DIAGNOSIS — F4323 Adjustment disorder with mixed anxiety and depressed mood: Secondary | ICD-10-CM | POA: Diagnosis not present

## 2023-07-15 DIAGNOSIS — F4310 Post-traumatic stress disorder, unspecified: Secondary | ICD-10-CM | POA: Diagnosis not present

## 2023-07-16 ENCOUNTER — Other Ambulatory Visit (HOSPITAL_BASED_OUTPATIENT_CLINIC_OR_DEPARTMENT_OTHER): Payer: Self-pay

## 2023-07-16 MED ORDER — PROPRANOLOL HCL 20 MG PO TABS
20.0000 mg | ORAL_TABLET | Freq: Two times a day (BID) | ORAL | 0 refills | Status: DC
  Filled 2023-07-16: qty 180, 90d supply, fill #0

## 2023-07-17 ENCOUNTER — Telehealth: Payer: Self-pay | Admitting: Gastroenterology

## 2023-07-17 ENCOUNTER — Other Ambulatory Visit (HOSPITAL_BASED_OUTPATIENT_CLINIC_OR_DEPARTMENT_OTHER): Payer: Self-pay

## 2023-07-17 NOTE — Telephone Encounter (Signed)
 Pt has Suflave at home and wanted to review.instruction with pt. She wants to make sure she understands everything before she leaves to go on vacation and comes back to do prep and not spot on with what she needs to do. After review of instructions pt states she understands and does not at this time have any other questions.

## 2023-07-17 NOTE — Telephone Encounter (Signed)
 Inbound call from patient requesting a call to discuss concerns she has regarding upcoming colonoscopy. Please advise, thank you.

## 2023-07-22 ENCOUNTER — Other Ambulatory Visit (HOSPITAL_BASED_OUTPATIENT_CLINIC_OR_DEPARTMENT_OTHER): Payer: Self-pay

## 2023-07-26 ENCOUNTER — Other Ambulatory Visit (HOSPITAL_BASED_OUTPATIENT_CLINIC_OR_DEPARTMENT_OTHER): Payer: Self-pay

## 2023-07-26 ENCOUNTER — Other Ambulatory Visit: Payer: Self-pay | Admitting: Family Medicine

## 2023-07-26 DIAGNOSIS — G250 Essential tremor: Secondary | ICD-10-CM

## 2023-07-27 ENCOUNTER — Other Ambulatory Visit (HOSPITAL_BASED_OUTPATIENT_CLINIC_OR_DEPARTMENT_OTHER): Payer: Self-pay

## 2023-07-27 MED ORDER — CLONAZEPAM 1 MG PO TABS
1.0000 mg | ORAL_TABLET | Freq: Three times a day (TID) | ORAL | 0 refills | Status: DC
  Filled 2023-07-29: qty 270, 90d supply, fill #0

## 2023-07-29 ENCOUNTER — Other Ambulatory Visit (HOSPITAL_BASED_OUTPATIENT_CLINIC_OR_DEPARTMENT_OTHER): Payer: Self-pay

## 2023-07-31 ENCOUNTER — Encounter: Payer: Self-pay | Admitting: Gastroenterology

## 2023-07-31 ENCOUNTER — Other Ambulatory Visit (HOSPITAL_BASED_OUTPATIENT_CLINIC_OR_DEPARTMENT_OTHER): Payer: Self-pay

## 2023-07-31 ENCOUNTER — Ambulatory Visit: Payer: Medicare Other | Admitting: Gastroenterology

## 2023-07-31 VITALS — BP 125/71 | HR 62 | Temp 97.3°F | Resp 10 | Ht 71.0 in | Wt 150.0 lb

## 2023-07-31 DIAGNOSIS — Z1211 Encounter for screening for malignant neoplasm of colon: Secondary | ICD-10-CM | POA: Diagnosis not present

## 2023-07-31 DIAGNOSIS — Z9289 Personal history of other medical treatment: Secondary | ICD-10-CM | POA: Insufficient documentation

## 2023-07-31 DIAGNOSIS — D120 Benign neoplasm of cecum: Secondary | ICD-10-CM

## 2023-07-31 DIAGNOSIS — K648 Other hemorrhoids: Secondary | ICD-10-CM | POA: Diagnosis not present

## 2023-07-31 DIAGNOSIS — M797 Fibromyalgia: Secondary | ICD-10-CM | POA: Diagnosis not present

## 2023-07-31 DIAGNOSIS — K644 Residual hemorrhoidal skin tags: Secondary | ICD-10-CM | POA: Diagnosis not present

## 2023-07-31 DIAGNOSIS — Z860101 Personal history of adenomatous and serrated colon polyps: Secondary | ICD-10-CM | POA: Diagnosis not present

## 2023-07-31 DIAGNOSIS — Z860100 Personal history of colon polyps, unspecified: Secondary | ICD-10-CM

## 2023-07-31 DIAGNOSIS — K635 Polyp of colon: Secondary | ICD-10-CM | POA: Diagnosis not present

## 2023-07-31 DIAGNOSIS — F419 Anxiety disorder, unspecified: Secondary | ICD-10-CM | POA: Diagnosis not present

## 2023-07-31 MED ORDER — SODIUM CHLORIDE 0.9 % IV SOLN: 500.0000 mL | Freq: Once | INTRAVENOUS | Status: DC

## 2023-07-31 MED ORDER — PANTOPRAZOLE SODIUM 40 MG PO TBEC
40.0000 mg | DELAYED_RELEASE_TABLET | Freq: Every day | ORAL | 3 refills | Status: DC
  Filled 2023-07-31: qty 90, 90d supply, fill #0
  Filled 2023-10-27: qty 90, 90d supply, fill #1

## 2023-07-31 NOTE — Progress Notes (Signed)
 Pt's states no medical or surgical changes since previsit or office visit.  Pt reports bilateral foot swelling today.

## 2023-07-31 NOTE — Progress Notes (Signed)
 [facility] History and Physical   Primary Care Physician:  Copland, Gwenlyn Found, MD   Reason for Procedure:  History of adenomatous colon polyps  Plan:    Surveillance colonoscopy with possible interventions as needed     HPI: Angela Ray is a very pleasant 73 y.o. female here for surveillance colonoscopy. Denies any nausea, vomiting, abdominal pain, melena or bright red blood per rectum  The risks and benefits as well as alternatives of endoscopic procedure(s) have been discussed and reviewed. All questions answered. The patient agrees to proceed.    Past Medical History:  Diagnosis Date   Allergy 2002   Anxiety    Barrett's esophagus    in the past   Colon polyp    Depression    Dry eye syndrome of both eyes    Dupuytren's contracture of both hands 10/2016   sees Dr.Gramig--[facility]   Essential tremor 2016   legs, arm,lower jaw   Female bladder prolapse    Fibroid    Fibromyalgia    GERD (gastroesophageal reflux disease)    Heat exhaustion    Herniated disc, cervical    C3   Hiatal hernia    History of cardiovascular stress test    Lexiscan Myoview 7/16:  EF 65%, inferolateral defect (prob artifactual), no ischemia   History of recurrent UTIs    Hyperlipidemia    IBS (irritable bowel syndrome)    constipation   Leg cramps    Low back pain    Lumbar herniated disc    L4,L5   Osteoarthritis    Osteopenia    Osteoporosis    Osteopenia per pt   Rheumatoid arthritis, adult (HCC) 2016   both feet   Skin cancer    Spinal stenosis    lumbar   Tremors of nervous system    Vertigo     Past Surgical History:  Procedure Laterality Date   ABDOMINAL HYSTERECTOMY  2003   TAH still has ovaries--Dr. Edward Jolly   ANAL RECTAL MANOMETRY [name] 12/23/2019   Procedure: ANO RECTAL MANOMETRY;  Surgeon: Napoleon Form, MD;  Location: [facility];  Service: Endoscopy;  Laterality: [name];   CERVICAL LAMINECTOMY  2002   C5/C6   CHOLECYSTECTOMY  2008    fractured ankle Left 10/2014   ROTATOR CUFF REPAIR Right 2011    Prior to Admission medications   Medication Sig Start Date End Date Taking? Authorizing Provider  busPIRone (BUSPAR) 30 MG tablet Take 1 tablet (30 mg total) by mouth 2 (two) times daily. 02/25/23  Yes   Ca Phosphate-Cholecalciferol (CALTRATE GUMMY BITES) 250-10 MG-MCG CHEW Chew by mouth. 12/30/16  Yes [provider]  cetirizine (ZYRTEC) 10 MG tablet Take 10 mg by mouth daily as needed for allergies.    Yes [provider]  clonazePAM (KLONOPIN) 1 MG tablet Take 1 tablet (1 mg total) by mouth 3 (three) times daily. 07/28/23  Yes   Cyanocobalamin (B-12) 2500 MCG TABS Take by mouth daily.   Yes [provider]  ESTRACE VAGINAL 0.1 MG/GM vaginal cream Apply 1 application topically daily. Apply small amount externally to vaginal area daily 01/31/15  Yes [provider]  estradiol (ESTRACE) 0.1 MG/GM vaginal cream Apply a pea sized amount daily. 02/20/23  Yes   fluticasone (FLONASE) 50 MCG/ACT nasal spray Place 2 sprays into both nostrils at bedtime as needed for allergies. 02/23/23  Yes Copland, Gwenlyn Found, MD  ipratropium (ATROVENT) 0.06 % nasal spray Place 2 sprays into both nostrils 4 (  four) times daily. 02/21/23  Yes Luciano Cutter, MD  Lifitegrast Benay Spice) 5 % SOLN Apply 1 drop to eye daily.   Yes [provider]  Lifitegrast Benay Spice) 5 % SOLN Place 1 drop into both eyes 2 (two) times daily. 01/01/23  Yes Shon Millet, MD  OVER THE COUNTER MEDICATION Take 1 capsule by mouth 3 (three) times daily. Hydro eye   Yes [provider]  PARoxetine (PAXIL) 20 MG tablet Take 1 tablet (20 mg total) by mouth 2 (two) times daily. 07/30/22  Yes   PARoxetine (PAXIL) 20 MG tablet Take 1 tablet (20 mg total) by mouth 2 (two) times daily. 02/03/23  Yes   propranolol (INDERAL) 20 MG tablet Take 1 tablet (20 mg total) by mouth 2 (two) times daily. 07/16/23  Yes Copland, Gwenlyn Found, MD  trimethoprim  (TRIMPEX) 100 MG tablet Take 1 tablet (100 mg total) by mouth daily. 12/24/21  Yes Jamison Neighbor, MD  trimethoprim (TRIMPEX) 100 MG tablet Take 1 tablet (100 mg total) by mouth daily. 02/20/23  Yes   Azelaic Acid 15 % cream Apply 1 application topically every morning. 02/13/19   [provider]  Coenzyme Q10 60 MG CAPS Take by mouth. 12/30/16   [provider]  cyclobenzaprine (FLEXERIL) 10 MG tablet TAKE 1 TABLET BY MOUTH TWICE A DAY AS NEEDED FOR MUSCLE SPASMS 02/23/23   Copland, Gwenlyn Found, MD  gabapentin (NEURONTIN) 300 MG capsule as needed. Patient not taking: Reported on 07/31/2023 01/24/15   [provider]  ibuprofen (ADVIL) 600 MG tablet Take 1 tablet (600 mg total) by mouth every 8 (eight) hours as needed (pain). 02/23/23   Copland, Gwenlyn Found, MD  lidocaine (XYLOCAINE) 5 % ointment Apply daily as needed 12/30/16   [provider]  metroNIDAZOLE (METROCREAM) 0.75 % cream Apply topically daily as needed. 02/11/22   Copland, Gwenlyn Found, MD  Multiple Vitamins-Minerals (HAIR/SKIN/NAILS/BIOTIN PO) Take by mouth daily. Patient not taking: Reported on 07/31/2023    [provider]  PARoxetine (PAXIL) 20 MG tablet Take 20 mg by mouth 2 (two) times daily. 02/10/18   [provider]  polyethylene glycol powder (GLYCOLAX/MIRALAX) 17 GM/SCOOP powder Take as directed. 07/07/23   Napoleon Form, MD  traMADol (ULTRAM) 50 MG tablet Take 1 tablet (50 mg total) by mouth every 8 (eight) hours as needed. 02/23/23   Copland, Gwenlyn Found, MD  tretinoin (RETIN-A) 0.025 % cream Apply 1 application topically at bedtime. 01/29/19   [provider]  triamcinolone cream (KENALOG) 0.1 % Apply 1 Application topically 2 (two) times daily. 05/29/23   Sharlene Dory, DO    Current Outpatient Medications  Medication Sig Dispense Refill   busPIRone (BUSPAR) 30 MG tablet Take 1 tablet (30 mg total) by mouth 2 (two) times daily. 180 tablet 3   Ca  Phosphate-Cholecalciferol (CALTRATE GUMMY BITES) 250-10 MG-MCG CHEW Chew by mouth.     cetirizine (ZYRTEC) 10 MG tablet Take 10 mg by mouth daily as needed for allergies.      clonazePAM (KLONOPIN) 1 MG tablet Take 1 tablet (1 mg total) by mouth 3 (three) times daily. 270 tablet 0   Cyanocobalamin (B-12) 2500 MCG TABS Take by mouth daily.     ESTRACE VAGINAL 0.1 MG/GM vaginal cream Apply 1 application topically daily. Apply small amount externally to vaginal area daily     estradiol (ESTRACE) 0.1 MG/GM vaginal cream Apply a pea sized amount daily. 42.5 g 11   fluticasone (FLONASE) 50  MCG/ACT nasal spray Place 2 sprays into both nostrils at bedtime as needed for allergies. 48 g 4   ipratropium (ATROVENT) 0.06 % nasal spray Place 2 sprays into both nostrils 4 (four) times daily. 45 mL 1   Lifitegrast (XIIDRA) 5 % SOLN Apply 1 drop to eye daily.     Lifitegrast (XIIDRA) 5 % SOLN Place 1 drop into both eyes 2 (two) times daily. 180 each 0   OVER THE COUNTER MEDICATION Take 1 capsule by mouth 3 (three) times daily. Hydro eye     PARoxetine (PAXIL) 20 MG tablet Take 1 tablet (20 mg total) by mouth 2 (two) times daily. 180 tablet 2   PARoxetine (PAXIL) 20 MG tablet Take 1 tablet (20 mg total) by mouth 2 (two) times daily. 180 tablet 3   propranolol (INDERAL) 20 MG tablet Take 1 tablet (20 mg total) by mouth 2 (two) times daily. 180 tablet 0   trimethoprim (TRIMPEX) 100 MG tablet Take 1 tablet (100 mg total) by mouth daily. 90 tablet 3   trimethoprim (TRIMPEX) 100 MG tablet Take 1 tablet (100 mg total) by mouth daily. 90 tablet 3   Azelaic Acid 15 % cream Apply 1 application topically every morning.     Coenzyme Q10 60 MG CAPS Take by mouth.     cyclobenzaprine (FLEXERIL) 10 MG tablet TAKE 1 TABLET BY MOUTH TWICE A DAY AS NEEDED FOR MUSCLE SPASMS 30 tablet 0   gabapentin (NEURONTIN) 300 MG capsule as needed. (Patient not taking: Reported on 07/31/2023)     ibuprofen (ADVIL) 600 MG tablet Take 1 tablet  (600 mg total) by mouth every 8 (eight) hours as needed (pain). 40 tablet 1   lidocaine (XYLOCAINE) 5 % ointment Apply daily as needed     metroNIDAZOLE (METROCREAM) 0.75 % cream Apply topically daily as needed. 45 g 1   Multiple Vitamins-Minerals (HAIR/SKIN/NAILS/BIOTIN PO) Take by mouth daily. (Patient not taking: Reported on 07/31/2023)     PARoxetine (PAXIL) 20 MG tablet Take 20 mg by mouth 2 (two) times daily.  4   polyethylene glycol powder (GLYCOLAX/MIRALAX) 17 GM/SCOOP powder Take as directed. 238 g 0   traMADol (ULTRAM) 50 MG tablet Take 1 tablet (50 mg total) by mouth every 8 (eight) hours as needed. 20 tablet 0   tretinoin (RETIN-A) 0.025 % cream Apply 1 application topically at bedtime.     triamcinolone cream (KENALOG) 0.1 % Apply 1 Application topically 2 (two) times daily. 30 g 0   Current Facility-Administered Medications  Medication Dose Route Frequency Provider Last Rate Last Admin   0.9 %  sodium chloride infusion  500 mL Intravenous Once Kaesen Rodriguez, Eleonore Chiquito, MD       bisacodyl (DULCOLAX) EC tablet 5 mg  5 mg Oral Daily PRN Napoleon Form, MD        Allergies as of 07/31/2023 - Review Complete 07/31/2023  Allergen Reaction Noted   Aciphex [rabeprazole sodium] Other (See Comments) 06/24/2011   Bupropion hcl Other (See Comments) 05/19/2006   Cephalexin Nausea Only and Other (See Comments) 05/19/2006   Codeine Other (See Comments) 06/24/2011   Diclofenac Other (See Comments) 07/31/2023   Esomeprazole magnesium Other (See Comments) 06/24/2011   Methadone Palpitations 01/30/2015   Sertraline hcl Other (See Comments) 05/19/2006   Prednisone Other (See Comments) 05/20/2021   Doxycycline hyclate Nausea Only 05/19/2006   Linaclotide Diarrhea 11/07/2019    Family History  Problem Relation Age of Onset   Atrial fibrillation Mother    Multiple  myeloma Mother    Skin cancer Mother    Coronary artery disease Mother    Irritable bowel syndrome Mother    Coronary artery  disease Father 34       CABG 2002, followd by Dr, Myrtis Ser   Prostate cancer Father    Bladder Cancer Father    Skin cancer Father    Diabetes Father 51       DM type 2   Cancer Maternal Aunt        bone cancer   Diabetes Maternal Grandmother    Hypertension Maternal Grandmother    Diabetes Maternal Grandfather    Hypertension Maternal Grandfather    Osteoarthritis Paternal Grandmother    Colon cancer Neg Hx    Esophageal cancer Neg Hx    Stomach cancer Neg Hx    Rectal cancer Neg Hx    Colon polyps Neg Hx     Social History   Socioeconomic History   Marital status: Married    Spouse name: Not on file   Number of children: 0   Years of education: 12   Highest education level: 12th grade  Occupational History   Occupation: Retired    Associate Professor: RETIRED  Tobacco Use   Smoking status: Never   Smokeless tobacco: Never  Vaping Use   Vaping status: Never Used  Substance and Sexual Activity   Alcohol use: No    Alcohol/week: 0.0 standard drinks of alcohol    Comment: occ glass of wine   Drug use: No   Sexual activity: Not Currently    Partners: Male    Birth control/protection: Surgical    Comment: TAH still has ovaries  Other Topics Concern   Not on file  Social History Narrative   Lives at home with her husband.   Right-handed.   Rarely uses caffeine.   Social Drivers of Corporate investment banker Strain: Low Risk  (05/29/2023)   Overall Financial Resource Strain (CARDIA)    Difficulty of Paying Living Expenses: Not hard at all  Food Insecurity: No Food Insecurity (05/29/2023)   Hunger Vital Sign    Worried About Running Out of Food in the Last Year: Never true    Ran Out of Food in the Last Year: Never true  Transportation Needs: No Transportation Needs (05/29/2023)   PRAPARE - Administrator, Civil Service (Medical): No    Lack of Transportation (Non-Medical): No  Physical Activity: Insufficiently Active (05/29/2023)   Exercise Vital Sign    Days of  Exercise per Week: 3 days    Minutes of Exercise per Session: 20 min  Stress: No Stress Concern Present (05/29/2023)   [employer] of Occupational Health - Occupational Stress Questionnaire    Feeling of Stress : Only a little  Social Connections: Unknown (05/29/2023)   Social Connection and Isolation Panel [NHANES]    Frequency of Communication with Friends and Family: More than three times a week    Frequency of Social Gatherings with Friends and Family: Once a week    Attends Religious Services: Patient declined    Database administrator or Organizations: No    Attends Engineer, structural: More than 4 times per year    Marital Status: Married  Catering manager Violence: Unknown (11/24/2022)   Received from [facility]   HITS    Physically Hurt: Not on file    Insult or Talk Down To: Not on file    Threaten Physical Harm: Not on  file    Scream or Curse: Not on file    Review of Systems:  All other review of systems negative except as mentioned in the HPI.  Physical Exam: Vital signs in last 24 hours: BP (!) 94/54   Pulse 73   Temp (!) 97.3 F (36.3 C) (Temporal)   Ht 5\' 11"  (1.803 m)   Wt 150 lb (68 kg)   LMP 02/09/2002 (Approximate)   SpO2 98%   BMI 20.92 kg/m  General:   Alert, NAD Lungs:  Clear .   Heart:  Regular rate and rhythm Abdomen:  Soft, nontender and nondistended. Neuro/Psych:  Alert and cooperative. Normal mood and affect. A and O x 3  Reviewed labs, radiology imaging, old records and pertinent past GI work up  Patient is appropriate for planned procedure(s) and anesthesia in an ambulatory setting   K. Scherry Ran , MD [phone]

## 2023-07-31 NOTE — Progress Notes (Signed)
 Called to room to assist during endoscopic procedure.  Patient ID and intended procedure confirmed with present staff. Received instructions for my participation in the procedure from the performing physician.

## 2023-07-31 NOTE — Patient Instructions (Addendum)
 Resume previous diet and mediations. Awaiting pathology results. Repeat Colonoscopy in 5 years for surveillance based on pathology results. Schedule EGD at next available (08/10/23 arrive @2 :30) appointment for epigastric/RUQ abdominal pain in the setting of frequent NSAID use. Limit use of NSAID's Pantoprazole 40 mg daily, 30 minutes before breakfast Rx for 30 days with 3 refills   YOU HAD AN ENDOSCOPIC PROCEDURE TODAY AT THE [facility]:   Refer to the procedure report that was given to you for any specific questions about what was found during the examination.  If the procedure report does not answer your questions, please call your gastroenterologist to clarify.  If you requested that your care partner not be given the details of your procedure findings, then the procedure report has been included in a sealed envelope for you to review at your convenience later.  YOU SHOULD EXPECT: Some feelings of bloating in the abdomen. Passage of more gas than usual.  Walking can help get rid of the air that was put into your GI tract during the procedure and reduce the bloating. If you had a lower endoscopy (such as a colonoscopy or flexible sigmoidoscopy) you may notice spotting of blood in your stool or on the toilet paper. If you underwent a bowel prep for your procedure, you may not have a normal bowel movement for a few days.  Please Note:  You might notice some irritation and congestion in your nose or some drainage.  This is from the oxygen used during your procedure.  There is no need for concern and it should clear up in a day or so.  SYMPTOMS TO REPORT IMMEDIATELY:  Following lower endoscopy (colonoscopy or flexible sigmoidoscopy):  Excessive amounts of blood in the stool  Significant tenderness or worsening of abdominal pains  Swelling of the abdomen that is new, acute  Fever of 100F or higher  For urgent or emergent issues, a gastroenterologist can be reached at any hour by  calling [phone]. Do not use MyChart messaging for urgent concerns.    DIET:  We do recommend a small meal at first, but then you may proceed to your regular diet.  Drink plenty of fluids but you should avoid alcoholic beverages for 24 hours.  ACTIVITY:  You should plan to take it easy for the rest of today and you should NOT DRIVE or use heavy machinery until tomorrow (because of the sedation medicines used during the test).    FOLLOW UP: Our staff will call the number listed on your records the next business day following your procedure.  We will call around 7:15- 8:00 am to check on you and address any questions or concerns that you may have regarding the information given to you following your procedure. If we do not reach you, we will leave a message.     If any biopsies were taken you will be contacted by phone or by letter within the next 1-3 weeks.  Please call us at [phone] if you have not heard about the biopsies in 3 weeks.    SIGNATURES/CONFIDENTIALITY: You and/or your care partner have signed paperwork which will be entered into your electronic medical record.  These signatures attest to the fact that that the information above on your After Visit Summary has been reviewed and is understood.  Full responsibility of the confidentiality of this discharge information lies with you and/or your care-partner.

## 2023-07-31 NOTE — Op Note (Addendum)
 [facility] Patient Name: Angela Ray Procedure Date: 07/31/2023 10:43 AM MRN: [id number] Endoscopist: Napoleon Form , MD, [id number] Age: 73 Referring MD:  Date of Birth: 1950/10/26 Gender: Female Account #: [id number] Procedure:                Colonoscopy Indications:              High risk colon cancer surveillance: Personal                            history of multiple (3 or more) adenomas Medicines:                Monitored Anesthesia Care Procedure:                Pre-Anesthesia Assessment:                           - Prior to the procedure, a History and Physical                            was performed, and patient medications and                            allergies were reviewed. The patient's tolerance of                            previous anesthesia was also reviewed. The risks                            and benefits of the procedure and the sedation                            options and risks were discussed with the patient.                            All questions were answered, and informed consent                            was obtained. Prior Anticoagulants: The patient has                            taken no anticoagulant or antiplatelet agents. ASA                            Grade Assessment: III - A patient with severe                            systemic disease. After reviewing the risks and                            benefits, the patient was deemed in satisfactory                            condition to undergo the procedure.  After obtaining informed consent, the colonoscope                            was passed under direct vision. Throughout the                            procedure, the patient's blood pressure, pulse, and                            oxygen saturations were monitored continuously. The                            Olympus Scope [id number] was introduced through the                            anus  and advanced to the the cecum, identified by                            appendiceal orifice and ileocecal valve. The                            colonoscopy was performed without difficulty. The                            patient tolerated the procedure well. The quality                            of the bowel preparation was good. The ileocecal                            valve, appendiceal orifice, and rectum were                            photographed. Scope In: 10:53:56 AM Scope Out: 11:14:04 AM Scope Withdrawal Time: 0 hours 11 minutes 50 seconds  Total Procedure Duration: 0 hours 20 minutes 8 seconds  Findings:                 The perianal and digital rectal examinations were                            normal.                           A 5 mm polyp was found in the cecum. The polyp was                            sessile. The polyp was removed with a cold snare.                            Resection and retrieval were complete.                           Non-bleeding external and internal hemorrhoids were  found during retroflexion. The hemorrhoids were                            medium-sized. Complications:            No immediate complications. Estimated Blood Loss:     Estimated blood loss was minimal. Impression:               - One 5 mm polyp in the cecum, removed with a cold                            snare. Resected and retrieved.                           - Non-bleeding external and internal hemorrhoids. Recommendation:           - Patient has a contact number available for                            emergencies. The signs and symptoms of potential                            delayed complications were discussed with the                            patient. Return to normal activities tomorrow.                            Written discharge instructions were provided to the                            patient.                           - Resume previous  diet.                           - Continue present medications.                           - Await pathology results.                           - Repeat colonoscopy in 5 years for surveillance                            based on pathology results.                           - Schedule EGD next available appointment for                            epigastric/RUQ abdominal pain in the setting of                            frequent NSAID use                           -  Limit use of NSAID's                           - Pantoprazole 40mg  daily, 30 minutes before                            breakfast Rx for 30 days with 3 refills Napoleon Form, MD 07/31/2023 11:19:48 AM This report has been signed electronically.

## 2023-07-31 NOTE — Progress Notes (Signed)
 Pt sedate, gd SR's, VSS, report to RN
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
## 2023-08-03 ENCOUNTER — Encounter: Payer: Self-pay | Admitting: Family Medicine

## 2023-08-03 ENCOUNTER — Telehealth: Payer: Self-pay

## 2023-08-03 ENCOUNTER — Ambulatory Visit (INDEPENDENT_AMBULATORY_CARE_PROVIDER_SITE_OTHER): Admitting: Family Medicine

## 2023-08-03 ENCOUNTER — Other Ambulatory Visit (HOSPITAL_BASED_OUTPATIENT_CLINIC_OR_DEPARTMENT_OTHER): Payer: Self-pay

## 2023-08-03 ENCOUNTER — Ambulatory Visit (HOSPITAL_BASED_OUTPATIENT_CLINIC_OR_DEPARTMENT_OTHER)
Admission: RE | Admit: 2023-08-03 | Discharge: 2023-08-03 | Disposition: A | Discharge status: Home / Self Care | Source: Ambulatory Visit | Attending: Family Medicine | Admitting: Family Medicine

## 2023-08-03 VITALS — BP 102/72 | HR 67 | Temp 97.8°F | Resp 18 | Ht 71.0 in | Wt 154.6 lb

## 2023-08-03 DIAGNOSIS — M7989 Other specified soft tissue disorders: Secondary | ICD-10-CM | POA: Diagnosis not present

## 2023-08-03 DIAGNOSIS — Z131 Encounter for screening for diabetes mellitus: Secondary | ICD-10-CM

## 2023-08-03 DIAGNOSIS — E2839 Other primary ovarian failure: Secondary | ICD-10-CM

## 2023-08-03 DIAGNOSIS — G250 Essential tremor: Secondary | ICD-10-CM | POA: Diagnosis not present

## 2023-08-03 DIAGNOSIS — R0602 Shortness of breath: Secondary | ICD-10-CM | POA: Diagnosis not present

## 2023-08-03 DIAGNOSIS — L659 Nonscarring hair loss, unspecified: Secondary | ICD-10-CM

## 2023-08-03 DIAGNOSIS — E559 Vitamin D deficiency, unspecified: Secondary | ICD-10-CM

## 2023-08-03 DIAGNOSIS — R609 Edema, unspecified: Secondary | ICD-10-CM

## 2023-08-03 DIAGNOSIS — I5189 Other ill-defined heart diseases: Secondary | ICD-10-CM | POA: Diagnosis not present

## 2023-08-03 LAB — HEMOGLOBIN A1C: Hgb A1c MFr Bld: 5.3 % (ref 4.6–6.5)

## 2023-08-03 LAB — COMPREHENSIVE METABOLIC PANEL
ALT: 18 U/L (ref 0–35)
AST: 27 U/L (ref 0–37)
Albumin: 4.6 g/dL (ref 3.5–5.2)
Alkaline Phosphatase: 32 U/L — ABNORMAL LOW (ref 39–117)
BUN: 5 mg/dL — ABNORMAL LOW (ref 6–23)
CO2: 31 meq/L (ref 19–32)
Calcium: 9.3 mg/dL (ref 8.4–10.5)
Chloride: 105 meq/L (ref 96–112)
Creatinine, Ser: 0.78 mg/dL (ref 0.40–1.20)
GFR: 76.83 mL/min (ref >60.00)
Glucose, Bld: 96 mg/dL (ref 70–99)
Potassium: 4.1 meq/L (ref 3.5–5.1)
Sodium: 144 meq/L (ref 135–145)
Total Bilirubin: 0.8 mg/dL (ref 0.2–1.2)
Total Protein: 6.9 g/dL (ref 6.0–8.3)

## 2023-08-03 LAB — CBC
HCT: 39.3 % (ref 36.0–46.0)
Hemoglobin: 13.5 g/dL (ref 12.0–15.0)
MCHC: 34.4 g/dL (ref 30.0–36.0)
MCV: 94.4 fl (ref 78.0–100.0)
Platelets: 224 10*3/uL (ref 150.0–400.0)
RBC: 4.17 Mil/uL (ref 3.87–5.11)
RDW: 13.1 % (ref 11.5–15.5)
WBC: 4.1 10*3/uL (ref 4.0–10.5)

## 2023-08-03 LAB — VITAMIN D 25 HYDROXY (VIT D DEFICIENCY, FRACTURES): VITD: 92.75 ng/mL (ref 30.00–100.00)

## 2023-08-03 LAB — TSH: TSH: 2.47 u[IU]/mL (ref 0.35–5.50)

## 2023-08-03 LAB — BRAIN NATRIURETIC PEPTIDE: Pro B Natriuretic peptide (BNP): 104 pg/mL — ABNORMAL HIGH (ref 0.0–100.0)

## 2023-08-03 LAB — FERRITIN: Ferritin: 204.7 ng/mL (ref 10.0–291.0)

## 2023-08-03 LAB — VITAMIN B12: Vitamin B-12: >1537 pg/mL — ABNORMAL HIGH (ref 211–911)

## 2023-08-03 NOTE — Patient Instructions (Signed)
 Please go to the lab and then to the ground floor imaging dept to set up your leg ultrasound and bone density test I will be in touch with your results asap

## 2023-08-03 NOTE — Telephone Encounter (Signed)
 Follow up call to pt, lm for pt to call if having any difficulty with normal activities or eating and drinking.  Also to call if any other questions or concerns.

## 2023-08-04 LAB — SURGICAL PATHOLOGY

## 2023-08-06 ENCOUNTER — Encounter: Payer: Self-pay | Admitting: Family Medicine

## 2023-08-06 ENCOUNTER — Ambulatory Visit (HOSPITAL_BASED_OUTPATIENT_CLINIC_OR_DEPARTMENT_OTHER)
Admission: RE | Admit: 2023-08-06 | Discharge: 2023-08-06 | Disposition: A | Discharge status: Home / Self Care | Source: Ambulatory Visit | Attending: Family Medicine | Admitting: Family Medicine

## 2023-08-06 DIAGNOSIS — Z780 Asymptomatic menopausal state: Secondary | ICD-10-CM | POA: Diagnosis not present

## 2023-08-06 DIAGNOSIS — E2839 Other primary ovarian failure: Secondary | ICD-10-CM | POA: Diagnosis not present

## 2023-08-06 DIAGNOSIS — M8580 Other specified disorders of bone density and structure, unspecified site: Secondary | ICD-10-CM | POA: Insufficient documentation

## 2023-08-06 DIAGNOSIS — M85852 Other specified disorders of bone density and structure, left thigh: Secondary | ICD-10-CM | POA: Diagnosis not present

## 2023-08-10 ENCOUNTER — Encounter: Payer: Self-pay | Admitting: Gastroenterology

## 2023-08-10 ENCOUNTER — Ambulatory Visit (AMBULATORY_SURGERY_CENTER): Admitting: Gastroenterology

## 2023-08-10 VITALS — BP 97/63 | HR 56 | Temp 97.3°F | Resp 13 | Ht 71.0 in | Wt 150.0 lb

## 2023-08-10 DIAGNOSIS — M797 Fibromyalgia: Secondary | ICD-10-CM | POA: Diagnosis not present

## 2023-08-10 DIAGNOSIS — F419 Anxiety disorder, unspecified: Secondary | ICD-10-CM | POA: Diagnosis not present

## 2023-08-10 DIAGNOSIS — R1013 Epigastric pain: Secondary | ICD-10-CM | POA: Diagnosis not present

## 2023-08-10 DIAGNOSIS — R1011 Right upper quadrant pain: Secondary | ICD-10-CM | POA: Diagnosis not present

## 2023-08-10 DIAGNOSIS — F32A Depression, unspecified: Secondary | ICD-10-CM | POA: Diagnosis not present

## 2023-08-10 MED ORDER — SODIUM CHLORIDE 0.9 % IV SOLN: 500.0000 mL | Freq: Once | INTRAVENOUS | Status: DC

## 2023-08-10 NOTE — Progress Notes (Signed)
  Gastroenterology History and Physical   Primary Care Physician:  Pearline Cables, MD   Reason for Procedure:  Epigastric/RUQ abd pain  Plan:    EGD with possible interventions as needed     HPI: Angela Ray is a very pleasant 73 y.o. female here for EGD for further evaluation of persistent epigastric and right upper quadrant pain in the setting of frequent NSAID use.  Will need to exclude erosive gastritis of gastroduodenal ulcer.   The risks and benefits as well as alternatives of endoscopic procedure(s) have been discussed and reviewed. All questions answered. The patient agrees to proceed.    Past Medical History:  Diagnosis Date   Allergy 2002   Anxiety    Barrett's esophagus    in the past   Colon polyp    Depression    Dry eye syndrome of both eyes    Dupuytren's contracture of both hands 10/2016   sees Dr.Gramig--[facility]   Essential tremor 2016   legs, arm,lower jaw   Female bladder prolapse    Fibroid    Fibromyalgia    GERD (gastroesophageal reflux disease)    Heat exhaustion    Herniated disc, cervical    C3   Hiatal hernia    History of cardiovascular stress test    Lexiscan Myoview 7/16:  EF 65%, inferolateral defect (prob artifactual), no ischemia   History of recurrent UTIs    Hyperlipidemia    IBS (irritable bowel syndrome)    constipation   Leg cramps    Low back pain    Lumbar herniated disc    L4,L5   Osteoarthritis    Osteopenia    Osteoporosis    Osteopenia per pt   Rheumatoid arthritis, adult (HCC) 2016   both feet   Skin cancer    Spinal stenosis    lumbar   Tremors of nervous system    Vertigo     Past Surgical History:  Procedure Laterality Date   ABDOMINAL HYSTERECTOMY  2003   TAH still has ovaries--Dr. Edward Jolly   ANAL RECTAL MANOMETRY [name] 12/23/2019   Procedure: ANO RECTAL MANOMETRY;  Surgeon: Napoleon Form, MD;  Location: [facility];  Service: Endoscopy;  Laterality: [name];   CERVICAL  LAMINECTOMY  2002   C5/C6   CHOLECYSTECTOMY  2008   fractured ankle Left 10/2014   ROTATOR CUFF REPAIR Right 2011    Prior to Admission medications   Medication Sig Start Date End Date Taking? Authorizing Provider  Azelaic Acid 15 % cream Apply 1 application topically every morning. 02/13/19  Yes [provider]  busPIRone (BUSPAR) 30 MG tablet Take 1 tablet (30 mg total) by mouth 2 (two) times daily. 02/25/23  Yes   Ca Phosphate-Cholecalciferol (CALTRATE GUMMY BITES) 250-10 MG-MCG CHEW Chew by mouth. 12/30/16  Yes [provider]  clonazePAM (KLONOPIN) 1 MG tablet Take 1 tablet (1 mg total) by mouth 3 (three) times daily. 07/28/23  Yes   estradiol (ESTRACE) 0.1 MG/GM vaginal cream Apply a pea sized amount daily. 02/20/23  Yes   fluticasone (FLONASE) 50 MCG/ACT nasal spray Place 2 sprays into both nostrils at bedtime as needed for allergies. 02/23/23  Yes Copland, Gwenlyn Found, MD  ipratropium (ATROVENT) 0.06 % nasal spray Place 2 sprays into both nostrils 4 (four) times daily. 02/21/23  Yes Luciano Cutter, MD  Lifitegrast Benay Spice) 5 % SOLN Apply 1 drop to eye daily.   Yes [provider]  OVER THE COUNTER MEDICATION Take 1  capsule by mouth 3 (three) times daily. Hydro eye   Yes [provider]  pantoprazole (PROTONIX) 40 MG tablet Take 1 tablet (40 mg total) by mouth daily. 07/31/23  Yes Aziz Slape, Eleonore Chiquito, MD  PARoxetine (PAXIL) 20 MG tablet Take 1 tablet (20 mg total) by mouth 2 (two) times daily. 02/03/23  Yes   propranolol (INDERAL) 20 MG tablet Take 1 tablet (20 mg total) by mouth 2 (two) times daily. 07/16/23  Yes Copland, Gwenlyn Found, MD  trimethoprim (TRIMPEX) 100 MG tablet Take 1 tablet (100 mg total) by mouth daily. 02/20/23  Yes   cetirizine (ZYRTEC) 10 MG tablet Take 10 mg by mouth daily as needed for allergies.     [provider]  Cyanocobalamin (B-12) 2500 MCG TABS Take by mouth daily. Patient not taking: Reported on 08/10/2023    [provider]  cyclobenzaprine (FLEXERIL) 10 MG tablet TAKE 1 TABLET BY MOUTH TWICE A DAY AS NEEDED FOR MUSCLE SPASMS 02/23/23   Copland, Gwenlyn Found, MD  ibuprofen (ADVIL) 600 MG tablet Take 1 tablet (600 mg total) by mouth every 8 (eight) hours as needed (pain). 02/23/23   Copland, Gwenlyn Found, MD  lidocaine (XYLOCAINE) 5 % ointment Apply daily as needed 12/30/16   [provider]  Lifitegrast Benay Spice) 5 % SOLN Place 1 drop into both eyes 2 (two) times daily. 01/01/23   Shon Millet, MD  metroNIDAZOLE (METROCREAM) 0.75 % cream Apply topically daily as needed. Patient not taking: Reported on 08/10/2023 02/11/22   Copland, Gwenlyn Found, MD  Multiple Vitamins-Minerals (HAIR/SKIN/NAILS/BIOTIN PO) Take by mouth daily. Patient not taking: Reported on 08/10/2023    [provider]  traMADol (ULTRAM) 50 MG tablet Take 1 tablet (50 mg total) by mouth every 8 (eight) hours as needed. 02/23/23   Copland, Gwenlyn Found, MD  tretinoin (RETIN-A) 0.025 % cream Apply 1 application topically at bedtime. 01/29/19   [provider]    Current Outpatient Medications  Medication Sig Dispense Refill   Azelaic Acid 15 % cream Apply 1 application topically every morning.     busPIRone (BUSPAR) 30 MG tablet Take 1 tablet (30 mg total) by mouth 2 (two) times daily. 180 tablet 3   Ca Phosphate-Cholecalciferol (CALTRATE GUMMY BITES) 250-10 MG-MCG CHEW Chew by mouth.     clonazePAM (KLONOPIN) 1 MG tablet Take 1 tablet (1 mg total) by mouth 3 (three) times daily. 270 tablet 0   estradiol (ESTRACE) 0.1 MG/GM vaginal cream Apply a pea sized amount daily. 42.5 g 11   fluticasone (FLONASE) 50 MCG/ACT nasal spray Place 2 sprays into both nostrils at bedtime as needed for allergies. 48 g 4   ipratropium (ATROVENT) 0.06 % nasal spray Place 2 sprays into both nostrils 4 (four) times daily. 45 mL 1   Lifitegrast (XIIDRA) 5 % SOLN Apply 1 drop to eye daily.     OVER THE COUNTER MEDICATION Take 1 capsule by mouth 3  (three) times daily. Hydro eye     pantoprazole (PROTONIX) 40 MG tablet Take 1 tablet (40 mg total) by mouth daily. 90 tablet 3   PARoxetine (PAXIL) 20 MG tablet Take 1 tablet (20 mg total) by mouth 2 (two) times daily. 180 tablet 3   propranolol (INDERAL) 20 MG tablet Take 1 tablet (20 mg total) by mouth 2 (two) times daily. 180 tablet 0   trimethoprim (TRIMPEX) 100 MG tablet Take 1 tablet (100 mg total) by mouth daily. 90 tablet 3   cetirizine (ZYRTEC) 10  MG tablet Take 10 mg by mouth daily as needed for allergies.      Cyanocobalamin (B-12) 2500 MCG TABS Take by mouth daily. (Patient not taking: Reported on 08/10/2023)     cyclobenzaprine (FLEXERIL) 10 MG tablet TAKE 1 TABLET BY MOUTH TWICE A DAY AS NEEDED FOR MUSCLE SPASMS 30 tablet 0   ibuprofen (ADVIL) 600 MG tablet Take 1 tablet (600 mg total) by mouth every 8 (eight) hours as needed (pain). 40 tablet 1   lidocaine (XYLOCAINE) 5 % ointment Apply daily as needed     Lifitegrast (XIIDRA) 5 % SOLN Place 1 drop into both eyes 2 (two) times daily. 180 each 0   metroNIDAZOLE (METROCREAM) 0.75 % cream Apply topically daily as needed. (Patient not taking: Reported on 08/10/2023) 45 g 1   Multiple Vitamins-Minerals (HAIR/SKIN/NAILS/BIOTIN PO) Take by mouth daily. (Patient not taking: Reported on 08/10/2023)     traMADol (ULTRAM) 50 MG tablet Take 1 tablet (50 mg total) by mouth every 8 (eight) hours as needed. 20 tablet 0   tretinoin (RETIN-A) 0.025 % cream Apply 1 application topically at bedtime.     Current Facility-Administered Medications  Medication Dose Route Frequency Provider Last Rate Last Admin   0.9 %  sodium chloride infusion  500 mL Intravenous Once Elishia Kaczorowski, Eleonore Chiquito, MD       bisacodyl (DULCOLAX) EC tablet 5 mg  5 mg Oral Daily PRN Napoleon Form, MD        Allergies as of 08/10/2023 - Review Complete 08/10/2023  Allergen Reaction Noted   Aciphex [rabeprazole sodium] Other (See Comments) 06/24/2011   Bupropion hcl Other (See  Comments) 05/19/2006   Cephalexin Nausea Only and Other (See Comments) 05/19/2006   Codeine Other (See Comments) 06/24/2011   Diclofenac Other (See Comments) 07/31/2023   Esomeprazole magnesium Other (See Comments) 06/24/2011   Methadone Palpitations 01/30/2015   Sertraline hcl Other (See Comments) 05/19/2006   Prednisone Other (See Comments) 05/20/2021   Doxycycline hyclate Nausea Only 05/19/2006   Linaclotide Diarrhea 11/07/2019    Family History  Problem Relation Age of Onset   Atrial fibrillation Mother    Multiple myeloma Mother    Skin cancer Mother    Coronary artery disease Mother    Irritable bowel syndrome Mother    Coronary artery disease Father 42       CABG 2002, followd by Dr, Myrtis Ser   Prostate cancer Father    Bladder Cancer Father    Skin cancer Father    Diabetes Father 61       DM type 2   Cancer Maternal Aunt        bone cancer   Diabetes Maternal Grandmother    Hypertension Maternal Grandmother    Diabetes Maternal Grandfather    Hypertension Maternal Grandfather    Osteoarthritis Paternal Grandmother    Colon cancer Neg Hx    Esophageal cancer Neg Hx    Stomach cancer Neg Hx    Rectal cancer Neg Hx    Colon polyps Neg Hx     Social History   Socioeconomic History   Marital status: Married    Spouse name: Not on file   Number of children: 0   Years of education: 12   Highest education level: 12th grade  Occupational History   Occupation: Retired    Associate Professor: RETIRED  Tobacco Use   Smoking status: Never   Smokeless tobacco: Never  Vaping Use   Vaping status: Never Used  Substance and Sexual  Activity   Alcohol use: No    Alcohol/week: 0.0 standard drinks of alcohol    Comment: occ glass of wine   Drug use: No   Sexual activity: Not Currently    Partners: Male    Birth control/protection: Surgical    Comment: TAH still has ovaries  Other Topics Concern   Not on file  Social History Narrative   Lives at home with her husband.    Right-handed.   Rarely uses caffeine.   Social Drivers of Corporate investment banker Strain: Low Risk  (05/29/2023)   Overall Financial Resource Strain (CARDIA)    Difficulty of Paying Living Expenses: Not hard at all  Food Insecurity: No Food Insecurity (05/29/2023)   Hunger Vital Sign    Worried About Running Out of Food in the Last Year: Never true    Ran Out of Food in the Last Year: Never true  Transportation Needs: No Transportation Needs (05/29/2023)   PRAPARE - Administrator, Civil Service (Medical): No    Lack of Transportation (Non-Medical): No  Physical Activity: Insufficiently Active (05/29/2023)   Exercise Vital Sign    Days of Exercise per Week: 3 days    Minutes of Exercise per Session: 20 min  Stress: No Stress Concern Present (05/29/2023)   [employer] of Occupational Health - Occupational Stress Questionnaire    Feeling of Stress : Only a little  Social Connections: Unknown (05/29/2023)   Social Connection and Isolation Panel [NHANES]    Frequency of Communication with Friends and Family: More than three times a week    Frequency of Social Gatherings with Friends and Family: Once a week    Attends Religious Services: Patient declined    Database administrator or Organizations: No    Attends Engineer, structural: More than 4 times per year    Marital Status: Married  Catering manager Violence: Unknown (11/24/2022)   Received from [facility]   HITS    Physically Hurt: Not on file    Insult or Talk Down To: Not on file    Threaten Physical Harm: Not on file    Scream or Curse: Not on file    Review of Systems:  All other review of systems negative except as mentioned in the HPI.  Physical Exam: Vital signs in last 24 hours: BP 101/69   Pulse 60   Temp (!) 97.3 F (36.3 C)   Ht 5\' 11"  (1.803 m)   Wt 150 lb (68 kg)   LMP 02/09/2002 (Approximate)   SpO2 97%   BMI 20.92 kg/m  General:   Alert, NAD Lungs:  Clear .   Heart:   Regular rate and rhythm Abdomen:  Soft, nontender and nondistended. Neuro/Psych:  Alert and cooperative. Normal mood and affect. A and O x 3  Reviewed labs, radiology imaging, old records and pertinent past GI work up  Patient is appropriate for planned procedure(s) and anesthesia in an ambulatory setting   K. Scherry Ran , MD [phone]

## 2023-08-10 NOTE — Op Note (Signed)
 [facility] Patient Name: Angela Ray Procedure Date: 08/10/2023 1:08 PM MRN: [id number] Endoscopist: Napoleon Form , MD, [id number] Age: 73 Referring MD:  Date of Birth: 1951/01/15 Gender: Female Account #: [id number] Procedure:                Upper GI endoscopy Indications:              Epigastric abdominal pain Medicines:                Monitored Anesthesia Care Procedure:                Pre-Anesthesia Assessment:                           - Prior to the procedure, a History and Physical                            was performed, and patient medications and                            allergies were reviewed. The patient's tolerance of                            previous anesthesia was also reviewed. The risks                            and benefits of the procedure and the sedation                            options and risks were discussed with the patient.                            All questions were answered, and informed consent                            was obtained. Prior Anticoagulants: The patient has                            taken no anticoagulant or antiplatelet agents. ASA                            Grade Assessment: II - A patient with mild systemic                            disease. After reviewing the risks and benefits,                            the patient was deemed in satisfactory condition to                            undergo the procedure.                           After obtaining informed consent, the endoscope was  passed under direct vision. Throughout the                            procedure, the patient's blood pressure, pulse, and                            oxygen saturations were monitored continuously. The                            GIF [id number] #[id number] was introduced through the                            mouth, and advanced to the second part of duodenum.                            The upper GI  endoscopy was accomplished without                            difficulty. The patient tolerated the procedure                            well. Scope In: Scope Out: Findings:                 The Z-line was regular and was found 40 cm from the                            incisors.                           The examined esophagus was normal.                           No gross lesions were noted in the entire examined                            stomach.                           The cardia and gastric fundus were normal on                            retroflexion.                           The examined duodenum was normal. Complications:            No immediate complications. Estimated Blood Loss:     Estimated blood loss was minimal. Impression:               - Z-line regular, 40 cm from the incisors.                           - Normal esophagus.                           - No gross lesions in the entire stomach.                           -  Normal examined duodenum.                           - No specimens collected. Recommendation:           - Patient has a contact number available for                            emergencies. The signs and symptoms of potential                            delayed complications were discussed with the                            patient. Return to normal activities tomorrow.                            Written discharge instructions were provided to the                            patient.                           - Resume previous diet.                           - Continue present medications.                           - Limit use of NSAID's                           - Return to GI office in 4 months. Napoleon Form, MD 08/10/2023 1:29:19 PM This report has been signed electronically.

## 2023-08-10 NOTE — Progress Notes (Signed)
 No appts available for post-procedure follow up with Dr. Lavon Paganini.  Note made on pts chart for [facility] office to follow up.  Pt and care partner aware.

## 2023-08-10 NOTE — Progress Notes (Signed)
 Report to PACU, RN, vss, BBS= Clear.

## 2023-08-10 NOTE — Progress Notes (Signed)
 Pt's states no medical or surgical changes since previsit or office visit.

## 2023-08-10 NOTE — Patient Instructions (Addendum)
 Resume previous diet and medications.  Limit use of NSAIDS.  The office will call you for appt in 4 months - if you do not hear from [place], please call to schedule appt.    YOU HAD AN ENDOSCOPIC PROCEDURE TODAY AT THE [facility]:   Refer to the procedure report that was given to you for any specific questions about what was found during the examination.  If the procedure report does not answer your questions, please call your gastroenterologist to clarify.  If you requested that your care partner not be given the details of your procedure findings, then the procedure report has been included in a sealed envelope for you to review at your convenience later.  YOU SHOULD EXPECT: Some feelings of bloating in the abdomen. Passage of more gas than usual.  Walking can help get rid of the air that was put into your GI tract during the procedure and reduce the bloating. If you had a lower endoscopy (such as a colonoscopy or flexible sigmoidoscopy) you may notice spotting of blood in your stool or on the toilet paper. If you underwent a bowel prep for your procedure, you may not have a normal bowel movement for a few days.  Please Note:  You might notice some irritation and congestion in your nose or some drainage.  This is from the oxygen used during your procedure.  There is no need for concern and it should clear up in a day or so.  SYMPTOMS TO REPORT IMMEDIATELY:  Following upper endoscopy (EGD)  Vomiting of blood or coffee ground material  New chest pain or pain under the shoulder blades  Painful or persistently difficult swallowing  New shortness of breath  Fever of 100F or higher  Black, tarry-looking stools  For urgent or emergent issues, a gastroenterologist can be reached at any hour by calling [phone]. Do not use MyChart messaging for urgent concerns.    DIET:  We do recommend a small meal at first, but then you may proceed to your regular diet.  Drink plenty of fluids but  you should avoid alcoholic beverages for 24 hours.  ACTIVITY:  You should plan to take it easy for the rest of today and you should NOT DRIVE or use heavy machinery until tomorrow (because of the sedation medicines used during the test).    FOLLOW UP: Our staff will call the number listed on your records the next business day following your procedure.  We will call around 7:15- 8:00 am to check on you and address any questions or concerns that you may have regarding the information given to you following your procedure. If we do not reach you, we will leave a message.     If any biopsies were taken you will be contacted by phone or by letter within the next 1-3 weeks.  Please call us at [phone] if you have not heard about the biopsies in 3 weeks.    SIGNATURES/CONFIDENTIALITY: You and/or your care partner have signed paperwork which will be entered into your electronic medical record.  These signatures attest to the fact that that the information above on your After Visit Summary has been reviewed and is understood.  Full responsibility of the confidentiality of this discharge information lies with you and/or your care-partner.

## 2023-08-11 ENCOUNTER — Telehealth: Payer: Self-pay | Admitting: *Deleted

## 2023-08-11 NOTE — Telephone Encounter (Signed)
 Attempted post procedure follow up call.  No answer - LVM.

## 2023-08-14 ENCOUNTER — Other Ambulatory Visit (HOSPITAL_BASED_OUTPATIENT_CLINIC_OR_DEPARTMENT_OTHER): Payer: Self-pay

## 2023-08-14 MED ORDER — TRAZODONE HCL 50 MG PO TABS
50.0000 mg | ORAL_TABLET | Freq: Every evening | ORAL | 3 refills | Status: AC | PRN
  Filled 2023-08-14: qty 270, 90d supply, fill #0
  Filled 2023-10-05 – 2023-12-15 (×3): qty 270, 90d supply, fill #1

## 2023-08-20 ENCOUNTER — Other Ambulatory Visit: Payer: Self-pay

## 2023-08-20 ENCOUNTER — Other Ambulatory Visit (HOSPITAL_BASED_OUTPATIENT_CLINIC_OR_DEPARTMENT_OTHER): Payer: Self-pay

## 2023-08-31 ENCOUNTER — Other Ambulatory Visit (HOSPITAL_BASED_OUTPATIENT_CLINIC_OR_DEPARTMENT_OTHER): Payer: Self-pay

## 2023-09-07 ENCOUNTER — Other Ambulatory Visit: Payer: Self-pay

## 2023-09-07 ENCOUNTER — Other Ambulatory Visit (HOSPITAL_BASED_OUTPATIENT_CLINIC_OR_DEPARTMENT_OTHER): Payer: Self-pay

## 2023-09-15 ENCOUNTER — Ambulatory Visit: Payer: Medicare Other | Admitting: Internal Medicine

## 2023-09-17 ENCOUNTER — Encounter: Payer: Self-pay | Admitting: Internal Medicine

## 2023-09-17 ENCOUNTER — Other Ambulatory Visit: Payer: Self-pay

## 2023-09-17 ENCOUNTER — Ambulatory Visit: Admitting: Internal Medicine

## 2023-09-17 VITALS — BP 100/63 | HR 64 | Resp 16 | Ht 71.0 in | Wt 158.2 lb

## 2023-09-17 DIAGNOSIS — A319 Mycobacterial infection, unspecified: Secondary | ICD-10-CM | POA: Diagnosis not present

## 2023-09-17 NOTE — Patient Instructions (Signed)
 I think it is entirely fine to continue salt water exercise It won't cause infection  Sometimes it can induced irritation to the chemicals or salt concentration but if symptoms are several days or fever, chill, then let me know   Chest ct to be done within the next several weeks. Our staff can help them know where you want it done    See me in another year or sooner if anything concerning

## 2023-09-17 NOTE — Progress Notes (Signed)
 [facility]    Patient Active Problem List   Diagnosis Date Noted   Osteopenia 08/06/2023   History of cardiovascular stress test    Dyssynergic defecation    Squamous cell carcinoma in situ (SCCIS) of skin 05/24/2018   External hemorrhoid 12/09/2016   Globus sensation 12/09/2016   Gas pain 12/09/2016   Abdominal pain, epigastric 12/09/2016   Chronic insomnia 12/04/2016   Essential tremor 12/04/2016   Fibromyalgia 11/25/2016   Lumbar stenosis 11/25/2016   Paresthesia 05/25/2015   Tremor 05/25/2015   IBS (irritable bowel syndrome) 02/07/2015   Recurrent UTI 02/07/2015   Constipation due to outlet dysfunction 12/21/2013   Incontinence of feces 12/16/2012   Chest pain 06/24/2011   Anxiety 07/11/2008   GERD 07/05/2008   Gastritis and gastroduodenitis 07/05/2008   Diaphragmatic hernia 07/05/2008   Calculus of gallbladder 07/05/2008      HPI: Angela Ray is a 73 y.o. female history of RA untreated, dry eyes/mouth, fibromyalgia, gerd, postnasal gtt, referred by pcp here for concern of ntm lung disease in setting abnormal chest ct  Chart reviewed   Patient had a renal ct scan done recently for w/u kidney stone which showed pulm nodule. She subsquently has chest ct done that showed extensive nodular process  She has developed 6 months persistent stable mostly dry cough at times with hoarse voice. No hemoptysis. At times noticed green brown sputum. No b sx. No dyspnea or chest pain  She reports chronic post-nasal gtt and having gerd as well. She uses ipratropium without help for the nasal gtt  She has dry eyes dry mouths and also dx of RA not treated. She previously was given some injection, along with prednisone, and methotrexate for RA. She had stopped seeing rheum for years as she doesn't get along well with her previous docx. She complains of joint stiffness and pain in ankles/wrists  No spine pain, rash, dysphagia,  n/v/diarrhea.  Outside of above sx she feels rather well   Social: Retired Engineer, mining; no pet birds/poultry No chemical exposure Born/raised in [place] ; has been in [place] ; air travel stops by [place]  No tb risk factor No smoking  -------------------- 04/24/22 id clinic assessment She had 3 sputum that probed mac; no sensitivity that I could see Also has been seeing pulm Repeat ct 02/2022 and 03/2022 stable reticular nodular lung changes She is coughing much less (almost none). She said she sold her old car where she smells musty odor and she thinks that might make her cough  She is singing in choir now and reengaging in strenuous exercise routine.  No f/c/nightsweat Good appetite No weight loss  Reviewed chest ct's with patient   ------------- 09/23/2022 id clinic f/u Reviewed 08/2022 chest ct -- which looks improved nodular changes No b sx No dyspnea/chest pain No worsening cough Again previous sputum cx x2 with mac but no susceptibility done We had decided against treatment for pulm mac at this time  No cough   09/17/23 id clinic f/u Patient was using a salt water indoor pool 2 months ago and she afterward for a couple days noticed a little tightness in her chest and dry cough lasted a couple days. No fever, chill, malaise She used the pool a couple times and both times noticed similar sx She otherwise remains clinically stable; very active, no limitation in respiratory functions Due for chest ct this summer Patient also due to see dr Rendall Carpenter  again soon No weight loss, and appetite is good     Review of Systems: ROS All other ROS negative     Past Medical History:  Diagnosis Date   Allergy 2002   Anxiety    Barrett's esophagus    in the past   Colon polyp    Depression    Dry eye syndrome of both eyes    Dupuytren's contracture of both hands 10/2016   sees Dr.Gramig--[facility]   Essential tremor 2016   legs, arm,lower jaw   Female  bladder prolapse    Fibroid    Fibromyalgia    GERD (gastroesophageal reflux disease)    Heat exhaustion    Herniated disc, cervical    C3   Hiatal hernia    History of cardiovascular stress test    Lexiscan  Myoview 7/16:  EF 65%, inferolateral defect (prob artifactual), no ischemia   History of recurrent UTIs    Hyperlipidemia    IBS (irritable bowel syndrome)    constipation   Leg cramps    Low back pain    Lumbar herniated disc    L4,L5   Osteoarthritis    Osteopenia    Osteoporosis    Osteopenia per pt   Rheumatoid arthritis, adult (HCC) 2016   both feet   Skin cancer    Spinal stenosis    lumbar   Tremors of nervous system    Vertigo     Social History   Tobacco Use   Smoking status: Never   Smokeless tobacco: Never  Vaping Use   Vaping status: Never Used  Substance Use Topics   Alcohol use: No    Alcohol/week: 0.0 standard drinks of alcohol    Comment: occ glass of wine   Drug use: No    Family History  Problem Relation Age of Onset   Atrial fibrillation Mother    Multiple myeloma Mother    Skin cancer Mother    Coronary artery disease Mother    Irritable bowel syndrome Mother    Coronary artery disease Father 70       CABG 2002, followd by Dr, Ardell Koller   Prostate cancer Father    Bladder Cancer Father    Skin cancer Father    Diabetes Father 21       DM type 2   Cancer Maternal Aunt        bone cancer   Diabetes Maternal Grandmother    Hypertension Maternal Grandmother    Diabetes Maternal Grandfather    Hypertension Maternal Grandfather    Osteoarthritis Paternal Grandmother    Colon cancer Neg Hx    Esophageal cancer Neg Hx    Stomach cancer Neg Hx    Rectal cancer Neg Hx    Colon polyps Neg Hx     Allergies  Allergen Reactions   Aciphex [Rabeprazole Sodium] Other (See Comments)    REACTION: "FELT LIKE INDIGESTION"   Bupropion Hcl Other (See Comments)    REACTION: "DIDN'T MAKE ME FEEL RIGHT IN THE HEAD"   Cephalexin Nausea Only and  Other (See Comments)    REACTION: "GAVE YEAST INFECTION"   Codeine Other (See Comments)    REACTION: "SEVERE NAUSEA, HEART PALPITATION, PASSING OUT, HEART ATTACK LIKE SYMPTOMS"   Diclofenac  Other (See Comments)    Lowered O2 level per pt   Esomeprazole Magnesium Other (See Comments)    Nexium=REACTION: " CAUSE TASTE BUDS TO FALL OUT"   Methadone Palpitations    All opioids: Other Reaction(s): GI  Intolerance, Respiratory Distress   Sertraline Hcl Other (See Comments)    REACTION: "DIDN'T MAKE ME FEEL RIGHT IN THE HEAD"   Prednisone Other (See Comments)    Suicidal thoughts   Doxycycline Hyclate Nausea Only   Linaclotide  Diarrhea    OBJECTIVE: There were no vitals filed for this visit.  There is no height or weight on file to calculate BMI.   Physical Exam General/constitutional: no distress, pleasant HEENT: Normocephalic, PER, Conj Clear, EOMI, Oropharynx clear Neck supple CV: rrr no mrg Lungs: clear to auscultation, normal respiratory effort Abd: Soft, Nontender Ext: no edema Skin: No Rash Neuro: nonfocal MSK: no peripheral joint swelling/tenderness/warmth; back spines nontender      Lab: Lab Results  Component Value Date   WBC 4.1 08/03/2023   HGB 13.5 08/03/2023   HCT 39.3 08/03/2023   MCV 94.4 08/03/2023   PLT 224.0 08/03/2023   Last metabolic panel Lab Results  Component Value Date   GLUCOSE 96 08/03/2023   NA 144 08/03/2023   K 4.1 08/03/2023   CL 105 08/03/2023   CO2 31 08/03/2023   BUN 5 (L) 08/03/2023   CREATININE 0.78 08/03/2023   GFRNONAA >60 05/05/2022   CALCIUM 9.3 08/03/2023   PROT 6.9 08/03/2023   ALBUMIN 4.6 08/03/2023   LABGLOB 2.3 03/06/2017   AGRATIO 2.0 03/06/2017   BILITOT 0.8 08/03/2023   ALKPHOS 32 (L) 08/03/2023   AST 27 08/03/2023   ALT 18 08/03/2023   ANIONGAP 9 05/05/2022    Microbiology:  Serology:  Imaging: Reviewed    03/20/22 chest ct FINDINGS: No definite abnormality seen involving the visualized portions  of the extracardiac vascular structures. Calcified right hilar lymph node is noted consistent with prior granulomatous disease. Visualized portion of upper abdomen is unremarkable. Large calcified granuloma is noted in superior segment of right lower lobe. Stable reticulonodular densities are noted in the right lung predominantly most consistent with chronic mycobacterial infection as noted on prior CT scan. Visualized skeleton is unremarkable.   IMPRESSION: Stable chronic lung findings as described on recently performed CT scan. No definite acute abnormality is noted in the visualized extracardiac portions of the chest.    11/2022 chest ct Stable findings typical for chronic MAI with nodules and bronchiectasis largely in the right mid lung. There was otherwise no acute cardiopulmonary process identified.     Assessment/plan: Problem List Items Addressed This Visit   None Visit Diagnoses       Atypical mycobacterial infection    -  Primary        Reviewed ct and symptoms with her. Discuss a potential dx is chronic fungal/ntm infection. I do not suspect tb  For ntm this doesn't look classic lady's windemere syndrome to me in terms of anatomy. And symptomatically it is not impressive for ntm lung disease either  She has multiple confounders to consider r/o such as connective tissue related lung nodules/ILD and also gerd associated pulm parenchymal changes  Discuss with her the low success rate of treatment for ntm lung, high relapse, high abx toxicity so requiring a firm dx before treatment considered. Explain the process might take a few months with more testings and pulmonology evaluation as well    -refer to pulm -sputum afb x3 (avoid gargling with water/brusing prior to getting sample); fungal serology; quantiferon gold -will reevaluate in 3 months with plan to repeat chest ct -advise her to see rheumatology again due to systemic nature of RA and need for potential  immunosuppressants  ---------------  04/24/22 assessment Sputum culture x3 all grew MAC from 9/25-9/27/2023; I wonder why there is no susceptibility testing At this time she feels even better than our first visit. She sold her car where she smells musty odor which she thinks contribute to cough. But now minimal coughing, singing in choir for at least 90 minutes at a time and reengaging in rigorous physical exercise without problems. Also chest ct repeat shows stability of reticular nodular changes  Will monitor her four months from now. And if symptoms/imaging continue to be stable we can space out visit.   -------- 09/23/22 id clinic assessment Reviewed ct together with patient Clinically no sign of active pulm mac in terms of needing treatment Will continue monitoring -- f/u 1 year F/u pulm as indicated with their team    09/17/23 id clinic f/u Chest ct 11/2022 stable nodular changes She has pending chest ct for this July 2025 ordered by her pulmonologist dr Chi Clinically stable Salt water pool use with some brief cough unclear what it's about. Doesn't appear to be "hot tub" pneumonitis or new infection. ?saline related cough induction vs irritant from pool chemical  F/u 1 year  Continue off treatment for mac     Follow-up: Return in about 1 year (around 09/16/2024).  Jamesetta Mcbride, MD [facility] 09/17/2023, 10:02 AM

## 2023-09-22 ENCOUNTER — Ambulatory Visit: Payer: Self-pay | Admitting: Gastroenterology

## 2023-09-29 ENCOUNTER — Ambulatory Visit: Payer: Medicare Other | Admitting: Internal Medicine

## 2023-10-05 ENCOUNTER — Encounter (HOSPITAL_BASED_OUTPATIENT_CLINIC_OR_DEPARTMENT_OTHER): Payer: Self-pay | Admitting: Pharmacist

## 2023-10-05 ENCOUNTER — Other Ambulatory Visit (HOSPITAL_BASED_OUTPATIENT_CLINIC_OR_DEPARTMENT_OTHER): Payer: Self-pay

## 2023-10-06 ENCOUNTER — Other Ambulatory Visit: Payer: Self-pay

## 2023-10-07 ENCOUNTER — Other Ambulatory Visit (HOSPITAL_BASED_OUTPATIENT_CLINIC_OR_DEPARTMENT_OTHER): Payer: Self-pay

## 2023-10-07 MED FILL — Ipratropium Bromide Nasal Soln 0.06% (42 MCG/SPRAY): NASAL | 57 days supply | Qty: 45 | Fill #1 | Status: AC

## 2023-10-12 ENCOUNTER — Other Ambulatory Visit (HOSPITAL_BASED_OUTPATIENT_CLINIC_OR_DEPARTMENT_OTHER): Payer: Self-pay

## 2023-10-14 ENCOUNTER — Other Ambulatory Visit (HOSPITAL_BASED_OUTPATIENT_CLINIC_OR_DEPARTMENT_OTHER): Payer: Self-pay

## 2023-10-14 MED ORDER — CLONAZEPAM 1 MG PO TABS
1.0000 mg | ORAL_TABLET | Freq: Three times a day (TID) | ORAL | 0 refills | Status: DC
  Filled 2023-10-27 (×2): qty 270, 90d supply, fill #0

## 2023-10-15 ENCOUNTER — Ambulatory Visit (HOSPITAL_BASED_OUTPATIENT_CLINIC_OR_DEPARTMENT_OTHER)
Admission: RE | Admit: 2023-10-15 | Discharge: 2023-10-15 | Disposition: A | Discharge status: Home / Self Care | Source: Ambulatory Visit | Attending: Pulmonary Disease | Admitting: Pulmonary Disease

## 2023-10-15 DIAGNOSIS — A319 Mycobacterial infection, unspecified: Secondary | ICD-10-CM | POA: Diagnosis not present

## 2023-10-15 DIAGNOSIS — A310 Pulmonary mycobacterial infection: Secondary | ICD-10-CM | POA: Diagnosis not present

## 2023-10-15 DIAGNOSIS — R918 Other nonspecific abnormal finding of lung field: Secondary | ICD-10-CM | POA: Diagnosis not present

## 2023-10-20 ENCOUNTER — Other Ambulatory Visit: Payer: Self-pay | Admitting: Family Medicine

## 2023-10-20 DIAGNOSIS — G250 Essential tremor: Secondary | ICD-10-CM

## 2023-10-21 ENCOUNTER — Other Ambulatory Visit (HOSPITAL_BASED_OUTPATIENT_CLINIC_OR_DEPARTMENT_OTHER): Payer: Self-pay

## 2023-10-21 MED ORDER — PROPRANOLOL HCL 20 MG PO TABS
20.0000 mg | ORAL_TABLET | Freq: Two times a day (BID) | ORAL | 0 refills | Status: DC
  Filled 2023-10-21: qty 180, 90d supply, fill #0

## 2023-10-26 ENCOUNTER — Other Ambulatory Visit (HOSPITAL_BASED_OUTPATIENT_CLINIC_OR_DEPARTMENT_OTHER): Payer: Self-pay

## 2023-10-27 ENCOUNTER — Other Ambulatory Visit (HOSPITAL_BASED_OUTPATIENT_CLINIC_OR_DEPARTMENT_OTHER): Payer: Self-pay

## 2023-10-27 ENCOUNTER — Other Ambulatory Visit: Payer: Self-pay

## 2023-11-18 ENCOUNTER — Encounter (HOSPITAL_BASED_OUTPATIENT_CLINIC_OR_DEPARTMENT_OTHER): Payer: Self-pay | Admitting: Primary Care

## 2023-11-18 ENCOUNTER — Ambulatory Visit (INDEPENDENT_AMBULATORY_CARE_PROVIDER_SITE_OTHER): Admitting: Primary Care

## 2023-11-18 VITALS — BP 98/59 | HR 55 | Ht 71.0 in | Wt 155.0 lb

## 2023-11-18 DIAGNOSIS — A310 Pulmonary mycobacterial infection: Secondary | ICD-10-CM | POA: Diagnosis not present

## 2023-11-18 DIAGNOSIS — R918 Other nonspecific abnormal finding of lung field: Secondary | ICD-10-CM | POA: Diagnosis not present

## 2023-11-18 NOTE — Patient Instructions (Signed)
 -  PULMONARY NODULES WITH BRONCHIECTASIS: Pulmonary nodules are small growths in the lungs, and bronchiectasis is a condition where the airways in the lungs become widened and scarred. Your nodules and bronchiectasis are consistent with a Mycobacterium avium-intracellulare (MAI) infection. Currently, your condition is stable and you are not experiencing symptoms like fever, cough, chest pain, or shortness of breath. We will follow up in one year with Dr. Kassie. In the meantime, monitor for any new symptoms such as purulent mucus, fevers, night sweats, chest pain, or chronic cough. We will also send a message to Dr. Kassie to determine if further imaging is needed before your next visit. No invasive testing like bronchoscopy is needed unless you develop symptoms.  INSTRUCTIONS:  Please follow up with Dr. Kassie in one year. Monitor for any new symptoms such as purulent mucus, fevers, night sweats, chest pain, or chronic cough. We will send a message to Dr. Kassie to determine if further imaging is needed before your next visit.  Follow-up 1 year with Dr. Kassie or sooner if needed

## 2023-11-18 NOTE — Progress Notes (Signed)
 @Patient  ID: Angela Ray, female    DOB: August 09, 1950, 73 y.o.   MRN: [id number]  No chief complaint on file.   Referring provider: Copland, [name], MD  HPI: 73 year old female never smoker with GERD, IBS, essential tremor who presents for follow-up. Reviewed CT scan in clinic together. Addressed questions and concerns. Overall stable nodular disease consistent with chronic MAC. Asymptomatic. Reviewed course of management if she were to become symptomatic including bronchoscopy to re-confirm MAC persistent and prolonged antibiotic course.   #Upper lung nodules  #Atypical CT findings/+MAC - asymptomatic -Reviewed CT with stable nodular disease and bronchiectasis -No indication for  treatment -Hold off on bronchoscopy with BAL unless patient develops persistent symptoms  -ORDER CT Chest without contrast in 1 year   #COVID long hauler with memory issues - improving -Encourage regular activity  -Encourage regular social activity  -Encourage patient to keep diary with places, locations, and names for any memory issues   11/18/2023- Interim hx  Discussed the use of AI scribe software for clinical note transcription with the patient, who gave verbal consent to proceed.  History of Present Illness   Angela Ray is a 73 year old female with pulmonary nodules and MAC disease who presents for a one year follow-up pulmonary nodules and MAC disease.  She has been under monitoring with CT imaging for her pulmonary nodules and MAC disease. Currently, she has no significant symptoms. She experiences no fever, cough, chest pain, or shortness of breath unless exposed to heat for extended periods. There is no production of colored sputum, and her daily activities are not limited. She occasionally uses Mucinex to manage nasal drainage.    Allergies  Allergen Reactions   Aciphex [Rabeprazole Sodium] Other (See Comments)    REACTION: FELT LIKE INDIGESTION   Bupropion Hcl Other  (See Comments)    REACTION: DIDN'T MAKE ME FEEL RIGHT IN THE HEAD   Cephalexin Nausea Only and Other (See Comments)    REACTION: GAVE YEAST INFECTION   Codeine Other (See Comments)    REACTION: SEVERE NAUSEA, HEART PALPITATION, PASSING OUT, HEART ATTACK LIKE SYMPTOMS   Diclofenac  Other (See Comments)    Lowered O2 level per pt   Esomeprazole Magnesium Other (See Comments)    Nexium=REACTION:  CAUSE TASTE BUDS TO FALL OUT   Methadone Palpitations    All opioids: Other Reaction(s): GI Intolerance, Respiratory Distress   Sertraline Hcl Other (See Comments)    REACTION: DIDN'T MAKE ME FEEL RIGHT IN THE HEAD   Prednisone Other (See Comments)    Suicidal thoughts   Doxycycline Hyclate Nausea Only   Linaclotide  Diarrhea    Immunization History  Administered Date(s) Administered   Influenza, High Dose Seasonal PF 03/01/2018, 02/15/2019, 02/07/2020, 02/26/2021, 02/06/2022   Influenza,inj,Quad PF,6+ Mos 02/10/2017   Influenza-Unspecified 03/12/2016   Moderna Sars-Covid-2 Vaccination 05/10/2019, 06/07/2019, 06/18/2019   Pneumococcal Conjugate-13 04/19/2018   Pneumococcal-Unspecified 05/12/2014   Tdap 05/12/2014, 11/24/2022   Zoster Recombinant(Shingrix) 11/08/2019, 01/16/2020   Zoster, Live 05/12/2014    Past Medical History:  Diagnosis Date   Allergy 2002   Anxiety    Barrett's esophagus    in the past   Colon polyp    Depression    Dry eye syndrome of both eyes    Dupuytren's contracture of both hands 10/2016   sees Dr.Gramig--[facility]   Essential tremor 2016   legs, arm,lower jaw   Female bladder prolapse    Fibroid    Fibromyalgia  GERD (gastroesophageal reflux disease)    Heat exhaustion    Herniated disc, cervical    C3   Hiatal hernia    History of cardiovascular stress test    Lexiscan  Myoview 7/16:  EF 65%, inferolateral defect (prob artifactual), no ischemia   History of recurrent UTIs    Hyperlipidemia    IBS (irritable bowel syndrome)     constipation   Leg cramps    Low back pain    Lumbar herniated disc    L4,L5   Osteoarthritis    Osteopenia    Osteoporosis    Osteopenia per pt   Rheumatoid arthritis, adult (HCC) 2016   both feet   Skin cancer    Spinal stenosis    lumbar   Tremors of nervous system    Vertigo     Tobacco History: Social History   Tobacco Use  Smoking Status Never  Smokeless Tobacco Never   Counseling given: Not Answered   Outpatient Medications Prior to Visit  Medication Sig Dispense Refill   Azelaic Acid 15 % cream Apply 1 application topically every morning.     busPIRone  (BUSPAR ) 30 MG tablet Take 1 tablet (30 mg total) by mouth 2 (two) times daily. 180 tablet 3   Ca Phosphate-Cholecalciferol (CALTRATE GUMMY BITES) 250-10 MG-MCG CHEW Chew by mouth. (Patient not taking: Reported on 09/17/2023)     cetirizine (ZYRTEC) 10 MG tablet Take 10 mg by mouth daily as needed for allergies.      clonazePAM  (KLONOPIN ) 1 MG tablet Take 1 tablet (1 mg total) by mouth 3 (three) times daily. 270 tablet 0   Cyanocobalamin  (B-12) 2500 MCG TABS Take by mouth daily. (Patient not taking: Reported on 09/17/2023)     cyclobenzaprine  (FLEXERIL ) 10 MG tablet TAKE 1 TABLET BY MOUTH TWICE A DAY AS NEEDED FOR MUSCLE SPASMS 30 tablet 0   estradiol  (ESTRACE ) 0.1 MG/GM vaginal cream Apply a pea sized amount daily. 42.5 g 11   fluticasone  (FLONASE ) 50 MCG/ACT nasal spray Place 2 sprays into both nostrils at bedtime as needed for allergies. 48 g 4   ibuprofen  (ADVIL ) 600 MG tablet Take 1 tablet (600 mg total) by mouth every 8 (eight) hours as needed (pain). 40 tablet 1   ipratropium (ATROVENT ) 0.06 % nasal spray Place 2 sprays into both nostrils 4 (four) times daily. 45 mL 1   lidocaine  (XYLOCAINE ) 5 % ointment Apply daily as needed     Lifitegrast  (XIIDRA ) 5 % SOLN Apply 1 drop to eye daily.     Lifitegrast  (XIIDRA ) 5 % SOLN Place 1 drop into both eyes 2 (two) times daily. (Patient not taking: Reported on 09/17/2023) 180  each 0   metroNIDAZOLE  (METROCREAM ) 0.75 % cream Apply topically daily as needed. (Patient not taking: Reported on 09/17/2023) 45 g 1   Multiple Vitamins-Minerals (HAIR/SKIN/NAILS/BIOTIN PO) Take by mouth daily. (Patient not taking: Reported on 09/17/2023)     OVER THE COUNTER MEDICATION Take 1 capsule by mouth 3 (three) times daily. Hydro eye     pantoprazole  (PROTONIX ) 40 MG tablet Take 1 tablet (40 mg total) by mouth daily. 90 tablet 3   PARoxetine  (PAXIL ) 20 MG tablet Take 1 tablet (20 mg total) by mouth 2 (two) times daily. 180 tablet 3   propranolol  (INDERAL ) 20 MG tablet Take 1 tablet (20 mg total) by mouth 2 (two) times daily. 180 tablet 0   traMADol  (ULTRAM ) 50 MG tablet Take 1 tablet (50 mg total) by mouth every 8 (eight) hours  as needed. 20 tablet 0   traZODone  (DESYREL ) 50 MG tablet Take 1-3 tablets (50-150 mg total) by mouth at bedtime as needed. 270 tablet 3   tretinoin (RETIN-A) 0.025 % cream Apply 1 application topically at bedtime.     trimethoprim  (TRIMPEX ) 100 MG tablet Take 1 tablet (100 mg total) by mouth daily. 90 tablet 3   Facility-Administered Medications Prior to Visit  Medication Dose Route Frequency Provider Last Rate Last Admin   bisacodyl  (DULCOLAX) EC tablet 5 mg  5 mg Oral Daily PRN Nandigam, Kavitha V, MD       Review of Systems  Review of Systems  Constitutional:  Negative for fever.  HENT: Negative.    Respiratory: Negative.  Negative for cough, chest tightness, shortness of breath and wheezing.   Cardiovascular: Negative.    Physical Exam  LMP 02/09/2002 (Approximate)  Physical Exam Constitutional:      General: She is not in acute distress.    Appearance: Normal appearance. She is not ill-appearing.  HENT:     Head: Normocephalic and atraumatic.     Mouth/Throat:     Mouth: Mucous membranes are moist.     Pharynx: Oropharynx is clear.  Cardiovascular:     Rate and Rhythm: Normal rate and regular rhythm.  Pulmonary:     Effort: Pulmonary effort  is normal.     Breath sounds: Normal breath sounds.     Comments: CTA Musculoskeletal:        General: Normal range of motion.  Skin:    General: Skin is warm and dry.  Neurological:     General: No focal deficit present.     Mental Status: She is alert and oriented to person, place, and time. Mental status is at baseline.  Psychiatric:        Mood and Affect: Mood normal.        Behavior: Behavior normal.        Thought Content: Thought content normal.        Judgment: Judgment normal.      Lab Results:  CBC    Component Value Date/Time   WBC 4.1 08/03/2023 1112   RBC 4.17 08/03/2023 1112   HGB 13.5 08/03/2023 1112   HGB 13.6 03/06/2017 1106   HGB 13.6 02/02/2015 0854   HCT 39.3 08/03/2023 1112   HCT 41.5 03/06/2017 1106   PLT 224.0 08/03/2023 1112   PLT 240 03/06/2017 1106   MCV 94.4 08/03/2023 1112   MCV 96 03/06/2017 1106   MCH 32.2 05/05/2022 0642   MCHC 34.4 08/03/2023 1112   RDW 13.1 08/03/2023 1112   RDW 13.2 03/06/2017 1106   LYMPHSABS 1.5 02/03/2023 1608   MONOABS 0.6 02/03/2023 1608   EOSABS 0.1 02/03/2023 1608   BASOSABS 0.1 02/03/2023 1608    BMET    Component Value Date/Time   NA 144 08/03/2023 1112   NA 143 03/06/2017 1106   K 4.1 08/03/2023 1112   CL 105 08/03/2023 1112   CO2 31 08/03/2023 1112   GLUCOSE 96 08/03/2023 1112   BUN 5 (L) 08/03/2023 1112   BUN 14 03/06/2017 1106   CREATININE 0.78 08/03/2023 1112   CREATININE 0.75 02/22/2016 1347   CALCIUM 9.3 08/03/2023 1112   GFRNONAA >60 05/05/2022 0642   GFRAA >60 10/23/2018 2109    BNP No results found for: BNP  ProBNP    Component Value Date/Time   PROBNP 104.0 (H) 08/03/2023 1112    Imaging: No results found.   Assessment &  Plan:   1. MAI (mycobacterium avium-intracellulare) (HCC) (Primary)  2. Multiple nodules of lung  Assessment and Plan    Pulmonary nodules with bronchiectasis Chronic stable pulmonary nodules with mild bronchiectasis in the right upper and  middle lobes, consistent with Mycobacterium avium-intracellulare (MAI) infection. She is asymptomatic with no fever, cough, chest pain, or dyspnea. - Follow up in one year with Dr. Kassie. - Monitor for symptoms such as purulent mucus, fevers, night sweats, chest pain, or chronic cough. - Send a message to Dr. Kassie regarding the need for further imaging prior to the next visit. - Hold off on bronchoscopy unless symptoms develop.  Almarie LELON Ferrari, NP 11/18/2023

## 2023-11-27 ENCOUNTER — Other Ambulatory Visit: Payer: Self-pay | Admitting: Family Medicine

## 2023-11-27 ENCOUNTER — Other Ambulatory Visit (HOSPITAL_BASED_OUTPATIENT_CLINIC_OR_DEPARTMENT_OTHER): Payer: Self-pay

## 2023-11-27 DIAGNOSIS — G250 Essential tremor: Secondary | ICD-10-CM

## 2023-11-27 MED ORDER — PROPRANOLOL HCL 20 MG PO TABS
20.0000 mg | ORAL_TABLET | Freq: Two times a day (BID) | ORAL | 0 refills | Status: DC
  Filled 2023-11-27 – 2024-01-18 (×3): qty 180, 90d supply, fill #0

## 2023-11-29 ENCOUNTER — Ambulatory Visit: Payer: Self-pay | Admitting: Adult Health

## 2023-11-29 DIAGNOSIS — A310 Pulmonary mycobacterial infection: Secondary | ICD-10-CM

## 2023-12-02 ENCOUNTER — Other Ambulatory Visit (HOSPITAL_BASED_OUTPATIENT_CLINIC_OR_DEPARTMENT_OTHER): Payer: Self-pay

## 2023-12-05 ENCOUNTER — Other Ambulatory Visit (HOSPITAL_BASED_OUTPATIENT_CLINIC_OR_DEPARTMENT_OTHER): Payer: Self-pay

## 2023-12-05 ENCOUNTER — Other Ambulatory Visit: Payer: Self-pay

## 2023-12-07 ENCOUNTER — Other Ambulatory Visit (HOSPITAL_BASED_OUTPATIENT_CLINIC_OR_DEPARTMENT_OTHER): Payer: Self-pay

## 2023-12-07 ENCOUNTER — Other Ambulatory Visit: Payer: Self-pay

## 2023-12-08 ENCOUNTER — Encounter: Payer: Self-pay | Admitting: Gastroenterology

## 2023-12-08 ENCOUNTER — Ambulatory Visit (INDEPENDENT_AMBULATORY_CARE_PROVIDER_SITE_OTHER): Admitting: Gastroenterology

## 2023-12-08 VITALS — BP 110/72 | HR 53 | Ht 71.0 in | Wt 157.5 lb

## 2023-12-08 DIAGNOSIS — K5909 Other constipation: Secondary | ICD-10-CM | POA: Diagnosis not present

## 2023-12-08 DIAGNOSIS — R1084 Generalized abdominal pain: Secondary | ICD-10-CM

## 2023-12-08 DIAGNOSIS — R151 Fecal smearing: Secondary | ICD-10-CM

## 2023-12-08 DIAGNOSIS — K582 Mixed irritable bowel syndrome: Secondary | ICD-10-CM

## 2023-12-08 DIAGNOSIS — R159 Full incontinence of feces: Secondary | ICD-10-CM | POA: Diagnosis not present

## 2023-12-08 NOTE — Progress Notes (Signed)
 Angela Ray    [id number]    02-28-1951  Primary Care Physician:Copland, Angela BROCKS, MD  Referring Physician: Watt Angela BROCKS, MD [address] [place],  [place] [address]   Chief complaint: Fecal incontinence, diarrhea and incomplete evacuation  Discussed the use of AI scribe software for clinical note transcription with the patient, who gave verbal consent to proceed.  History of Present Illness Angela Ray is a 73 year old female who presents with bowel irregularities and constipation following a colonoscopy.  Altered bowel habits - Significant bowel irregularities since colonoscopy - Severe constipation lasting up to ten days despite use of stool softeners and laxatives (Colace, Dulcolax, Miralax ) - Episodes of constipation followed by sudden, uncontrollable liquid stools - Requires disposable underwear at night and pads during the day due to fecal incontinence - No urge to have a bowel movement - Stool described as 'loose pudding' or 'baby diarrhea' - Observation of whole berries in stool, indicating incomplete digestion - History of chronic constipation with change in bowel habits since colonoscopy  Abdominal and back pain - Intermittent abdominal pain - Back pain associated with bowel issues, subsiding after a normal bowel movement - No fever, nausea, or vomiting  Bowel incontinence and hygiene measures - Fecal incontinence requiring use of disposable underwear at night and pads during the day - Uses baby wipes to maintain cleanliness due to liquid stools  Physical activity and supportive measures - Previously underwent physical therapy for muscular therapy with benefit - Continues to perform exercises taught by therapist - Engages in regular walking and follows exercises to aid bowel movements    EGD 08/10/23 - Z- line regular, 40 cm from the incisors. - Normal esophagus. - No gross lesions in the entire stomach. -  Normal examined duodenum.  Colonoscopy 07/31/23 - One 5 mm polyp in the cecum, removed with a cold snare. Resected and retrieved. - Non- bleeding external and internal hemorrhoids.  1. Surgical [P], colon, cecum, polyp (1) :       - SESSILE SERRATED POLYP.       - NO DYSPLASIA OR MALIGNANCY.   Outpatient Encounter Medications as of 12/08/2023  Medication Sig   Azelaic Acid 15 % cream Apply 1 application topically every morning.   busPIRone  (BUSPAR ) 30 MG tablet Take 1 tablet (30 mg total) by mouth 2 (two) times daily.   cetirizine (ZYRTEC) 10 MG tablet Take 10 mg by mouth daily as needed for allergies.    clonazePAM  (KLONOPIN ) 1 MG tablet Take 1 tablet (1 mg total) by mouth 3 (three) times daily.   cyclobenzaprine  (FLEXERIL ) 10 MG tablet TAKE 1 TABLET BY MOUTH TWICE A DAY AS NEEDED FOR MUSCLE SPASMS   estradiol  (ESTRACE ) 0.1 MG/GM vaginal cream Apply a pea sized amount daily.   fluticasone  (FLONASE ) 50 MCG/ACT nasal spray Place 2 sprays into both nostrils at bedtime as needed for allergies.   ibuprofen  (ADVIL ) 600 MG tablet Take 1 tablet (600 mg total) by mouth every 8 (eight) hours as needed (pain).   ipratropium (ATROVENT ) 0.06 % nasal spray Place 2 sprays into both nostrils 4 (four) times daily.   lidocaine  (XYLOCAINE ) 5 % ointment Apply daily as needed   Lifitegrast  (XIIDRA ) 5 % SOLN Place 1 drop into both eyes 2 (two) times daily.   metroNIDAZOLE  (METROCREAM ) 0.75 % cream Apply topically daily as needed.   OVER THE COUNTER MEDICATION Take 1 capsule by mouth  3 (three) times daily. Hydro eye   PARoxetine  (PAXIL ) 20 MG tablet Take 1 tablet (20 mg total) by mouth 2 (two) times daily.   propranolol  (INDERAL ) 20 MG tablet Take 1 tablet (20 mg total) by mouth 2 (two) times daily.   traMADol  (ULTRAM ) 50 MG tablet Take 1 tablet (50 mg total) by mouth every 8 (eight) hours as needed.   traZODone  (DESYREL ) 50 MG tablet Take 1-3 tablets (50-150 mg total) by mouth at bedtime as needed.   tretinoin  (RETIN-A) 0.025 % cream Apply 1 application topically at bedtime.   trimethoprim  (TRIMPEX ) 100 MG tablet Take 1 tablet (100 mg total) by mouth daily.   pantoprazole  (PROTONIX ) 40 MG tablet Take 1 tablet (40 mg total) by mouth daily. (Patient not taking: Reported on 11/18/2023)   [DISCONTINUED] Ca Phosphate-Cholecalciferol (CALTRATE GUMMY BITES) 250-10 MG-MCG CHEW Chew by mouth.   [DISCONTINUED] Cyanocobalamin  (B-12) 2500 MCG TABS Take by mouth daily. (Patient not taking: Reported on 11/18/2023)   [DISCONTINUED] Lifitegrast  (XIIDRA ) 5 % SOLN Apply 1 drop to eye daily. (Patient not taking: Reported on 11/18/2023)   [DISCONTINUED] Multiple Vitamins-Minerals (HAIR/SKIN/NAILS/BIOTIN PO) Take by mouth daily.   Facility-Administered Encounter Medications as of 12/08/2023  Medication   bisacodyl  (DULCOLAX) EC tablet 5 mg    Allergies as of 12/08/2023 - Review Complete 12/08/2023  Allergen Reaction Noted   Aciphex [rabeprazole sodium] Other (See Comments) 06/24/2011   Bupropion hcl Other (See Comments) 05/19/2006   Cephalexin Nausea Only and Other (See Comments) 05/19/2006   Codeine Other (See Comments) 06/24/2011   Diclofenac  Other (See Comments) 07/31/2023   Esomeprazole magnesium Other (See Comments) 06/24/2011   Methadone Palpitations 01/30/2015   Sertraline hcl Other (See Comments) 05/19/2006   Prednisone Other (See Comments) 05/20/2021   Doxycycline hyclate Nausea Only 05/19/2006   Linaclotide  Diarrhea 11/07/2019    Past Medical History:  Diagnosis Date   Allergy 2002   Anxiety    Barrett's esophagus    in the past   Colon polyp    Depression    Dry eye syndrome of both eyes    Dupuytren's contracture of both hands 10/2016   sees Dr.Gramig--[facility]   Essential tremor 2016   legs, arm,lower jaw   Female bladder prolapse    Fibroid    Fibromyalgia    GERD (gastroesophageal reflux disease)    Heat exhaustion    Herniated disc, cervical    C3   Hiatal hernia    History of  cardiovascular stress test    Lexiscan  Myoview 7/16:  EF 65%, inferolateral defect (prob artifactual), no ischemia   History of recurrent UTIs    Hyperlipidemia    IBS (irritable bowel syndrome)    constipation   Leg cramps    Low back pain    Lumbar herniated disc    L4,L5   Osteoarthritis    Osteopenia    Osteoporosis    Osteopenia per pt   Rheumatoid arthritis, adult (HCC) 2016   both feet   Skin cancer    Spinal stenosis    lumbar   Tremors of nervous system    Vertigo     Past Surgical History:  Procedure Laterality Date   ABDOMINAL HYSTERECTOMY  2003   TAH still has ovaries--Dr. Nikki   ANAL RECTAL MANOMETRY [name] 12/23/2019   Procedure: ANO RECTAL MANOMETRY;  Surgeon: Avaya Mcjunkins V, MD;  Location: [facility];  Service: Endoscopy;  Laterality: [name];   CERVICAL LAMINECTOMY  2002   C5/C6  CHOLECYSTECTOMY  2008   fractured ankle Left 10/2014   ROTATOR CUFF REPAIR Right 2011    Family History  Problem Relation Age of Onset   Atrial fibrillation Mother    Multiple myeloma Mother    Skin cancer Mother    Coronary artery disease Mother    Irritable bowel syndrome Mother    Coronary artery disease Father 36       CABG 2002, followd by Dr, Micky   Prostate cancer Father    Bladder Cancer Father    Skin cancer Father    Diabetes Father 76       DM type 2   Neuropathy Father    Diabetes Maternal Grandmother    Hypertension Maternal Grandmother    Diabetes Maternal Grandfather    Hypertension Maternal Grandfather    Osteoarthritis Paternal Grandmother    Cancer Maternal Aunt        bone cancer   Colon cancer Neg Hx    Esophageal cancer Neg Hx    Stomach cancer Neg Hx    Rectal cancer Neg Hx    Colon polyps Neg Hx     Social History   Socioeconomic History   Marital status: Married    Spouse name: Not on file   Number of children: 0   Years of education: 12   Highest education level: 12th grade  Occupational History   Occupation: Retired     Associate Professor: RETIRED  Tobacco Use   Smoking status: Never   Smokeless tobacco: Never  Vaping Use   Vaping status: Never Used  Substance and Sexual Activity   Alcohol use: No    Alcohol/week: 0.0 standard drinks of alcohol    Comment: occ glass of wine   Drug use: No   Sexual activity: Not Currently    Partners: Male    Birth control/protection: Surgical    Comment: TAH still has ovaries  Other Topics Concern   Not on file  Social History Narrative   Lives at home with her husband.   Right-handed.   Rarely uses caffeine.   Social Drivers of Corporate investment banker Strain: Low Risk  (05/29/2023)   Overall Financial Resource Strain (CARDIA)    Difficulty of Paying Living Expenses: Not hard at all  Food Insecurity: No Food Insecurity (05/29/2023)   Hunger Vital Sign    Worried About Running Out of Food in the Last Year: Never true    Ran Out of Food in the Last Year: Never true  Transportation Needs: No Transportation Needs (05/29/2023)   PRAPARE - Administrator, Civil Service (Medical): No    Lack of Transportation (Non-Medical): No  Physical Activity: Insufficiently Active (05/29/2023)   Exercise Vital Sign    Days of Exercise per Week: 3 days    Minutes of Exercise per Session: 20 min  Stress: No Stress Concern Present (05/29/2023)   [employer] of Occupational Health - Occupational Stress Questionnaire    Feeling of Stress : Only a little  Social Connections: Unknown (05/29/2023)   Social Connection and Isolation Panel    Frequency of Communication with Friends and Family: More than three times a week    Frequency of Social Gatherings with Friends and Family: Once a week    Attends Religious Services: Patient declined    Active Member of Clubs or Organizations: No    Attends Banker Meetings: Not on file    Marital Status: Married  Intimate Partner Violence: Unknown (11/24/2022)  Received from [facility]   HITS    Physically Hurt: Not  on file    Insult or Talk Down To: Not on file    Threaten Physical Harm: Not on file    Scream or Curse: Not on file      Review of systems: All other review of systems negative except as mentioned in the HPI.   Physical Exam: Vitals:   12/08/23 1457  BP: 110/72  Pulse: (!) 53   Body mass index is 21.97 kg/m. Gen:      No acute distress HEENT:  sclera anicteric Abd:      soft, mild right-sided tenderness  Ext:    No edema Neuro: alert and oriented x 3 Psych: normal mood and affect Rectal exam: Decreased anal sphincter tone, no anal fissure. Data Reviewed:  Reviewed labs, radiology imaging, old records and pertinent past GI work up     Assessment and Plan Assessment & Plan 73 year old very pleasant female with chronic history of irritable bowel syndrome with alternating constipation and diarrhea, pelvic floor dysfunction/dyssynergy defecation and fecal incontinence  Chronic Constipation with Fecal Incontinence Chronic constipation with fecal incontinence post-colonoscopy. Severe constipation persists up to ten days despite stool softeners and laxatives (Colace, Dulcolax, Miralax ). Unpredictable liquid stool episodes lead to incontinence. Examination indicates incomplete evacuation with colonic stool retention. Hemorrhoids and pelvic floor dysfunction contribute to symptoms. Previous pelvic floor physical therapy provided some improvement. Current symptoms likely due to incomplete evacuation and pelvic floor dysfunction. Discussed stronger laxative regimen and pelvic floor physical therapy to enhance rectal relaxation and evacuation. Potential InterStim therapy if incontinence persists. - Initiate stronger laxative regimen for predictable bowel movements. - Recommend pelvic floor physical therapy focusing on rectal relaxation and evacuation. - Prescribe Benefiber, one tablespoon with every meal, to bulk stool. - Provide Linaclotide  145 mcg samples, to be taken once daily on  an empty stomach. - Advise bowel cleanse similar to colonoscopy preparation to clear retained stool. - Consider CT scan if symptoms do not improve to rule out other causes. - Discuss InterStim therapy option if incontinence persists.  Abdominal Pain Intermittent right-sided abdominal pain, possibly related to colonic stool retention. Pain may also contribute to back pain, improving post-bowel movements. Differential includes musculoskeletal pain versus constipation-related pain. CT scan may be necessary if pain persists to rule out other causes. - Monitor abdominal pain and reassess if persistent. - Consider CT scan if pain does not improve with bowel regimen.     The patient was provided an opportunity to ask questions and all were answered. The patient agreed with the plan and demonstrated an understanding of the instructions.  Angela Ray , MD    CC: Copland, Angela BROCKS, MD

## 2023-12-08 NOTE — Patient Instructions (Addendum)
 VISIT SUMMARY:  Today, we discussed your ongoing bowel irregularities and constipation following your colonoscopy. We also addressed your abdominal and back pain, as well as your fecal incontinence.  YOUR PLAN:  CHRONIC CONSTIPATION WITH FECAL INCONTINENCE: You have been experiencing severe constipation and unpredictable liquid stools since your colonoscopy, leading to fecal incontinence. -Start a stronger laxative regimen to help regulate your bowel movements. -Continue pelvic floor physical therapy to improve rectal relaxation and evacuation. -Take one tablespoon of Benefiber with every meal to bulk up your stool. -Take Linaclotide  145 mcg once daily on an empty stomach. Samples have been provided. -Perform a bowel cleanse similar to the preparation for a colonoscopy to clear retained stool. -If symptoms do not improve, we may consider a CT scan to rule out other causes. -If incontinence persists, we can discuss the option of InterStim therapy.  ABDOMINAL PAIN: You have intermittent right-sided abdominal pain, which may be related to colonic stool retention or musculoskeletal issues. -Monitor your abdominal pain and let us  know if it persists. -If the pain does not improve with the new bowel regimen, we may consider a CT scan to rule out other causes.  Dr Shila recommends that you complete a bowel purge (to clean out your bowels). Please do the following: Purchase a bottle of Miralax  over the counter as well as a box of 5 mg dulcolax tablets. Take 4 dulcolax tablets. Wait 1 hour. You will then drink 6-8 capfuls of Miralax  mixed in an adequate amount of water/juice/gatorade (you may choose which of these liquids to drink) over the next 2-3 hours. You should expect results within 1 to 6 hours after completing the bowel purge.   I appreciate the  opportunity to care for you  Thank You   [name] , MD

## 2023-12-15 ENCOUNTER — Other Ambulatory Visit: Payer: Self-pay | Admitting: Pulmonary Disease

## 2023-12-15 ENCOUNTER — Other Ambulatory Visit (HOSPITAL_BASED_OUTPATIENT_CLINIC_OR_DEPARTMENT_OTHER): Payer: Self-pay

## 2023-12-16 ENCOUNTER — Other Ambulatory Visit (HOSPITAL_BASED_OUTPATIENT_CLINIC_OR_DEPARTMENT_OTHER): Payer: Self-pay

## 2023-12-16 MED ORDER — IPRATROPIUM BROMIDE 0.06 % NA SOLN
2.0000 | Freq: Four times a day (QID) | NASAL | 1 refills | Status: AC
  Filled 2023-12-16: qty 45, 11d supply, fill #0
  Filled 2024-04-19: qty 45, 11d supply, fill #1

## 2023-12-18 ENCOUNTER — Encounter: Payer: Self-pay | Admitting: Family Medicine

## 2023-12-18 ENCOUNTER — Other Ambulatory Visit (HOSPITAL_BASED_OUTPATIENT_CLINIC_OR_DEPARTMENT_OTHER): Payer: Self-pay

## 2023-12-18 ENCOUNTER — Ambulatory Visit (INDEPENDENT_AMBULATORY_CARE_PROVIDER_SITE_OTHER): Admitting: Family Medicine

## 2023-12-18 VITALS — BP 118/68 | HR 63 | Temp 98.0°F | Resp 16 | Ht 71.0 in | Wt 155.0 lb

## 2023-12-18 DIAGNOSIS — M4722 Other spondylosis with radiculopathy, cervical region: Secondary | ICD-10-CM | POA: Diagnosis not present

## 2023-12-18 DIAGNOSIS — M545 Low back pain, unspecified: Secondary | ICD-10-CM | POA: Diagnosis not present

## 2023-12-18 MED ORDER — CYCLOBENZAPRINE HCL 10 MG PO TABS
ORAL_TABLET | ORAL | 0 refills | Status: AC
  Filled 2023-12-18: qty 30, 15d supply, fill #0

## 2023-12-18 NOTE — Patient Instructions (Addendum)

## 2023-12-18 NOTE — Addendum Note (Signed)
 Addended by: TRECIA ALMARIE GRADE on: 12/18/2023 04:51 PM   Modules accepted: Orders

## 2023-12-18 NOTE — Progress Notes (Signed)
 Musculoskeletal Exam  Patient: Angela Ray DOB: 01-Jan-1951  DOS: 12/18/2023  SUBJECTIVE:  Chief Complaint:   Chief Complaint  Patient presents with   Back Pain    Back Pain    Angela Ray is a 73 y.o.  female for evaluation and treatment of flank pain.   Onset:  3 months ago. No inj or change in activity.  Location: b/l flank region Character:  shooting, achy when she is not moving Progression of issue:  has improved Associated symptoms: had a recent colon cleanse no urinary complaints or bleeding, fevers, diarrhea.  She did pass a kidney stone years ago. Treatment: to date has been BioFreeze, topical lidocaine .   Neurovascular symptoms: no  Past Medical History:  Diagnosis Date   Allergy 2002   Anxiety    Barrett's esophagus    in the past   Colon polyp    Depression    Dry eye syndrome of both eyes    Dupuytren's contracture of both hands 10/2016   sees Dr.Gramig--[facility]   Essential tremor 2016   legs, arm,lower jaw   Female bladder prolapse    Fibroid    Fibromyalgia    GERD (gastroesophageal reflux disease)    Heat exhaustion    Herniated disc, cervical    C3   Hiatal hernia    History of cardiovascular stress test    Lexiscan  Myoview 7/16:  EF 65%, inferolateral defect (prob artifactual), no ischemia   History of recurrent UTIs    Hyperlipidemia    IBS (irritable bowel syndrome)    constipation   Leg cramps    Low back pain    Lumbar herniated disc    L4,L5   Osteoarthritis    Osteopenia    Osteoporosis    Osteopenia per pt   Rheumatoid arthritis, adult (HCC) 2016   both feet   Skin cancer    Spinal stenosis    lumbar   Tremors of nervous system    Vertigo     Objective: VITAL SIGNS: BP 118/68 (BP Location: Left Arm, Patient Position: Sitting)   Pulse 63   Temp 98 F (36.7 C) (Oral)   Resp 16   Ht 5' 11 (1.803 m)   Wt 155 lb (70.3 kg)   LMP 02/09/2002 (Approximate)   SpO2 98%   BMI 21.62 kg/m  Constitutional:  Well formed, well developed. No acute distress. Heart: RRR Thorax & Lungs: CTAB. No accessory muscle use Musculoskeletal: Mid/low back.   Tenderness to palpation: Yes over the erector spinae muscle group on the right Deformity: no Ecchymosis: no Tests positive: None Tests negative: Straight leg, Lloyd's sign Neurologic: Normal sensory function. No focal deficits noted. DTR's equal and symmetric in LE's. No clonus. Psychiatric: Normal mood. Age appropriate judgment and insight. Alert & oriented x 3.    Assessment:  Acute right-sided low back pain without sciatica - Plan: Urine Microscopic Only  Cervical radiculopathy due to degenerative joint disease of spine - Plan: cyclobenzaprine  (FLEXERIL ) 10 MG tablet  Plan: Seems less likely to be a kidney stone or pyelonephritis.  Will check a urine sample to be on the safe side.  Stretches/exercises, heat, ice, Tylenol.  Will refill Flexeril  to use on an as needed basis as she gets thru the situation.  She has tolerated this well in the past.  If no improvement, would consider referral to physical therapy. F/u with regular PCP as originally scheduled. The patient voiced understanding and agreement to the plan.   [employer]  Deward Pry, DO 12/18/23  4:43 PM

## 2023-12-19 ENCOUNTER — Ambulatory Visit: Payer: Self-pay | Admitting: Family Medicine

## 2023-12-19 LAB — URINALYSIS, MICROSCOPIC ONLY
Bacteria, UA: NONE SEEN /HPF
Hyaline Cast: NONE SEEN /LPF
RBC / HPF: NONE SEEN /HPF (ref 0–2)

## 2023-12-23 ENCOUNTER — Telehealth: Payer: Self-pay | Admitting: Gastroenterology

## 2023-12-23 ENCOUNTER — Other Ambulatory Visit (HOSPITAL_BASED_OUTPATIENT_CLINIC_OR_DEPARTMENT_OTHER): Payer: Self-pay

## 2023-12-23 MED ORDER — LINACLOTIDE 145 MCG PO CAPS
145.0000 ug | ORAL_CAPSULE | Freq: Every day | ORAL | 3 refills | Status: DC
  Filled 2023-12-23: qty 30, 30d supply, fill #0
  Filled 2024-01-18: qty 30, 30d supply, fill #1

## 2023-12-23 NOTE — Telephone Encounter (Signed)
 Patient called and stated that she was given samples of Linzess  and was told to call back to let us  know if they are working for her in order to prescribed that medication to her. Patient stated that the medication has been working and would like the Linzess  to go to the pharmacy over at the med center. Please advise.

## 2023-12-23 NOTE — Telephone Encounter (Signed)
 Linzess  145 mcg samples provided to patient during her last ov. Orders placed. Medication is not on formulary. Updated patient that a prior authorization may be required. Patient verbalized understanding.

## 2023-12-24 ENCOUNTER — Other Ambulatory Visit (HOSPITAL_BASED_OUTPATIENT_CLINIC_OR_DEPARTMENT_OTHER): Payer: Self-pay

## 2023-12-30 DIAGNOSIS — Z1231 Encounter for screening mammogram for malignant neoplasm of breast: Secondary | ICD-10-CM | POA: Diagnosis not present

## 2023-12-30 LAB — HM MAMMOGRAPHY

## 2023-12-31 ENCOUNTER — Other Ambulatory Visit (HOSPITAL_BASED_OUTPATIENT_CLINIC_OR_DEPARTMENT_OTHER): Payer: Self-pay

## 2024-01-02 ENCOUNTER — Ambulatory Visit: Payer: Self-pay | Admitting: Obstetrics and Gynecology

## 2024-01-14 DIAGNOSIS — H2513 Age-related nuclear cataract, bilateral: Secondary | ICD-10-CM | POA: Diagnosis not present

## 2024-01-14 DIAGNOSIS — H43813 Vitreous degeneration, bilateral: Secondary | ICD-10-CM | POA: Diagnosis not present

## 2024-01-14 DIAGNOSIS — G43809 Other migraine, not intractable, without status migrainosus: Secondary | ICD-10-CM | POA: Diagnosis not present

## 2024-01-14 DIAGNOSIS — H40013 Open angle with borderline findings, low risk, bilateral: Secondary | ICD-10-CM | POA: Diagnosis not present

## 2024-01-18 ENCOUNTER — Other Ambulatory Visit: Payer: Self-pay

## 2024-01-18 ENCOUNTER — Other Ambulatory Visit (HOSPITAL_BASED_OUTPATIENT_CLINIC_OR_DEPARTMENT_OTHER): Payer: Self-pay

## 2024-01-18 MED ORDER — PAROXETINE HCL 20 MG PO TABS
20.0000 mg | ORAL_TABLET | Freq: Two times a day (BID) | ORAL | 0 refills | Status: DC
  Filled 2024-01-18: qty 180, 90d supply, fill #0

## 2024-01-26 ENCOUNTER — Other Ambulatory Visit (HOSPITAL_BASED_OUTPATIENT_CLINIC_OR_DEPARTMENT_OTHER): Payer: Self-pay

## 2024-01-26 ENCOUNTER — Other Ambulatory Visit: Payer: Self-pay

## 2024-01-26 MED ORDER — CLONAZEPAM 1 MG PO TABS
1.0000 mg | ORAL_TABLET | Freq: Three times a day (TID) | ORAL | 0 refills | Status: AC | PRN
  Filled 2024-01-26: qty 270, 90d supply, fill #0

## 2024-01-28 ENCOUNTER — Other Ambulatory Visit (HOSPITAL_BASED_OUTPATIENT_CLINIC_OR_DEPARTMENT_OTHER): Payer: Self-pay

## 2024-02-05 ENCOUNTER — Encounter: Payer: Self-pay | Admitting: Gastroenterology

## 2024-02-05 ENCOUNTER — Other Ambulatory Visit (HOSPITAL_BASED_OUTPATIENT_CLINIC_OR_DEPARTMENT_OTHER): Payer: Self-pay

## 2024-02-05 ENCOUNTER — Ambulatory Visit: Admitting: Gastroenterology

## 2024-02-05 VITALS — BP 90/60 | HR 93 | Ht 71.0 in | Wt 154.0 lb

## 2024-02-05 DIAGNOSIS — Z860101 Personal history of adenomatous and serrated colon polyps: Secondary | ICD-10-CM | POA: Diagnosis not present

## 2024-02-05 DIAGNOSIS — R1011 Right upper quadrant pain: Secondary | ICD-10-CM

## 2024-02-05 DIAGNOSIS — K582 Mixed irritable bowel syndrome: Secondary | ICD-10-CM | POA: Diagnosis not present

## 2024-02-05 DIAGNOSIS — R159 Full incontinence of feces: Secondary | ICD-10-CM

## 2024-02-05 MED ORDER — LINACLOTIDE 145 MCG PO CAPS
145.0000 ug | ORAL_CAPSULE | Freq: Every day | ORAL | 3 refills | Status: AC
  Filled 2024-02-05 – 2024-03-05 (×2): qty 90, 90d supply, fill #0
  Filled 2024-03-28 – 2024-05-31 (×2): qty 90, 90d supply, fill #1

## 2024-02-05 MED ORDER — IBGARD 90 MG PO CPCR: ORAL_CAPSULE | ORAL | 0 refills | Status: AC

## 2024-02-05 NOTE — Progress Notes (Signed)
 Angela Ray [id number] [birth year]   Chief Complaint: Constipation  Referring Provider: [name], MD Primary GI MD: Dr. Shila  HPI: Angela Ray is a 73 y.o. female with past medical history of anxiety/depression, Barrett's esophagus, colon polyps, fibromyalgia, GERD, bladder prolapse, HLD, hysterectomy, cholecystectomy who presents today for a complaint of constipation.    Patient last seen in office 12/08/2023 by Dr. Nandigam for irregular bowel habits and constipation.  Noted significant bowel irregularity since having colonoscopy.  Can have severe constipation lasting up to 10 days despite use of stool softeners and laxatives (Colace, Dulcolax, MiraLAX ).  Can have episodes of uncontrollable diarrhea as well as incontinence.  Endorsed intermittent abdominal pain and low back pain alleviated by having a normal bowel movement.   Noted to have decreased anal sphincter tone on exam. Pelvic floor physical therapy was recommended.  Advised to start Benefiber, Linzess  145 mcg samples given Linzess  145 mcg samples given, advised bowel cleanse similar to colonoscopy prep to clear retained stool. Consideration for CT if symptoms not improved to rule out other causes. InterStim therapy was discussed as an option if incontinence were to persist.  Patient called 12/23/2023 stating Linzess  was working for her and requested prescription.   Patient states that she has been taking Linzess  145 mcg daily, but may go a couple days without a bowel movement.  Has gone up to 10 days without a bowel movement in the past.  Sometimes stools are formed, other times can have loose or watery stools.  States she recently traveled to [place] and was having a bowel movement twice a day with softer stools.  Came home and had uncontrollable watery diarrhea.  She carries an adult diaper with her at all times due to risk of incontinence.  Denies any recent dietary changes.  States she has battled  alternating bowel habits her whole life.  Has tried MiraLAX  in the past but this gives her gas.  Metamucil has caused bloating in the past.  She denies any blood in her stool or melena.  Occasionally will experience RUQ abdominal pain when constipated which is relieved after a bowel movement.  States that it has actually been a while since she has experienced any pain, this has significantly improved as she was previously having daily abdominal pain.  States that she did go to pelvic floor physical therapy and found it helpful.  She has been following their recommendations, doing exercises and toileting techniques to help with constipation.  Though she does still occasionally have constipation on Linzess , she is happy with the current dose and does not want to try a different medication or higher dose of Linzess .  She did not try Benefiber due to concern that it would cause diarrhea.  Previous GI Procedures/Imaging   EGD 08/10/23 - Z- line regular, 40 cm from the incisors.  - Normal esophagus.  - No gross lesions in the entire stomach.  - Normal examined duodenum.   Colonoscopy 07/31/23 - One 5 mm polyp in the cecum, removed with a cold snare. Resected and retrieved.  - Non- bleeding external and internal hemorrhoids.  1. Surgical [P], colon, cecum, polyp (1) :       - SESSILE SERRATED POLYP.       - NO DYSPLASIA OR MALIGNANCY.   Past Medical History:  Diagnosis Date   Allergy 2002   Anxiety    Barrett's esophagus    in the past   Colon polyp    Depression  Dry eye syndrome of both eyes    Dupuytren's contracture of both hands 10/2016   sees Dr.Gramig--[facility]   Essential tremor 2016   legs, arm,lower jaw   Female bladder prolapse    Fibroid    Fibromyalgia    GERD (gastroesophageal reflux disease)    Heat exhaustion    Herniated disc, cervical    C3   Hiatal hernia    History of cardiovascular stress test    Lexiscan  Myoview 7/16:  EF 65%, inferolateral defect  (prob artifactual), no ischemia   History of recurrent UTIs    Hyperlipidemia    IBS (irritable bowel syndrome)    constipation   Leg cramps    Low back pain    Lumbar herniated disc    L4,L5   Osteoarthritis    Osteopenia    Osteoporosis    Osteopenia per pt   Rheumatoid arthritis, adult (HCC) 2016   both feet   Skin cancer    Spinal stenosis    lumbar   Tremors of nervous system    Vertigo     Past Surgical History:  Procedure Laterality Date   ABDOMINAL HYSTERECTOMY  2003   TAH still has ovaries--Dr. Nikki   ANAL RECTAL MANOMETRY [name] 12/23/2019   Procedure: ANO RECTAL MANOMETRY;  Surgeon: Nandigam, Kavitha V, MD;  Location: [facility];  Service: Endoscopy;  Laterality: [name];   CERVICAL LAMINECTOMY  2002   C5/C6   CHOLECYSTECTOMY  2008   fractured ankle Left 10/2014   ROTATOR CUFF REPAIR Right 2011    Current Outpatient Medications  Medication Sig Dispense Refill   Azelaic Acid 15 % cream Apply 1 application topically every morning.     busPIRone  (BUSPAR ) 30 MG tablet Take 1 tablet (30 mg total) by mouth 2 (two) times daily. 180 tablet 3   cetirizine (ZYRTEC) 10 MG tablet Take 10 mg by mouth daily as needed for allergies.      clonazePAM  (KLONOPIN ) 1 MG tablet Take 1 tablet (1 mg total) by mouth 3 (three) times daily. 270 tablet 0   clonazePAM  (KLONOPIN ) 1 MG tablet Take 1 tablet (1 mg total) by mouth 3 (three) times daily as needed. 270 tablet 0   cyclobenzaprine  (FLEXERIL ) 10 MG tablet Take 1 tablet (10 mg) by mouth twice a day as needed for muscle spasms. 30 tablet 0   estradiol  (ESTRACE ) 0.1 MG/GM vaginal cream Apply a pea sized amount daily. 42.5 g 11   fluticasone  (FLONASE ) 50 MCG/ACT nasal spray Place 2 sprays into both nostrils at bedtime as needed for allergies. 48 g 4   ibuprofen  (ADVIL ) 600 MG tablet Take 1 tablet (600 mg total) by mouth every 8 (eight) hours as needed (pain). 40 tablet 1   ipratropium (ATROVENT ) 0.06 % nasal spray Place 2 sprays into both  nostrils 4 (four) times daily. 45 mL 1   lidocaine  (XYLOCAINE ) 5 % ointment Apply daily as needed     Lifitegrast  (XIIDRA ) 5 % SOLN Place 1 drop into both eyes 2 (two) times daily. 180 each 0   linaclotide  (LINZESS ) 145 MCG CAPS capsule Take 1 capsule (145 mcg total) by mouth daily before breakfast. 30 capsule 3   metroNIDAZOLE  (METROCREAM ) 0.75 % cream Apply topically daily as needed. 45 g 1   OVER THE COUNTER MEDICATION Take 1 capsule by mouth 3 (three) times daily. Hydro eye     PARoxetine  (PAXIL ) 20 MG tablet Take 1 tablet (20 mg total) by mouth 2 (two) times daily. 180  tablet 0   propranolol  (INDERAL ) 20 MG tablet Take 1 tablet (20 mg total) by mouth 2 (two) times daily. 180 tablet 0   traMADol  (ULTRAM ) 50 MG tablet Take 1 tablet (50 mg total) by mouth every 8 (eight) hours as needed. 20 tablet 0   traZODone  (DESYREL ) 50 MG tablet Take 1-3 tablets (50-150 mg total) by mouth at bedtime as needed. 270 tablet 3   tretinoin (RETIN-A) 0.025 % cream Apply 1 application topically at bedtime.     trimethoprim  (TRIMPEX ) 100 MG tablet Take 1 tablet (100 mg total) by mouth daily. 90 tablet 3   Current Facility-Administered Medications  Medication Dose Route Frequency Provider Last Rate Last Admin   bisacodyl  (DULCOLAX) EC tablet 5 mg  5 mg Oral Daily PRN Nandigam, Kavitha V, MD        Allergies as of 02/05/2024 - Review Complete 02/05/2024  Allergen Reaction Noted   Aciphex [rabeprazole sodium] Other (See Comments) 06/24/2011   Bupropion hcl Other (See Comments) 05/19/2006   Cephalexin Nausea Only and Other (See Comments) 05/19/2006   Codeine Other (See Comments) 06/24/2011   Diclofenac  Other (See Comments) 07/31/2023   Esomeprazole magnesium Other (See Comments) 06/24/2011   Methadone Palpitations 01/30/2015   Sertraline hcl Other (See Comments) 05/19/2006   Prednisone Other (See Comments) 05/20/2021   Doxycycline hyclate Nausea Only 05/19/2006   Linaclotide  Diarrhea 11/07/2019    Family  History  Problem Relation Age of Onset   Atrial fibrillation Mother    Multiple myeloma Mother    Skin cancer Mother    Coronary artery disease Mother    Irritable bowel syndrome Mother    Coronary artery disease Father 31       CABG 2002, followd by Dr, Micky   Prostate cancer Father    Bladder Cancer Father    Skin cancer Father    Diabetes Father 37       DM type 2   Neuropathy Father    Diabetes Maternal Grandmother    Hypertension Maternal Grandmother    Diabetes Maternal Grandfather    Hypertension Maternal Grandfather    Osteoarthritis Paternal Grandmother    Cancer Maternal Aunt        bone cancer   Colon cancer Neg Hx    Esophageal cancer Neg Hx    Stomach cancer Neg Hx    Rectal cancer Neg Hx    Colon polyps Neg Hx     Social History   Tobacco Use   Smoking status: Never   Smokeless tobacco: Never  Vaping Use   Vaping status: Never Used  Substance Use Topics   Alcohol use: No    Alcohol/week: 0.0 standard drinks of alcohol    Comment: occ glass of wine   Drug use: No     Review of Systems:    Constitutional: No unintentional weight loss, fever, chills Cardiovascular: No chest pain Respiratory: No SOB Gastrointestinal: See HPI and otherwise negative Hematologic: No bleeding or bruising   Physical Exam:  Vital signs: BP 90/60   Pulse 93   Ht 5' 11 (1.803 m)   Wt 154 lb (69.9 kg)   LMP 02/09/2002 (Approximate)   BMI 21.48 kg/m   Wt Readings from Last 3 Encounters:  02/05/24 154 lb (69.9 kg)  12/18/23 155 lb (70.3 kg)  12/08/23 157 lb 8 oz (71.4 kg)    Constitutional: Pleasant, well-appearing female in NAD, alert and cooperative Head:  Normocephalic and atraumatic.  Eyes: No scleral icterus.  Respiratory: Respirations  even and unlabored. Lungs clear to auscultation bilaterally.  No wheezes, crackles, or rhonchi.  Cardiovascular:  Regular rate and rhythm. No murmurs. No peripheral edema. Gastrointestinal:  Soft, nondistended, nontender. No  rebound or guarding. Normal bowel sounds. No appreciable masses or hepatomegaly. Rectal:  Not performed.  Neurologic:  Alert and oriented x4;  grossly normal neurologically.  Skin:   Dry and intact without significant lesions or rashes. Psychiatric: Oriented to person, place and time. Demonstrates good judgement and reason without abnormal affect or behaviors.   RELEVANT LABS AND IMAGING: CBC    Component Value Date/Time   WBC 4.1 08/03/2023 1112   RBC 4.17 08/03/2023 1112   HGB 13.5 08/03/2023 1112   HGB 13.6 03/06/2017 1106   HGB 13.6 02/02/2015 0854   HCT 39.3 08/03/2023 1112   HCT 41.5 03/06/2017 1106   PLT 224.0 08/03/2023 1112   PLT 240 03/06/2017 1106   MCV 94.4 08/03/2023 1112   MCV 96 03/06/2017 1106   MCH 32.2 05/05/2022 0642   MCHC 34.4 08/03/2023 1112   RDW 13.1 08/03/2023 1112   RDW 13.2 03/06/2017 1106   LYMPHSABS 1.5 02/03/2023 1608   MONOABS 0.6 02/03/2023 1608   EOSABS 0.1 02/03/2023 1608   BASOSABS 0.1 02/03/2023 1608    CMP     Component Value Date/Time   NA 144 08/03/2023 1112   NA 143 03/06/2017 1106   K 4.1 08/03/2023 1112   CL 105 08/03/2023 1112   CO2 31 08/03/2023 1112   GLUCOSE 96 08/03/2023 1112   BUN 5 (L) 08/03/2023 1112   BUN 14 03/06/2017 1106   CREATININE 0.78 08/03/2023 1112   CREATININE 0.75 02/22/2016 1347   CALCIUM 9.3 08/03/2023 1112   PROT 6.9 08/03/2023 1112   PROT 6.9 03/06/2017 1106   ALBUMIN 4.6 08/03/2023 1112   ALBUMIN 4.6 03/06/2017 1106   AST 27 08/03/2023 1112   ALT 18 08/03/2023 1112   ALKPHOS 32 (L) 08/03/2023 1112   BILITOT 0.8 08/03/2023 1112   BILITOT 0.6 03/06/2017 1106   GFRNONAA >60 05/05/2022 0642   GFRAA >60 10/23/2018 2109     Assessment/Plan:   Irritable bowel syndrome with both constipation and diarrhea Fecal incontinence RUQ abdominal pain Patient seen today for follow-up of IBS.  Has had history of severe constipation despite use of stool softeners and laxatives, alternating with  uncontrollable diarrhea and incontinence.  At last visit in July was having some intermittent abdominal pain alleviated by a bowel movement.  Noted to have decreased anal sphincter tone on exam and was referred to pelvic floor PT, which she did go to and has noted some benefit from this.  She has been taking Linzess  145 mcg daily which has helped with the constipation.  Typically having a bowel movement every couple days, but does still having some intermittent loose stools and diarrhea.  She did not try Benefiber. Since last visit states that her abdominal pain has significantly improved and she is really not having any issues with pain at this time. Though she still has irregular bowel habits, she is happy with current dose of Linzess  and does not want to change that at this time.  She is willing to give Benefiber a try.  We discussed the possibility of treating for SIBO if she does not see improvement on Benefiber.  She is interested in this and will let us  know after a few weeks if she continues to have intermittent loose stools.  - Continue Linzess  145 mcg, will send  90-day supply per patient request - Start Benefiber 1 tablespoon daily - Will give samples of Ibgard - If no improvement in irregular bowel habits with Benefiber, consider empiric treatment for SIBO/IBS-D with Xifaxan/metronidazole  - Consider abdominal imaging if recurrence of abdominal pain.  Will hold off at this time as it seems pain has improved.   Camie Furbish, PA-C [facility] 02/05/2024, 3:26 PM  Patient Care Team: Copland, [name], MD as PCP - General (Family Medicine) Camella Fallow, MD as Consulting Physician (Orthopedic Surgery)

## 2024-02-05 NOTE — Patient Instructions (Addendum)
 Continue Linzess  145 mcg.  Start a fiber supplement Benefiber.  Take 1 tablespoon once daily can be used to keep bowels regular if needed.  We have given you samples of the following medication to take: IBgard, take 2 tablets as needed.  Follow up in 6-8 weeks.  Thank you for trusting me with your gastrointestinal care!   Camie Furbish, PA-C   _______________________________________________________  If your blood pressure at your visit was 140/90 or greater, please contact your primary care physician to follow up on this.  _______________________________________________________  If you are age 10 or older, your body mass index should be between 23-30. Your Body mass index is 21.48 kg/m. If this is out of the aforementioned range listed, please consider follow up with your Primary Care Provider.  If you are age 11 or younger, your body mass index should be between 19-25. Your Body mass index is 21.48 kg/m. If this is out of the aformentioned range listed, please consider follow up with your Primary Care Provider.   ________________________________________________________  The [facility] providers would like to encourage you to use MYCHART to communicate with providers for non-urgent requests or questions.  Due to long hold times on the telephone, sending your provider a message by [facility] may be a faster and more efficient way to get a response.  Please allow 48 business hours for a response.  Please remember that this is for non-urgent requests.  _______________________________________________________  [facility] is using a team-based approach to care.  Your team is made up of your doctor and two to three APPS. Our APPS (Nurse Practitioners and Physician Assistants) work with your physician to ensure care continuity for you. They are fully qualified to address your health concerns and develop a treatment plan. They communicate directly with your gastroenterologist to care  for you. Seeing the Advanced Practice Practitioners on your physician's team can help you by facilitating care more promptly, often allowing for earlier appointments, access to diagnostic testing, procedures, and other specialty referrals.

## 2024-02-08 ENCOUNTER — Encounter: Payer: Self-pay | Admitting: Gastroenterology

## 2024-02-09 ENCOUNTER — Other Ambulatory Visit (HOSPITAL_BASED_OUTPATIENT_CLINIC_OR_DEPARTMENT_OTHER): Payer: Self-pay

## 2024-02-09 ENCOUNTER — Ambulatory Visit

## 2024-02-09 MED ORDER — BUSPIRONE HCL 30 MG PO TABS
30.0000 mg | ORAL_TABLET | Freq: Two times a day (BID) | ORAL | 3 refills | Status: AC
  Filled 2024-02-09 – 2024-04-19 (×2): qty 180, 90d supply, fill #0

## 2024-02-11 ENCOUNTER — Ambulatory Visit

## 2024-02-11 DIAGNOSIS — Z23 Encounter for immunization: Secondary | ICD-10-CM | POA: Diagnosis not present

## 2024-02-12 ENCOUNTER — Telehealth: Payer: Self-pay | Admitting: *Deleted

## 2024-02-12 ENCOUNTER — Ambulatory Visit (INDEPENDENT_AMBULATORY_CARE_PROVIDER_SITE_OTHER): Admitting: *Deleted

## 2024-02-12 VITALS — BP 106/72 | HR 61 | Temp 97.9°F | Resp 16 | Ht 71.0 in | Wt 150.8 lb

## 2024-02-12 DIAGNOSIS — Z0000 Encounter for general adult medical examination without abnormal findings: Secondary | ICD-10-CM | POA: Diagnosis not present

## 2024-02-12 DIAGNOSIS — Z23 Encounter for immunization: Secondary | ICD-10-CM

## 2024-02-12 NOTE — Patient Instructions (Addendum)
 Ms. Hook , Thank you for taking time out of your busy schedule to complete your Annual Wellness Visit with me. I enjoyed our conversation and look forward to speaking with you again next year. I, as well as your care team,  appreciate your ongoing commitment to your health goals. Please review the following plan we discussed and let me know if I can assist you in the future. Your Game plan/ To Do List    Follow up Visits: Next Medicare AWV with our clinical staff:  02/22/25 1pm, in person  Next Office Visit with your provider: 08/02/24 11am, Dr Watt. Physical  Clinician Recommendations:  Aim for 30 minutes of exercise or brisk walking, 6-8 glasses of water, and 5 servings of fruits and vegetables each day.       This is a list of the screening recommended for you and due dates:  Health Maintenance  Topic Date Due   Pneumococcal Vaccine for age over 25 (2 of 2 - PCV20 or PCV21) 04/20/2019   Medicare Annual Wellness Visit  08/22/2023   Flu Shot  12/11/2023   COVID-19 Vaccine (4 - 2025-26 season) 01/11/2024   Colon Cancer Screening  07/30/2028   DTaP/Tdap/Td vaccine (3 - Td or Tdap) 11/23/2032   DEXA scan (bone density measurement)  Completed   Hepatitis C Screening  Completed   Zoster (Shingles) Vaccine  Completed   HPV Vaccine  Aged Out   Meningitis B Vaccine  Aged Out   Breast Cancer Screening  Discontinued    Advanced directives: (Copy Requested) Please bring a copy of your health care power of attorney and living will to the office to be added to your chart at your convenience. You can mail to [facility] [address] [place], [place] [address] or email to [email or web address] Advance Care Planning is important because it:  [x]  Makes sure you receive the medical care that is consistent with your values, goals, and preferences  [x]  It provides guidance to your family and loved ones and reduces their decisional burden about whether or not they are  making the right decisions based on your wishes.  Follow the link provided in your after visit summary or read over the paperwork we have mailed to you to help you started getting your Advance Directives in place. If you need assistance in completing these, please reach out to us  so that we can help you!  See attachments for Preventive Care and Fall Prevention Tips.

## 2024-02-12 NOTE — Telephone Encounter (Signed)
 FYI:  She reports multiple (around 7) falls in the last year.  States each one has been where she was not looking at where she was walking, holding object in arms and then tripped over step, mulch, buggy (one foot was caught under buggy when she went to turn away) etc... States she does not feel dizzy or off balance in any of these episodes.  Just wanted you to be aware.  Please advise if any recommendations?

## 2024-02-12 NOTE — Progress Notes (Signed)
 Please attest this visit in the absence of patient primary care provider.    Subjective:   Angela Ray is a 73 y.o. who presents for a Medicare Wellness preventive visit.  As a reminder, Annual Wellness Visits don't include a physical exam, and some assessments may be limited, especially if this visit is performed virtually. We may recommend an in-person follow-up visit with your provider if needed.  Visit Complete: In person  Persons Participating in Visit: Patient.  AWV Questionnaire: Yes: Patient Medicare AWV questionnaire was completed by the patient on 02/10/24; I have confirmed that all information answered by patient is correct and no changes since this date.  Cardiac Risk Factors include: advanced age (>47men, >15 women);Other (see comment), Risk factor comments: hx of skin cancer, diastolic dysfunction     Objective:    Today's Vitals   02/12/24 1257  Weight: 150 lb 12.8 oz (68.4 kg)  Height: 5' 11 (1.803 m)   Body mass index is 21.03 kg/m.     02/12/2024    1:31 PM 08/22/2022    3:44 PM 06/06/2022    2:56 PM 08/20/2021    9:37 AM 12/16/2019    9:38 AM 10/23/2018    9:01 PM 10/09/2017    6:47 AM  Advanced Directives  Does Patient Have a Medical Advance Directive? Yes Yes Yes Yes Yes Yes No   Type of Estate agent of [place];Living will Healthcare Power of [place];Living will;Out of facility DNR (pink MOST or yellow form)  Healthcare Power of [place];Living will Healthcare Power of [place];Living will Healthcare Power of [place];Living will   Does patient want to make changes to medical advance directive? No - Patient declined No - Patient declined   No - Patient declined No - Patient declined    Copy of Healthcare Power of Attorney in Chart? No - copy requested No - copy requested  Yes - validated most recent copy scanned in chart (See row information) No - copy requested No - copy requested    Would patient like information on creating a  medical advance directive?      No - Patient declined       Data saved with a previous flowsheet row definition    Current Medications (verified) Outpatient Encounter Medications as of 02/12/2024  Medication Sig   Azelaic Acid 15 % cream Apply 1 application topically every morning.   busPIRone  (BUSPAR ) 30 MG tablet Take 1 tablet (30 mg total) by mouth 2 (two) times daily.   cetirizine (ZYRTEC) 10 MG tablet Take 10 mg by mouth daily as needed for allergies.    clonazePAM  (KLONOPIN ) 1 MG tablet Take 1 tablet (1 mg total) by mouth 3 (three) times daily as needed.   cyclobenzaprine  (FLEXERIL ) 10 MG tablet Take 1 tablet (10 mg) by mouth twice a day as needed for muscle spasms.   estradiol  (ESTRACE ) 0.1 MG/GM vaginal cream Apply a pea sized amount daily.   fluticasone  (FLONASE ) 50 MCG/ACT nasal spray Place 2 sprays into both nostrils at bedtime as needed for allergies.   ibuprofen  (ADVIL ) 600 MG tablet Take 1 tablet (600 mg total) by mouth every 8 (eight) hours as needed (pain).   ipratropium (ATROVENT ) 0.06 % nasal spray Place 2 sprays into both nostrils 4 (four) times daily.   lidocaine  (XYLOCAINE ) 5 % ointment Apply daily as needed   Lifitegrast  (XIIDRA ) 5 % SOLN Place 1 drop into both eyes 2 (two) times daily.   linaclotide  (LINZESS ) 145 MCG CAPS capsule  Take 1 capsule daily.   metroNIDAZOLE  (METROCREAM ) 0.75 % cream Apply topically daily as needed.   OVER THE COUNTER MEDICATION Take 1 capsule by mouth 3 (three) times daily. Hydro eye   PARoxetine  (PAXIL ) 20 MG tablet Take 1 tablet (20 mg total) by mouth 2 (two) times daily.   Peppermint Oil (IBGARD) 90 MG CPCR Take 2 capsules as directed.   propranolol  (INDERAL ) 20 MG tablet Take 1 tablet (20 mg total) by mouth 2 (two) times daily.   traMADol  (ULTRAM ) 50 MG tablet Take 1 tablet (50 mg total) by mouth every 8 (eight) hours as needed.   traZODone  (DESYREL ) 50 MG tablet Take 1-3 tablets (50-150 mg total) by mouth at bedtime as needed.    tretinoin (RETIN-A) 0.025 % cream Apply 1 application topically at bedtime.   trimethoprim  (TRIMPEX ) 100 MG tablet Take 1 tablet (100 mg total) by mouth daily.   [DISCONTINUED] clonazePAM  (KLONOPIN ) 1 MG tablet Take 1 tablet (1 mg total) by mouth 3 (three) times daily.   Facility-Administered Encounter Medications as of 02/12/2024  Medication   bisacodyl  (DULCOLAX) EC tablet 5 mg    Allergies (verified) Aciphex [rabeprazole sodium], Bupropion hcl, Cephalexin, Codeine, Diclofenac , Esomeprazole magnesium, Methadone, Sertraline hcl, Prednisone, Doxycycline hyclate, and Linaclotide    History: Past Medical History:  Diagnosis Date   Allergy 2002   Anxiety    Barrett's esophagus    in the past   Colon polyp    Depression    Dry eye syndrome of both eyes    Dupuytren's contracture of both hands 10/2016   sees Dr.Gramig--[facility]   Essential tremor 2016   legs, arm,lower jaw   Female bladder prolapse    Fibroid    Fibromyalgia    GERD (gastroesophageal reflux disease)    Heat exhaustion    Herniated disc, cervical    C3   Hiatal hernia    History of cardiovascular stress test    Lexiscan  Myoview 7/16:  EF 65%, inferolateral defect (prob artifactual), no ischemia   History of recurrent UTIs    Hyperlipidemia    IBS (irritable bowel syndrome)    constipation   Leg cramps    Low back pain    Lumbar herniated disc    L4,L5   Osteoarthritis    Osteopenia    Osteoporosis    Osteopenia per pt   Rheumatoid arthritis, adult (HCC) 2016   both feet   Skin cancer    Spinal stenosis    lumbar   Tremors of nervous system    Vertigo    Past Surgical History:  Procedure Laterality Date   ABDOMINAL HYSTERECTOMY  2003   TAH still has ovaries--Dr. Nikki   ANAL RECTAL MANOMETRY [name] 12/23/2019   Procedure: ANO RECTAL MANOMETRY;  Surgeon: Nandigam, Kavitha V, MD;  Location: [facility];  Service: Endoscopy;  Laterality: [name];   CERVICAL LAMINECTOMY  2002   C5/C6    CHOLECYSTECTOMY  2008   fractured ankle Left 10/2014   ROTATOR CUFF REPAIR Right 2011   Family History  Problem Relation Age of Onset   Atrial fibrillation Mother    Multiple myeloma Mother    Skin cancer Mother    Coronary artery disease Mother    Irritable bowel syndrome Mother    Coronary artery disease Father 64       CABG 2002, followd by Dr, Micky   Prostate cancer Father    Bladder Cancer Father    Skin cancer Father    Diabetes Father  82       DM type 2   Neuropathy Father    Diabetes Maternal Grandmother    Hypertension Maternal Grandmother    Diabetes Maternal Grandfather    Hypertension Maternal Grandfather    Osteoarthritis Paternal Grandmother    Cancer Maternal Aunt        bone cancer   Colon cancer Neg Hx    Esophageal cancer Neg Hx    Stomach cancer Neg Hx    Rectal cancer Neg Hx    Colon polyps Neg Hx    Social History   Socioeconomic History   Marital status: Married    Spouse name: Not on file   Number of children: 0   Years of education: 12   Highest education level: 12th grade  Occupational History   Occupation: Retired    Associate Professor: RETIRED  Tobacco Use   Smoking status: Never   Smokeless tobacco: Never  Vaping Use   Vaping status: Never Used  Substance and Sexual Activity   Alcohol use: No    Alcohol/week: 0.0 standard drinks of alcohol    Comment: occ glass of wine   Drug use: No   Sexual activity: Not Currently    Partners: Male    Birth control/protection: Surgical    Comment: TAH still has ovaries  Other Topics Concern   Not on file  Social History Narrative   Lives at home with her husband.   Right-handed.   Rarely uses caffeine.   Social Drivers of Corporate investment banker Strain: Low Risk  (02/12/2024)   Overall Financial Resource Strain (CARDIA)    Difficulty of Paying Living Expenses: Not very hard  Food Insecurity: No Food Insecurity (02/12/2024)   Hunger Vital Sign    Worried About Running Out of Food in the Last  Year: Never true    Ran Out of Food in the Last Year: Never true  Transportation Needs: No Transportation Needs (02/12/2024)   PRAPARE - Administrator, Civil Service (Medical): No    Lack of Transportation (Non-Medical): No  Physical Activity: Sufficiently Active (02/12/2024)   Exercise Vital Sign    Days of Exercise per Week: 3 days    Minutes of Exercise per Session: 60 min  Stress: No Stress Concern Present (02/12/2024)   [employer] of Occupational Health - Occupational Stress Questionnaire    Feeling of Stress: Not at all  Social Connections: Moderately Integrated (02/12/2024)   Social Connection and Isolation Panel    Frequency of Communication with Friends and Family: More than three times a week    Frequency of Social Gatherings with Friends and Family: Once a week    Attends Religious Services: Never    Database administrator or Organizations: Yes    Attends Engineer, structural: More than 4 times per year    Marital Status: Married    Tobacco Counseling Counseling given: Not Answered    Clinical Intake:  Pre-visit preparation completed: Yes  Pain : No/denies pain     BMI - recorded: 21.03 Nutritional Status: BMI of 19-24  Normal Nutritional Risks: None Diabetes: No  Lab Results  Component Value Date   HGBA1C 5.3 08/03/2023   HGBA1C 5.3 05/20/2021   HGBA1C 5.2 11/07/2019     How often do you need to have someone help you when you read instructions, pamphlets, or other written materials from your doctor or pharmacy?: 1 - Never What is the last grade level you completed in  school?: 12  Interpreter Needed?: No  Information entered by :: Lolita Libra, CMA(AAMA)   Activities of Daily Living     02/10/2024    7:19 PM 02/04/2024    6:38 PM  In your present state of health, do you have any difficulty performing the following activities:  Hearing? 0 0  Vision? 0 0  Difficulty concentrating or making decisions? 0 0  Walking or  climbing stairs? 0 0  Dressing or bathing? 0 0  Doing errands, shopping? 0 0  Preparing Food and eating ? N N  Using the Toilet? N N  In the past six months, have you accidently leaked urine? N N  Do you have problems with loss of bowel control? N N  Managing your Medications? N N  Managing your Finances? N N  Housekeeping or managing your Housekeeping? N N    Patient Care Team: Copland, [name], MD as PCP - General (Family Medicine) Camella Fallow, MD as Consulting Physician (Orthopedic Surgery) Marcey Elspeth PARAS, MD as Consulting Physician (Ophthalmology)  I have updated your Care Teams any recent Medical Services you may have received from other providers in the past year.     Assessment:   This is a routine wellness examination for Andretta.  Hearing/Vision screen Hearing Screening - Comments:: Has tinnitus. Vision Screening - Comments:: Up to date with routine eye exams with Dr Marcey /[facility]   Goals Addressed               This Visit's Progress     Patient Stated (pt-stated)        Wants to get back to water aerobics 3 days a week for an hour each day       Depression Screen     02/12/2024    1:18 PM 09/17/2023   10:22 AM 08/22/2022    4:01 PM 01/28/2022    8:43 AM 01/15/2022    3:56 PM 08/20/2021    9:45 AM 05/20/2021    9:23 AM  PHQ 2/9 Scores  PHQ - 2 Score 0 0 1 0 0 1 0  PHQ- 9 Score 5 0         Fall Risk     02/10/2024    7:19 PM 02/04/2024    6:38 PM 09/17/2023   10:22 AM 09/23/2022   11:26 AM 08/15/2022    4:00 PM  Fall Risk   Falls in the past year? 1 1 1 1  0  Comment Had multiple incidents where one foot got caught under different objects.      Number falls in past yr: 1 1 0 0 0  Injury with Fall? 1 1 0 1 0  Risk for fall due to :   History of fall(s) History of fall(s) No Fall Risks  Follow up Education provided   Falls evaluation completed Falls evaluation completed    MEDICARE RISK AT HOME:  Medicare Risk at Home Any stairs in or around the  home?: (Patient-Rptd) No Home free of loose throw rugs in walkways, pet beds, electrical cords, etc?: (Patient-Rptd) Yes Adequate lighting in your home to reduce risk of falls?: (Patient-Rptd) Yes Life alert?: (Patient-Rptd) Yes Use of a cane, walker or w/c?: (Patient-Rptd) No Grab bars in the bathroom?: (Patient-Rptd) Yes Shower chair or bench in shower?: (Patient-Rptd) Yes Elevated toilet seat or a handicapped toilet?: (Patient-Rptd) Yes  TIMED UP AND GO:  Was the test performed?  Yes  Length of time to ambulate 10 feet: 6 sec  Gait steady and fast without use of assistive device  Cognitive Function: 6CIT completed    08/22/2022    4:18 PM  MMSE - Mini Mental State Exam  Not completed: Unable to complete        02/12/2024    1:31 PM  6CIT Screen  What Year? 0 points  What month? 0 points  What time? 0 points  Count back from 20 0 points  Months in reverse 0 points  Repeat phrase 0 points  Total Score 0 points    Immunizations Immunization History  Administered Date(s) Administered   INFLUENZA, HIGH DOSE SEASONAL PF 03/01/2018, 02/15/2019, 02/07/2020, 02/26/2021, 02/06/2022, 02/11/2024   Influenza,inj,Quad PF,6+ Mos 02/10/2017   Influenza-Unspecified 03/12/2016, 02/12/2023   Moderna Sars-Covid-2 Vaccination 05/10/2019, 06/07/2019, 06/18/2019   PNEUMOCOCCAL CONJUGATE-20 02/12/2024   Pneumococcal Conjugate-13 04/19/2018   Pneumococcal-Unspecified 05/12/2014   Tdap 05/12/2014, 11/24/2022   Zoster Recombinant(Shingrix) 11/08/2019, 01/16/2020   Zoster, Live 05/12/2014    Screening Tests Health Maintenance  Topic Date Due   Medicare Annual Wellness (AWV)  08/22/2023   COVID-19 Vaccine (4 - 2025-26 season) 02/11/2025 (Originally 01/11/2024)   Colonoscopy  07/30/2028   DTaP/Tdap/Td (3 - Td or Tdap) 11/23/2032   Pneumococcal Vaccine: 50+ Years  Completed   Influenza Vaccine  Completed   DEXA SCAN  Completed   Hepatitis C Screening  Completed   Zoster Vaccines-  Shingrix  Completed   HPV VACCINES  Aged Out   Meningococcal B Vaccine  Aged Out   Mammogram  Discontinued    Health Maintenance Items Addressed: PCV 20 given today. Had flu shot yesterday at [facility] and record has been updated.  Additional Screening:  Vision Screening: Recommended annual ophthalmology exams for early detection of glaucoma and other disorders of the eye. Is the patient up to date with their annual eye exam?  Yes  Who is the provider or what is the name of the office in which the patient attends annual eye exams? [facility] / [facility]  Dental Screening: Recommended annual dental exams for proper oral hygiene  Community Resource Referral / Chronic Care Management: CRR required this visit?  No   CCM required this visit?  No   Plan:    I have personally reviewed and noted the following in the patient's chart:   Medical and social history Use of alcohol, tobacco or illicit drugs  Current medications and supplements including opioid prescriptions. Patient is not currently taking opioid prescriptions. Functional ability and status Nutritional status Physical activity Advanced directives List of other physicians Hospitalizations, surgeries, and ER visits in previous 12 months Vitals Screenings to include cognitive, depression, and falls Referrals and appointments  In addition, I have reviewed and discussed with patient certain preventive protocols, quality metrics, and best practice recommendations. A written personalized care plan for preventive services as well as general preventive health recommendations were provided to patient.   Lolita Libra, CMA   02/12/2024   After Visit Summary: (In Person-Printed) AVS printed and given to the patient  Notes: see phone note

## 2024-03-05 ENCOUNTER — Other Ambulatory Visit (HOSPITAL_BASED_OUTPATIENT_CLINIC_OR_DEPARTMENT_OTHER): Payer: Self-pay

## 2024-03-07 ENCOUNTER — Other Ambulatory Visit (HOSPITAL_BASED_OUTPATIENT_CLINIC_OR_DEPARTMENT_OTHER): Payer: Self-pay

## 2024-03-07 ENCOUNTER — Other Ambulatory Visit: Payer: Self-pay

## 2024-03-28 ENCOUNTER — Other Ambulatory Visit: Payer: Self-pay | Admitting: Family Medicine

## 2024-03-28 ENCOUNTER — Other Ambulatory Visit (HOSPITAL_BASED_OUTPATIENT_CLINIC_OR_DEPARTMENT_OTHER): Payer: Self-pay

## 2024-03-28 DIAGNOSIS — J3089 Other allergic rhinitis: Secondary | ICD-10-CM

## 2024-03-28 MED ORDER — FLUTICASONE PROPIONATE 50 MCG/ACT NA SUSP
2.0000 | Freq: Every evening | NASAL | 1 refills | Status: AC | PRN
  Filled 2024-03-28: qty 48, 90d supply, fill #0

## 2024-04-01 ENCOUNTER — Other Ambulatory Visit (HOSPITAL_BASED_OUTPATIENT_CLINIC_OR_DEPARTMENT_OTHER): Payer: Self-pay

## 2024-04-05 ENCOUNTER — Ambulatory Visit: Payer: Self-pay

## 2024-04-05 ENCOUNTER — Other Ambulatory Visit (HOSPITAL_BASED_OUTPATIENT_CLINIC_OR_DEPARTMENT_OTHER): Payer: Self-pay

## 2024-04-05 ENCOUNTER — Other Ambulatory Visit: Payer: Self-pay | Admitting: Family Medicine

## 2024-04-05 DIAGNOSIS — G250 Essential tremor: Secondary | ICD-10-CM

## 2024-04-05 MED ORDER — PROPRANOLOL HCL 20 MG PO TABS
20.0000 mg | ORAL_TABLET | Freq: Two times a day (BID) | ORAL | 0 refills | Status: AC
  Filled 2024-04-20: qty 180, 90d supply, fill #0

## 2024-04-05 NOTE — Telephone Encounter (Signed)
 FYI Only or Action Required?: FYI only for provider: appointment scheduled on 04/11/2024.  Patient was last seen in primary care on 12/18/2023 by Frann Mabel Mt, DO.  Called Nurse Triage reporting Tinnitus.  Symptoms began several months ago.  Interventions attempted: Rest, hydration, or home remedies.  Symptoms are: gradually worsening.  Triage Disposition: See PCP When Office is Open (Within 3 Days)  Patient/caregiver understands and will follow disposition?: Yes   Copied from CRM #[id number]. Topic: Clinical - Red Word Triage >> Apr 05, 2024  3:27 PM Shereese L wrote: [facility] that prompted transfer to Nurse Triage: Loud Ringing in right ear Reason for Disposition  MODERATE-SEVERE tinnitus (i.e., interferes with work, school, or sleep)  Answer Assessment - Initial Assessment Questions Pt states she has a ringing in the R ear that has been present for months but is getting louder. Calling requesting to have PCP order an MRI of her brain. Appointment scheduled for evaluation. Patient agrees with plan of care, and will call back if anything changes, or if symptoms worsen.    1. DESCRIPTION: Describe the sound you are hearing. (e.g., buzzing, hissing, humming, ringing)     Ringing  2. LOCATION: Is the sound in one or both ears? If one, ask: Which ear?     Right ear; possibly in both ears but so loud unable to differentiate between   3. SEVERITY: How bad is it?  Severe       4. ONSET: When did this begin? Did it start suddenly or come on gradually?     Months; started suddenly and ringing has gotten louder   5. PATTERN: Does this come and go, or has it been constant since it started?     Constant   6. HEARING LOSS: Is your hearing decreased? (e.g., normal, decreased)       Yes due to the loudness of ringing; fells she has to have people repeat themselves   7. OTHER SYMPTOMS: Do you have any other symptoms? (e.g., dizziness, earache)     Earache L ear;  vertigo  Protocols used: Tinnitus-A-AH

## 2024-04-05 NOTE — Telephone Encounter (Signed)
 Appt scheduled

## 2024-04-06 ENCOUNTER — Other Ambulatory Visit (HOSPITAL_BASED_OUTPATIENT_CLINIC_OR_DEPARTMENT_OTHER): Payer: Self-pay

## 2024-04-06 MED ORDER — PAROXETINE HCL 20 MG PO TABS: 20.0000 mg | ORAL_TABLET | Freq: Two times a day (BID) | ORAL | 2 refills | Status: AC
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
## 2024-04-11 ENCOUNTER — Other Ambulatory Visit: Payer: Self-pay

## 2024-04-11 ENCOUNTER — Ambulatory Visit: Admitting: Family Medicine

## 2024-04-11 ENCOUNTER — Other Ambulatory Visit (HOSPITAL_BASED_OUTPATIENT_CLINIC_OR_DEPARTMENT_OTHER): Payer: Self-pay

## 2024-04-11 VITALS — BP 102/60 | HR 63 | Temp 98.1°F | Ht 71.0 in | Wt 158.6 lb

## 2024-04-11 DIAGNOSIS — R5383 Other fatigue: Secondary | ICD-10-CM

## 2024-04-11 DIAGNOSIS — Z13220 Encounter for screening for lipoid disorders: Secondary | ICD-10-CM | POA: Diagnosis not present

## 2024-04-11 DIAGNOSIS — Z1329 Encounter for screening for other suspected endocrine disorder: Secondary | ICD-10-CM

## 2024-04-11 DIAGNOSIS — H93A9 Pulsatile tinnitus, unspecified ear: Secondary | ICD-10-CM | POA: Diagnosis not present

## 2024-04-11 DIAGNOSIS — Z131 Encounter for screening for diabetes mellitus: Secondary | ICD-10-CM | POA: Diagnosis not present

## 2024-04-11 MED ORDER — TRIMETHOPRIM 100 MG PO TABS
100.0000 mg | ORAL_TABLET | Freq: Every day | ORAL | 3 refills | Status: AC
  Filled 2024-04-11: qty 90, 90d supply, fill #0

## 2024-04-11 NOTE — Patient Instructions (Addendum)
 I will be in touch with your labs asap  We will set you up with an MRI of your brain and your blood vessels in your head due to the pulsatile tinnitus Please talk to Dr Vincente about how you have been feeling

## 2024-04-12 ENCOUNTER — Encounter: Payer: Self-pay | Admitting: Family Medicine

## 2024-04-12 LAB — BASIC METABOLIC PANEL WITH GFR
BUN: 11 mg/dL (ref 6–23)
CO2: 28 meq/L (ref 19–32)
Calcium: 8.9 mg/dL (ref 8.4–10.5)
Chloride: 104 meq/L (ref 96–112)
Creatinine, Ser: 0.7 mg/dL (ref 0.40–1.20)
GFR: 87.06 mL/min (ref >60.00)
Glucose, Bld: 90 mg/dL (ref 70–99)
Potassium: 3.9 meq/L (ref 3.5–5.1)
Sodium: 140 meq/L (ref 135–145)

## 2024-04-12 LAB — LIPID PANEL
Cholesterol: 242 mg/dL — ABNORMAL HIGH (ref 0–200)
HDL: 46.6 mg/dL (ref >39.00)
LDL Cholesterol: 151 mg/dL — ABNORMAL HIGH (ref 0–99)
NonHDL: 195.08
Total CHOL/HDL Ratio: 5
Triglycerides: 221 mg/dL — ABNORMAL HIGH (ref 0.0–149.0)
VLDL: 44.2 mg/dL — ABNORMAL HIGH (ref 0.0–40.0)

## 2024-04-12 LAB — CBC
HCT: 37.5 % (ref 36.0–46.0)
Hemoglobin: 13 g/dL (ref 12.0–15.0)
MCHC: 34.8 g/dL (ref 30.0–36.0)
MCV: 94.8 fl (ref 78.0–100.0)
Platelets: 217 K/uL (ref 150.0–400.0)
RBC: 3.96 Mil/uL (ref 3.87–5.11)
RDW: 13.5 % (ref 11.5–15.5)
WBC: 4.5 K/uL (ref 4.0–10.5)

## 2024-04-12 LAB — TSH: TSH: 1.64 u[IU]/mL (ref 0.35–5.50)

## 2024-04-12 LAB — HEMOGLOBIN A1C: Hgb A1c MFr Bld: 5.2 % (ref 4.6–6.5)

## 2024-04-19 ENCOUNTER — Other Ambulatory Visit (HOSPITAL_BASED_OUTPATIENT_CLINIC_OR_DEPARTMENT_OTHER): Payer: Self-pay

## 2024-04-19 ENCOUNTER — Other Ambulatory Visit: Payer: Self-pay

## 2024-04-19 MED ORDER — CLONAZEPAM 1 MG PO TABS
1.0000 mg | ORAL_TABLET | Freq: Three times a day (TID) | ORAL | 0 refills | Status: AC | PRN
  Filled 2024-04-25: qty 60, 20d supply, fill #0
  Filled 2024-04-25: qty 210, 70d supply, fill #1
  Filled 2024-04-25: qty 210, 70d supply, fill #0
  Filled 2024-04-25: qty 270, 90d supply, fill #0
  Filled 2024-04-25: qty 60, 20d supply, fill #0
  Filled 2024-04-25: qty 210, 70d supply, fill #0

## 2024-04-19 MED ORDER — PAROXETINE HCL 20 MG PO TABS
20.0000 mg | ORAL_TABLET | Freq: Two times a day (BID) | ORAL | 2 refills | Status: AC
  Filled 2024-04-19 (×2): qty 90, 45d supply, fill #0

## 2024-04-20 ENCOUNTER — Ambulatory Visit (HOSPITAL_COMMUNITY): Admission: RE | Admit: 2024-04-20 | Discharge: 2024-04-20 | Attending: Family Medicine | Admitting: Family Medicine

## 2024-04-20 ENCOUNTER — Other Ambulatory Visit: Payer: Self-pay

## 2024-04-20 ENCOUNTER — Other Ambulatory Visit (HOSPITAL_BASED_OUTPATIENT_CLINIC_OR_DEPARTMENT_OTHER): Payer: Self-pay

## 2024-04-20 DIAGNOSIS — R42 Dizziness and giddiness: Secondary | ICD-10-CM

## 2024-04-20 DIAGNOSIS — H93A9 Pulsatile tinnitus, unspecified ear: Secondary | ICD-10-CM

## 2024-04-20 MED ORDER — GADOBUTROL 1 MMOL/ML IV SOLN
7.0000 mL | Freq: Once | INTRAVENOUS | Status: AC | PRN
  Administered 2024-04-20: 7 mL via INTRAVENOUS

## 2024-04-21 ENCOUNTER — Encounter: Payer: Self-pay | Admitting: Family Medicine

## 2024-04-21 ENCOUNTER — Other Ambulatory Visit (HOSPITAL_BASED_OUTPATIENT_CLINIC_OR_DEPARTMENT_OTHER): Payer: Self-pay

## 2024-04-25 ENCOUNTER — Other Ambulatory Visit: Payer: Self-pay

## 2024-04-25 ENCOUNTER — Other Ambulatory Visit (HOSPITAL_BASED_OUTPATIENT_CLINIC_OR_DEPARTMENT_OTHER): Payer: Self-pay

## 2024-04-29 ENCOUNTER — Other Ambulatory Visit (HOSPITAL_BASED_OUTPATIENT_CLINIC_OR_DEPARTMENT_OTHER): Payer: Self-pay

## 2024-05-31 ENCOUNTER — Other Ambulatory Visit (HOSPITAL_BASED_OUTPATIENT_CLINIC_OR_DEPARTMENT_OTHER): Payer: Self-pay

## 2024-06-01 ENCOUNTER — Other Ambulatory Visit (HOSPITAL_BASED_OUTPATIENT_CLINIC_OR_DEPARTMENT_OTHER): Payer: Self-pay

## 2024-07-29 ENCOUNTER — Ambulatory Visit: Admitting: Gastroenterology

## 2024-08-03 ENCOUNTER — Encounter: Admitting: Family Medicine

## 2024-09-13 ENCOUNTER — Ambulatory Visit: Admitting: Internal Medicine

## 2025-02-22 ENCOUNTER — Ambulatory Visit
